# Patient Record
Sex: Female | Born: 1965 | Hispanic: No | Marital: Married | Smoking: Former smoker
Health system: Southern US, Community
[De-identification: ages and names within clinical notes are randomized; demographics above are authoritative.]

## PROBLEM LIST (undated history)

## (undated) DIAGNOSIS — T7840XA Allergy, unspecified, initial encounter: Secondary | ICD-10-CM

## (undated) DIAGNOSIS — Z860101 Personal history of adenomatous and serrated colon polyps: Secondary | ICD-10-CM

## (undated) DIAGNOSIS — R87619 Unspecified abnormal cytological findings in specimens from cervix uteri: Secondary | ICD-10-CM

## (undated) DIAGNOSIS — IMO0002 Reserved for concepts with insufficient information to code with codable children: Secondary | ICD-10-CM

## (undated) DIAGNOSIS — N879 Dysplasia of cervix uteri, unspecified: Secondary | ICD-10-CM

## (undated) DIAGNOSIS — F419 Anxiety disorder, unspecified: Secondary | ICD-10-CM

## (undated) DIAGNOSIS — R51 Headache: Secondary | ICD-10-CM

## (undated) DIAGNOSIS — Z298 Encounter for other specified prophylactic measures: Secondary | ICD-10-CM

## (undated) DIAGNOSIS — Z2989 Encounter for other specified prophylactic measures: Secondary | ICD-10-CM

## (undated) DIAGNOSIS — G43909 Migraine, unspecified, not intractable, without status migrainosus: Secondary | ICD-10-CM

## (undated) DIAGNOSIS — M810 Age-related osteoporosis without current pathological fracture: Secondary | ICD-10-CM

## (undated) HISTORY — DX: Migraine, unspecified, not intractable, without status migrainosus: G43.909

## (undated) HISTORY — DX: Encounter for other specified prophylactic measures: Z29.89

## (undated) HISTORY — DX: Reserved for concepts with insufficient information to code with codable children: IMO0002

## (undated) HISTORY — DX: Age-related osteoporosis without current pathological fracture: M81.0

## (undated) HISTORY — DX: Dysplasia of cervix uteri, unspecified: N87.9

## (undated) HISTORY — DX: Unspecified abnormal cytological findings in specimens from cervix uteri: R87.619

## (undated) HISTORY — DX: Allergy, unspecified, initial encounter: T78.40XA

## (undated) HISTORY — DX: Headache: R51

## (undated) HISTORY — DX: Personal history of adenomatous and serrated colon polyps: Z86.0101

## (undated) HISTORY — PX: OTHER SURGICAL HISTORY: SHX169

## (undated) HISTORY — DX: Anxiety disorder, unspecified: F41.9

## (undated) HISTORY — DX: Encounter for other specified prophylactic measures: Z29.8

---

## 1998-02-03 ENCOUNTER — Other Ambulatory Visit: Admission: RE | Admit: 1998-02-03 | Discharge: 1998-02-03 | Payer: Self-pay | Admitting: Obstetrics and Gynecology

## 1999-05-29 ENCOUNTER — Other Ambulatory Visit: Admission: RE | Admit: 1999-05-29 | Discharge: 1999-05-29 | Payer: Self-pay | Admitting: Obstetrics and Gynecology

## 2000-01-23 HISTORY — PX: OTHER SURGICAL HISTORY: SHX169

## 2000-07-02 ENCOUNTER — Other Ambulatory Visit: Admission: RE | Admit: 2000-07-02 | Discharge: 2000-07-02 | Payer: Self-pay | Admitting: Obstetrics and Gynecology

## 2000-11-13 ENCOUNTER — Ambulatory Visit (HOSPITAL_COMMUNITY): Admission: RE | Admit: 2000-11-13 | Discharge: 2000-11-13 | Payer: Self-pay | Admitting: Obstetrics and Gynecology

## 2002-09-16 ENCOUNTER — Encounter: Payer: Self-pay | Admitting: Otolaryngology

## 2002-09-16 ENCOUNTER — Encounter: Admission: RE | Admit: 2002-09-16 | Discharge: 2002-09-16 | Payer: Self-pay | Admitting: Otolaryngology

## 2005-01-29 ENCOUNTER — Ambulatory Visit: Payer: Self-pay | Admitting: Family Medicine

## 2008-03-17 ENCOUNTER — Ambulatory Visit: Payer: Self-pay | Admitting: Occupational Medicine

## 2008-06-25 ENCOUNTER — Other Ambulatory Visit: Admission: RE | Admit: 2008-06-25 | Discharge: 2008-06-25 | Payer: Self-pay | Admitting: Family Medicine

## 2008-06-25 ENCOUNTER — Encounter: Payer: Self-pay | Admitting: Family Medicine

## 2008-06-25 ENCOUNTER — Ambulatory Visit: Payer: Self-pay | Admitting: Family Medicine

## 2008-06-25 DIAGNOSIS — F909 Attention-deficit hyperactivity disorder, unspecified type: Secondary | ICD-10-CM | POA: Insufficient documentation

## 2008-06-29 LAB — CONVERTED CEMR LAB: Pap Smear: NORMAL

## 2008-07-06 ENCOUNTER — Encounter: Admission: RE | Admit: 2008-07-06 | Discharge: 2008-07-06 | Payer: Self-pay | Admitting: Family Medicine

## 2008-07-06 ENCOUNTER — Encounter: Payer: Self-pay | Admitting: Family Medicine

## 2008-07-07 LAB — CONVERTED CEMR LAB
ALT: 27 units/L (ref 0–35)
AST: 28 units/L (ref 0–37)
Alkaline Phosphatase: 63 units/L (ref 39–117)
CO2: 27 meq/L (ref 19–32)
TSH: 2.368 microintl units/mL (ref 0.350–4.500)
Total Bilirubin: 0.3 mg/dL (ref 0.3–1.2)
Total Protein: 7.1 g/dL (ref 6.0–8.3)

## 2008-10-18 ENCOUNTER — Telehealth: Payer: Self-pay | Admitting: Family Medicine

## 2008-11-19 ENCOUNTER — Ambulatory Visit: Payer: Self-pay | Admitting: Family Medicine

## 2008-11-20 ENCOUNTER — Encounter: Payer: Self-pay | Admitting: Family Medicine

## 2009-03-31 ENCOUNTER — Telehealth: Payer: Self-pay | Admitting: Family Medicine

## 2009-04-04 ENCOUNTER — Ambulatory Visit: Payer: Self-pay | Admitting: Occupational Medicine

## 2009-04-08 ENCOUNTER — Ambulatory Visit: Payer: Self-pay | Admitting: Family Medicine

## 2009-04-25 ENCOUNTER — Ambulatory Visit: Payer: Self-pay | Admitting: Family Medicine

## 2009-04-25 DIAGNOSIS — J309 Allergic rhinitis, unspecified: Secondary | ICD-10-CM | POA: Insufficient documentation

## 2009-05-17 ENCOUNTER — Telehealth: Payer: Self-pay | Admitting: Family Medicine

## 2009-05-26 ENCOUNTER — Telehealth: Payer: Self-pay | Admitting: Family Medicine

## 2009-07-29 ENCOUNTER — Telehealth: Payer: Self-pay | Admitting: Family Medicine

## 2009-09-08 ENCOUNTER — Ambulatory Visit: Payer: Self-pay | Admitting: Family Medicine

## 2009-09-08 DIAGNOSIS — G43009 Migraine without aura, not intractable, without status migrainosus: Secondary | ICD-10-CM | POA: Insufficient documentation

## 2009-09-30 ENCOUNTER — Telehealth: Payer: Self-pay | Admitting: Family Medicine

## 2009-11-08 ENCOUNTER — Telehealth: Payer: Self-pay | Admitting: Family Medicine

## 2009-12-09 ENCOUNTER — Ambulatory Visit: Payer: Self-pay | Admitting: Family Medicine

## 2009-12-22 ENCOUNTER — Telehealth: Payer: Self-pay | Admitting: Family Medicine

## 2010-01-25 ENCOUNTER — Telehealth: Payer: Self-pay | Admitting: Family Medicine

## 2010-02-21 NOTE — Progress Notes (Signed)
Summary: meds  Phone Note Call from Patient Call back at [phone]   Caller: Patient Call For: Nani Gasser MD Summary of Call: Pt called and states the sumatriptan made her feel really "weird" and she didnt like the way she felt while taking this med. If you have another recomendation for a diff med please send to [phone] Initial call taken by: Avon Gully CMA, Duncan Dull),  September 30, 2009 9:32 AM  Follow-up for Phone Call        We can try zomig. Follow-up by: Nani Gasser MD,  September 30, 2009 9:53 AM  Additional Follow-up for Phone Call Additional follow up Details #1::        Pt notified of MD instructions and that med sent Additional Follow-up by: Kathlene November,  September 30, 2009 10:02 AM    New/Updated Medications: ZOMIG 5 MG TABS (ZOLMITRIPTAN) Take 1 tablet by mouth once a day as needed for migraine. can repeat dose in 2 hours as needed. No more than 2 tabs in one day Prescriptions: ZOMIG 5 MG TABS (ZOLMITRIPTAN) Take 1 tablet by mouth once a day as needed for migraine. can repeat dose in 2 hours as needed. No more than 2 tabs in one day  #6 x 0   Entered and Authorized by:   Nani Gasser MD   Signed by:   Nani Gasser MD on 09/30/2009   Method used:   Electronically to        [facility]* (retail)       [address] [place], [place]  [address]       Ph: [phone]       Fax: [phone]   RxID:   [id number]

## 2010-02-21 NOTE — Progress Notes (Signed)
  Phone Note Refill Request Message from:  Patient on May 26, 2009 2:58 PM  Refills Requested: Medication #1:  VYVANSE 30 MG CAPS Take 1 tablet by mouth once a day   Supply Requested: 1 month Call when ready at [phone]   Method Requested: Pick up at Office Initial call taken by: Kathlene November,  May 26, 2009 2:59 PM    Prescriptions: VYVANSE 30 MG CAPS (LISDEXAMFETAMINE DIMESYLATE) Take 1 tablet by mouth once a day  #30 x 0   Entered and Authorized by:   Nani Gasser MD   Signed by:   Nani Gasser MD on 05/26/2009   Method used:   Print then Give to Patient   RxID:   [id number]

## 2010-02-21 NOTE — Progress Notes (Signed)
Summary: Still has H/A  Phone Note Call from Patient Call back at [phone]   Caller: Patient Call For: Nani Gasser MD Summary of Call: Has finished the antibiotic and still taking the omnaris. Still has a terrible H/A and you told her not to take anymore ibuprofen. Please advise as to what to do. Uses [facility] in [place] Initial call taken by: Kathlene November LPN,  December 22, 2009 1:54 PM  Follow-up for Phone Call        Can change ABX if still having nasal sxs. Is the pressure behind her eyes better and the post nasal drip or not? Also can try stopping the omnaris as can cause HA as well.  Follow-up by: Nani Gasser MD,  December 22, 2009 2:09 PM  Additional Follow-up for Phone Call Additional follow up Details #1::        Yes pressure behind eyes and temples. Post nasal drip has cleared Additional Follow-up by: Kathlene November LPN,  December 22, 2009 2:10 PM    Additional Follow-up for Phone Call Additional follow up Details #2::    OK rx sent. Stop omnaris to make sure not contributing to HA.  If stil having HA next week then make OV.  Follow-up by: Nani Gasser MD,  December 22, 2009 2:13 PM  Additional Follow-up for Phone Call Additional follow up Details #3:: Details for Additional Follow-up Action Taken: Pt notified of above instructions and med sent ot pharmacy Additional Follow-up by: Kathlene November LPN,  December 22, 2009 2:15 PM  New/Updated Medications: CEFDINIR 300 MG CAPS (CEFDINIR) Take 1 tablet by mouth two times a day for 10 days Prescriptions: CEFDINIR 300 MG CAPS (CEFDINIR) Take 1 tablet by mouth two times a day for 10 days  #20 x 0   Entered and Authorized by:   Nani Gasser MD   Signed by:   Nani Gasser MD on 12/22/2009   Method used:   Electronically to        [facility]* (retail)       [address] [place], [place]  [address]       Ph: [phone]       Fax: [phone]   RxID:   [id number]

## 2010-02-21 NOTE — Assessment & Plan Note (Signed)
Summary: Sinusitis, allergies   Vital Signs:  Patient profile:   45 year old female Height:      69 inches Weight:      140 pounds BMI:     20.75 O2 Sat:      99 % Temp:     98.4 degrees F oral Pulse rate:   76 / minute BP sitting:   114 / 70  (left arm) Cuff size:   regular  Vitals Entered By: Kathlene November (April 25, 2009 4:05 PM) CC: sinus and facial pressure, H/A for 3 weeks now. Used Mucinex and Advil Cold and Sinus   Primary Care Provider:  Nani Gasser MD  CC:  sinus and facial pressure and H/A for 3 weeks now. Used Mucinex and Advil Cold and Sinus.  History of Present Illness: sinus and facial pressure, H/A for 3 weeks now. Used Mucinex and Advil Cold and Sinus. Does have some year round allergies and uses nasal saline solution.  Throbbing, better when bends forward.  Mild congestion.  Has the ithcy watery eyes as well. Tried zyrtec and allegra.  Works for the [employer] but nto the HA.  Has taken alot of IBU this week and  now getting nauseated. Has also been using Affrin.   Had allergy shots for almost 4 years about 17 years ago and felt they really didn't help. knows mold is one of her triggers.   Current Medications (verified): 1)  Vyvanse 30 Mg Caps (Lisdexamfetamine Dimesylate) .... Take 1 Tablet By Mouth Once A Day  Allergies (verified): No Known Drug Allergies  Comments:  Nurse/Medical Assistant: The patient's medications and allergies were reviewed with the patient and were updated in the Medication and Allergy Lists. Kathlene November (April 25, 2009 4:07 PM)  Physical Exam  General:  Well-developed,well-nourished,in no acute distress; alert,appropriate and cooperative throughout examination Head:  Normocephalic and atraumatic without obvious abnormalities. No apparent alopecia or balding. Eyes:  Lids with midl edema.  Cleary watery eyes.   Ears:  External ear exam shows no significant lesions or deformities.  Otoscopic examination reveals clear canals, tympanic  membranes are intact bilaterally without bulging, retraction, inflammation or discharge. Hearing is grossly normal bilaterally. Nose:  External nasal examination shows no deformity or inflammation. Nasal mucosa are pink and moist without lesions or exudates. Mouth:  Oral mucosa and oropharynx without lesions or exudates.  Teeth in good repair. Neck:  No deformities, masses, or tenderness noted. Lungs:  Normal respiratory effort, chest expands symmetrically. Lungs are clear to auscultation, no crackles or wheezes. Heart:  Normal rate and regular rhythm. S1 and S2 normal without gallop, murmur, click, rub or other extra sounds. Skin:  no rashes.   Cervical Nodes:  No lymphadenopathy noted Psych:  Cognition and judgment appear intact. Alert and cooperative with normal attention span and concentration. No apparent delusions, illusions, hallucinations   Impression & Recommendations:  Problem # 1:  ALLERGIC RHINITIS (ICD-477.9)  Discussed use of allergy medications and environmental measures. REstart her oral antihistamine and can add the decongestant if would like.  Need s to stop the affrin and the IBU today. Will start omnaris and astepro for sxs relieve.  Call if not better in one week.   Her updated medication list for this problem includes:    Astepro 0.15 % Soln (Azelastine hcl) .Marland Kitchen... 1 spray in each nostril two times a day    Omnaris 50 Mcg/act Susp (Ciclesonide) .Marland Kitchen... 2 sprays in each nostril once a day.  Problem #  2:  SINUSITIS, ACUTE (ICD-461.9) Will treat for bacterial sinusitis with amoxicillin. Call if not better in one week.  The following medications were removed from the medication list:    Cephalexin 500 Mg Caps (Cephalexin) ..... One by mouth two times a day Her updated medication list for this problem includes:    Amoxicillin 500 Mg Cap (Amoxicillin) .Marland Kitchen... Take 1 capsule by mouth three times a day x 10 days    Astepro 0.15 % Soln (Azelastine hcl) .Marland Kitchen... 1 spray in each nostril  two times a day    Omnaris 50 Mcg/act Susp (Ciclesonide) .Marland Kitchen... 2 sprays in each nostril once a day.  Complete Medication List: 1)  Vyvanse 30 Mg Caps (Lisdexamfetamine dimesylate) .... Take 1 tablet by mouth once a day 2)  Amoxicillin 500 Mg Cap (Amoxicillin) .... Take 1 capsule by mouth three times a day x 10 days 3)  Astepro 0.15 % Soln (Azelastine hcl) .Marland Kitchen.. 1 spray in each nostril two times a day 4)  Omnaris 50 Mcg/act Susp (Ciclesonide) .... 2 sprays in each nostril once a day.  Patient Instructions: 1)  Omnaris: 2 squirts in each nostril once a day. Tilt chin foreward.  2)  Astepro : one squirt in each nostril twice a day.  3)  Start the zyrtec or allegra.  4)  Call me in one week if not better.  Prescriptions: AMOXICILLIN 500 MG CAP (AMOXICILLIN) Take 1 capsule by mouth three times a day X 10 days  #30 x 0   Entered and Authorized by:   Lacey Gasser MD   Signed by:   Lacey Gasser MD on 04/25/2009   Method used:   Electronically to        [facility]* (retail)       [address] [place], [place]  [address]       Ph: [phone]       Fax: [phone]   RxID:   [id number]

## 2010-02-21 NOTE — Assessment & Plan Note (Signed)
Summary: Sinusitis   Vital Signs:  Patient profile:   45 year old female Height:      69 inches Weight:      144 pounds Temp:     98.5 degrees F Pulse rate:   89 / minute BP sitting:   118 / 71  (right arm) Cuff size:   regular  Vitals Entered By: Avon Gully CMA, Duncan Dull) (December 09, 2009 3:14 PM) CC: sore throat on the left side, drainage, sinus pressurea and HA   Primary Care Provider:  Nani Gasser MD  CC:  sore throat on the left side, drainage, and sinus pressurea and HA.  History of Present Illness: Has been using her omnaris.  Started about 2 weeks ago with sevre nasal congestion. Now her throat is sore, + post nasal drip.   NO fever. No GI sxs.  Bilat frontal and pain behind her eyes. Using mucnex. No cough.   Current Medications (verified): 1)  Vyvanse 30 Mg Caps (Lisdexamfetamine Dimesylate) .... Take 1 Tablet By Mouth Once A Day 2)  Omnaris 50 Mcg/act Susp (Ciclesonide) .... 2 Sprays in Each Nostril Once A Day.  Allergies (verified): No Known Drug Allergies  Comments:  Nurse/Medical Assistant: The patient's medications and allergies were reviewed with the patient and were updated in the Medication and Allergy Lists. Avon Gully CMA, Duncan Dull) (December 09, 2009 3:15 PM)  Physical Exam  General:  Well-developed,well-nourished,in no acute distress; alert,appropriate and cooperative throughout examination Head:  Normocephalic and atraumatic without obvious abnormalities. No apparent alopecia or balding. Eyes:  No corneal or conjunctival inflammation noted. EOMI. Perrla. Ears:  External ear exam shows no significant lesions or deformities.  Otoscopic examination reveals clear canals, tympanic membranes are intact bilaterally without bulging, retraction, inflammation or discharge. Hearing is grossly normal bilaterally. Nose:  External nasal examination shows no deformity or inflammation.  Mouth:  Oral mucosa and oropharynx without lesions or exudates.   Teeth in good repair. Neck:  No deformities, masses, or tenderness noted. Lungs:  Normal respiratory effort, chest expands symmetrically. Lungs are clear to auscultation, no crackles or wheezes. Heart:  Normal rate and regular rhythm. S1 and S2 normal without gallop, murmur, click, rub or other extra sounds. Skin:  no rashes.   Cervical Nodes:  No lymphadenopathy noted Psych:  Cognition and judgment appear intact. Alert and cooperative with normal attention span and concentration. No apparent delusions, illusions, hallucinations   Impression & Recommendations:  Problem # 1:  SINUSITIS - ACUTE-NOS (ICD-461.9)  Her updated medication list for this problem includes:    Omnaris 50 Mcg/act Susp (Ciclesonide) .Marland Kitchen... 2 sprays in each nostril once a day.    Amoxicillin 875 Mg Tabs (Amoxicillin) .Marland Kitchen... Take 1 tablet by mouth two times a day for 10 day  Instructed on treatment. Call if symptoms persist or worsen.   Complete Medication List: 1)  Vyvanse 30 Mg Caps (Lisdexamfetamine dimesylate) .... Take 1 tablet by mouth once a day 2)  Omnaris 50 Mcg/act Susp (Ciclesonide) .... 2 sprays in each nostril once a day. 3)  Amoxicillin 875 Mg Tabs (Amoxicillin) .... Take 1 tablet by mouth two times a day for 10 day  Patient Instructions: 1)  Call if not better in one week 2)  Can add nasal saline rinse once daily if would like.  Prescriptions: AMOXICILLIN 875 MG TABS (AMOXICILLIN) Take 1 tablet by mouth two times a day for 10 day  #20 x 0   Entered and Authorized by:   Nani Gasser MD  Signed by:   Nani Gasser MD on 12/09/2009   Method used:   Electronically to        [facility]* (retail)       [address] [place], [place]  [address]       Ph: [phone]       Fax: [phone]   RxID:   [id number]    Orders Added: 1)  Est. Patient Level III [25366]

## 2010-02-21 NOTE — Progress Notes (Signed)
Summary: med. request   Phone Note Refill Request Message from:  Patient on July 29, 2009 3:06 PM  Refills Requested: Medication #1:  VYVANSE 30 MG CAPS Take 1 tablet by mouth once a day Call patient at [phone] and let her know when this med is ready for pick up... She had requested it at the beginning of the week on kim's voice mail and didn't realize Selena Batten is not here this week. Thanks.Michaelle Copas  July 29, 2009 3:08 PM   Initial call taken by: Michaelle Copas,  July 29, 2009 3:08 PM    Prescriptions: VYVANSE 30 MG CAPS (LISDEXAMFETAMINE DIMESYLATE) Take 1 tablet by mouth once a day  #30 x 0   Entered and Authorized by:   Seymour Bars DO   Signed by:   Seymour Bars DO on 07/29/2009   Method used:   Print then Give to Patient   RxID:   [id number]

## 2010-02-21 NOTE — Progress Notes (Signed)
Summary: refill  Phone Note Refill Request Message from:  Patient on March 31, 2009 3:22 PM  Refills Requested: Medication #1:  VYVANSE 30 MG CAPS Take 1 tablet by mouth once a day   Supply Requested: 1 month  Method Requested: Pick up at Office Initial call taken by: Kathlene November,  March 31, 2009 3:23 PM    Prescriptions: VYVANSE 30 MG CAPS (LISDEXAMFETAMINE DIMESYLATE) Take 1 tablet by mouth once a day  #30 x 0   Entered and Authorized by:   Nani Gasser MD   Signed by:   Nani Gasser MD on 03/31/2009   Method used:   Printed then faxed to ...       [address] [phone]* (retail)       [address] [place], [place]  [address]       Ph: [phone]       Fax: [phone]   RxID:   [id number]

## 2010-02-21 NOTE — Assessment & Plan Note (Signed)
Summary: migraines   Vital Signs:  Patient profile:   45 year old female Height:      69 inches Weight:      143 pounds Pulse rate:   78 / minute BP sitting:   116 / 69  (left arm) Cuff size:   regular  Vitals Entered By: Kathlene November (September 08, 2009 1:30 PM) CC: followup H/A. Having 3 a week. Brought H/A diary, Headache   Primary Care Provider:  Nani Gasser MD  CC:  followup H/A. Having 3 a week. Brought H/A diary and Headache.  History of Present Illness: Felt like having chronic Sinus HA. Sine the 21st of July has had 11 HA's. Brought in a HA diary with her.  2 of the HA made her nauseated adn sick.  Usually  from nose to teh occiput.  Feels better to put pressure on her head. They do throb. Has been trying the allergy meds (omnaris, prednisone and ABX) and the HA are not better. No more nasal congestion. Most  HA are around an 8/10.  Yesterday had HA and took and OTC medicine and then again later in the day.  Denies any aura sxss. Light and sound senitivity. Only uses OTC meds about twice a week.  Sleeps well.  Wine maybe once  a month. No chocolate. dosn't feel stressed right now. Drinkon caffeinated drink a day. Regular exercise.   Current Medications (verified): 1)  Vyvanse 30 Mg Caps (Lisdexamfetamine Dimesylate) .... Take 1 Tablet By Mouth Once A Day 2)  Omnaris 50 Mcg/act Susp (Ciclesonide) .... 2 Sprays in Each Nostril Once A Day.  Allergies (verified): No Known Drug Allergies  Comments:  Nurse/Medical Assistant: The patient's medications and allergies were reviewed with the patient and were updated in the Medication and Allergy Lists. Kathlene November (September 08, 2009 1:32 PM)  Family History: _Adopted Son with HA  Social History: Reviewed history from 06/25/2008 and no changes required. 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  Married to [employer] with 2 children.  Quit smoking 5 yrs ago, occ EtOH, 2 caffeinated drinks per day, Yoga for  exercise.  Physical Exam  General:  Well-developed,well-nourished,in no acute distress; alert,appropriate and cooperative throughout examination Neurologic:  alert & oriented X3, cranial nerves II-XII intact, and gait normal.   Psych:  Oriented X3.     Impression & Recommendations:  Problem # 1:  COMMON MIGRAINE (ICD-346.10) Assessment New Discussed that I do think she has migraines. Talked about prophylaxis since she is having so much frequency and resuce meds. OTC do work well for her but I am fearful or getting rebounds it using too frequently.  Dsicused risks and benefits and how to properly use th emedications. Will start with nortryptiline nightly and use imitrex for rescure for her more severe HA F/U in 6 weeks. she really already has low stress, great diet, works out regularly. Can work on dec caffeine overall. She already avoid alcohol and chocolate. Can try avoiding nuts and look for triggers.  Her updated medication list for this problem includes:    Sumatriptan Succinate 100 Mg Tabs (Sumatriptan succinate) ..... One by mouth as needed migraine. can repat the dose in 2 hours if needed.  Complete Medication List: 1)  Vyvanse 30 Mg Caps (Lisdexamfetamine dimesylate) .... Take 1 tablet by mouth once a day 2)  Omnaris 50 Mcg/act Susp (Ciclesonide) .... 2 sprays in each nostril once a day. 3)  Nortriptyline Hcl 25 Mg Caps (Nortriptyline hcl) .Marland KitchenMarland KitchenMarland Kitchen  Take 1 tablet by mouth once a day at bedtime 4)  Sumatriptan Succinate 100 Mg Tabs (Sumatriptan succinate) .... One by mouth as needed migraine. can repat the dose in 2 hours if needed.  Patient Instructions: 1)  Start the nortryptiline at bedtime. Can use the sumatriptan for acute headaches.  2)  Please schedule a follow-up appointment in 4-6 weeks.  Prescriptions: SUMATRIPTAN SUCCINATE 100 MG TABS (SUMATRIPTAN SUCCINATE) one by mouth as needed migraine. Can repat the dose in 2 hours if needed.  #6 x 0   Entered and Authorized by:    Nani Gasser MD   Signed by:   Nani Gasser MD on 09/08/2009   Method used:   Electronically to        [facility]* (retail)       [address] [place], [place]  [address]       Ph: [phone]       Fax: [phone]   RxID:   [id number] NORTRIPTYLINE HCL 25 MG CAPS (NORTRIPTYLINE HCL) Take 1 tablet by mouth once a day at bedtime  #30 x 1   Entered and Authorized by:   Nani Gasser MD   Signed by:   Nani Gasser MD on 09/08/2009   Method used:   Electronically to        [facility]* (retail)       [address] [place], [place]  [address]       Ph: [phone]       Fax: [phone]   RxID:   [id number]

## 2010-02-21 NOTE — Progress Notes (Signed)
Summary: refill vyvanse  Phone Note Refill Request Call back at [phone] Message from:  Patient on November 08, 2009 9:13 AM  Refills Requested: Medication #1:  VYVANSE 30 MG CAPS Take 1 tablet by mouth once a day   Supply Requested: 1 month Please call pt at [phone] when ready to pick up   Method Requested: Pick up at Office Initial call taken by: Kathlene November LPN,  November 08, 2009 9:13 AM  Follow-up for Phone Call        Pt notified ready to pick up. KJ LPN Follow-up by: Kathlene November LPN,  November 08, 2009 10:29 AM    Prescriptions: VYVANSE 30 MG CAPS (LISDEXAMFETAMINE DIMESYLATE) Take 1 tablet by mouth once a day  #30 x 0   Entered and Authorized by:   Nani Gasser MD   Signed by:   Nani Gasser MD on 11/08/2009   Method used:   Print then Give to Patient   RxID:   [id number]

## 2010-02-21 NOTE — Assessment & Plan Note (Signed)
Summary: DOG BITE ON LIP   Vital Signs:  Patient Profile:   45 Years Old Female CC:      Dog bite upper lip Height:     69 inches Weight:      140 pounds O2 Sat:      100 % O2 treatment:    Room Air Temp:     97.9 degrees F oral Pulse rate:   71 / minute Pulse rhythm:   regular Resp:     16 per minute BP sitting:   111 / 75  (right arm) Cuff size:   regular  Vitals Entered By: Emilio Math (April 04, 2009 12:58 PM)                  Current Allergies: No known allergies History of Present Illness Chief Complaint: Dog bite upper lip History of Present Illness: Very pleastnt lady was playing with her 85 lb lab and his tooth accidentally hit her left upper lip.  Presents with a 1 cm lacertion to her upper lip left side.   Tdap is up to date.    REVIEW OF SYSTEMS Constitutional Symptoms      Denies fever, chills, night sweats, weight loss, weight gain, and fatigue.  Eyes       Denies change in vision, eye pain, eye discharge, glasses, contact lenses, and eye surgery. Ear/Nose/Throat/Mouth       Denies hearing loss/aids, change in hearing, ear pain, ear discharge, dizziness, frequent runny nose, frequent nose bleeds, sinus problems, sore throat, hoarseness, and tooth pain or bleeding.  Respiratory       Denies dry cough, productive cough, wheezing, shortness of breath, asthma, bronchitis, and emphysema/COPD.  Cardiovascular       Denies murmurs, chest pain, and tires easily with exhertion.    Gastrointestinal       Denies stomach pain, nausea/vomiting, diarrhea, constipation, blood in bowel movements, and indigestion. Genitourniary       Denies painful urination, kidney stones, and loss of urinary control. Neurological       Denies paralysis, seizures, and fainting/blackouts. Musculoskeletal       Denies muscle pain, joint pain, joint stiffness, decreased range of motion, redness, swelling, muscle weakness, and gout.      Comments: LipLac Skin       Denies bruising,  unusual mles/lumps or sores, and hair/skin or nail changes.  Psych       Denies mood changes, temper/anger issues, anxiety/stress, speech problems, depression, and sleep problems.  Past History:  Past Medical History: Reviewed history from 10/30/2005 and no changes required. Hx allergy shots  Past Surgical History: Reviewed history from 06/25/2008 and no changes required. Gyn surgery 2002  Family History: Reviewed history from 03/17/2008 and no changes required. 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  Quit smoking 5 yrs ago, occ EtOH, 2 caffeinated drinks per day, Yoga for exercise. Physical Exam General appearance: well developed, well nourished, no acute distress Oral/Pharynx: 1 cm lacertion to her upper lip left side.   Fairly superficial.  Gaping noted.   Nothing through and through.  Chest/Lungs: no rales, wheezes, or rhonchi bilateral, breath sounds equal without effort Heart: regular rate and  rhythm, no murmur Assessment New Problems: WOUND, OPEN, HEAD/NECK/TRUNK, UNSPEC. (ICD-879.8)   Plan New Orders: Repair Superfical Wound(s) 2.5cm or< (face,ears,eyelids,nose,lips,mm) [12011] Planning Comments:   I anestizied the wound  with 2% lidocaine Closed the wound with 3 interupted sutures 6.0 nylon Follow up Friday for suture removal.   The patient and/or caregiver has been counseled thoroughly with regard to medications prescribed including dosage, schedule, interactions, rationale for use, and possible side effects and they verbalize understanding.  Diagnoses and expected course of recovery discussed and will return if not improved as expected or if the condition worsens. Patient and/or caregiver verbalized understanding.

## 2010-02-21 NOTE — Assessment & Plan Note (Signed)
Summary: Suture Removal  followup  Suture removal: left upper lip BP 107/66 HR 78 Temp 97.0 RR: 16  Subjective:  Patient has no complaints  Objective: Laceration left upper lip:  healing well.  Sutures removed.  No evidence infection.  Wound edges appear somewhat friable.  Assessment:  ENCOUNTER FOR REMOVAL OF SUTURES (ICD-V58.32)   Plan:   Reinforced wound with Dermabond Dermabond info sheet given

## 2010-02-21 NOTE — Progress Notes (Signed)
Summary: headaches  Phone Note Call from Patient Call back at [phone]   Caller: Patient Call For: Lacey Gasser MD Summary of Call: Pt calls and states given antibiotic and 2 nasal sprays for sinus infection at her appointment- still having a terrible Headache and wanted to know if you could call in the Omnaris nasal spray and something for her headaches since you told her not to take the ibuprofen and doesn't want to keep taking so much Tylenol either Initial call taken by: Kathlene November,  May 17, 2009 11:33 AM  Follow-up for Phone Call        Are her sinus sxs better?  Will call in prednisone for 5 days.  DUring those 5 days don't take any IBU, Aleve, or Tylenol.  Hopefully this will break the HA cycyle. If not then come back in for OV>  Follow-up by: Lacey Gasser MD,  May 17, 2009 11:45 AM  Additional Follow-up for Phone Call Additional follow up Details #1::        Pt notified of MD instructions. Additional Follow-up by: Kathlene November,  May 17, 2009 11:54 AM    New/Updated Medications: PREDNISONE 10 MG TABS (PREDNISONE) 8 tabs today, 6 tabs by mouth Day 2, 4 tabs Day 3, 2 tabs Day 4, 1 tab Day 5, then stop. Prescriptions: PREDNISONE 10 MG TABS (PREDNISONE) 8 tabs today, 6 tabs by mouth Day 2, 4 tabs Day 3, 2 tabs Day 4, 1 tab Day 5, then stop.  #21 x 0   Entered and Authorized by:   Lacey Gasser MD   Signed by:   Lacey Gasser MD on 05/17/2009   Method used:   Electronically to        [facility]* (retail)       [address] [place], [place]  [address]       Ph: [phone]       Fax: [phone]   RxID:   [id number] OMNARIS 50 MCG/ACT SUSP (CICLESONIDE) 2 sprays in each nostril once a day.  #1 x 3   Entered and Authorized by:   Lacey Gasser MD   Signed by:   Lacey Gasser MD on 05/17/2009   Method used:   Electronically to        [facility]* (retail)       [address] [place], [place]  [address]       Ph:  [phone]       Fax: [phone]   RxID:   [id number]

## 2010-02-23 NOTE — Progress Notes (Signed)
Summary: Vyvanse refill  Phone Note Refill Request Message from:  Patient on January 25, 2010 10:49 AM  Refills Requested: Medication #1:  VYVANSE 30 MG CAPS Take 1 tablet by mouth once a day   Supply Requested: 1 month  Method Requested: Pick up at Office Initial call taken by: Kathlene November LPN,  January 25, 2010 10:49 AM    Prescriptions: VYVANSE 30 MG CAPS (LISDEXAMFETAMINE DIMESYLATE) Take 1 tablet by mouth once a day  #30 x 0   Entered and Authorized by:   Nani Gasser MD   Signed by:   Nani Gasser MD on 01/25/2010   Method used:   Print then Give to Patient   RxID:   [id number]

## 2010-04-03 ENCOUNTER — Ambulatory Visit: Payer: Self-pay | Admitting: Family Medicine

## 2010-04-04 ENCOUNTER — Ambulatory Visit (INDEPENDENT_AMBULATORY_CARE_PROVIDER_SITE_OTHER): Payer: PRIVATE HEALTH INSURANCE | Admitting: Family Medicine

## 2010-04-04 ENCOUNTER — Encounter: Payer: Self-pay | Admitting: Family Medicine

## 2010-04-04 DIAGNOSIS — N943 Premenstrual tension syndrome: Secondary | ICD-10-CM

## 2010-04-04 DIAGNOSIS — N920 Excessive and frequent menstruation with regular cycle: Secondary | ICD-10-CM | POA: Insufficient documentation

## 2010-04-04 DIAGNOSIS — F3281 Premenstrual dysphoric disorder: Secondary | ICD-10-CM | POA: Insufficient documentation

## 2010-04-11 NOTE — Assessment & Plan Note (Signed)
Summary: trouble with menses,  mood   Vital Signs:  Patient profile:   45 year old female Height:      69 inches Weight:      142 pounds Pulse rate:   90 / minute BP sitting:   132 / 81  (right arm) Cuff size:   regular  Vitals Entered By: Avon Gully CMA, Duncan Dull) (April 04, 2010 2:56 PM) CC: heavy periods, and very irritable   Primary Care Provider:  Nani Gasser MD  CC:  heavy periods and and very irritable.  History of Present Illness: heavy periods, and very irritable.  In December started to get more irritable.  using pad and tampon becuase passing blood clots. Bleeding for about 4- 7 days. Lot sof cramping.  Very ittitable the week before and after her periods. Sister takes Buspar for PMS.  Did stop her ADD med just to see if felt better and ther was no change.  Feelsll ike might have a nervous breakdown.  she says her husband is also getting irritated with her and recommended she come speak to the physician.  Current Medications (verified): 1)  Vyvanse 30 Mg Caps (Lisdexamfetamine Dimesylate) .... Take 1 Tablet By Mouth Once A Day  Allergies (verified): 1)  Sumatriptan Succinate (Sumatriptan Succinate) 2)  Zomig (Zolmitriptan)  Comments:  Nurse/Medical Assistant: The patient's medications and allergies were reviewed with the patient and were updated in the Medication and Allergy Lists. 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  Married to [employer] with 2 children.  Quit smoking 5 yrs ago, occ EtOH, 2 caffeinated drinks per day, Yoga for exercise.  Physical Exam  General:  Well-developed,well-nourished,in no acute distress; alert,appropriate and cooperative throughout examination   Impression &  Recommendations:  Problem # 1:  EXCESSIVE MENSTRUAL BLEEDING (ICD-626.2) Will refer to GYN.   her last Pap smear was normal in June of 2010. She was not due to repeat  her Pap until June of this year. Because she is over 35 with a recent increase in heaviness of her periods she might be a candidate for an endometrial biopsy to rule out hyperplasia. I will leave that to GYN. Certainly they can go ahead and do a Pap smear while she is there as well as STD testing. She is low risk for STI's. 25 minutes spent face-to-face counseling about these 2 conditions.  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 I discussed that the typical treatment as SSRIs. I typically does favor BuSpar because of its potential side effects. We also discussed that she could consider hormone therapy as well such as oral contraceptives or an IUD. since she is having symptoms 3 weeks out of the month I recommend we will start with a trial of an SSRI. We discussed the potential side effects and risks of these medications including suicidality. we will start low dose and follow her up in  4-6 weeks to see how she's doing.  Complete Medication List: 1)  Vyvanse 30 Mg Caps (Lisdexamfetamine dimesylate) .... Take 1 tablet by mouth once a day 2)  Fluoxetine Hcl 20 Mg Tabs (Fluoxetine hcl) .... 1/2 tab daily by mouth for one week, then increase to whole tab.  Patient Instructions:  1)  Please schedule a follow-up appointment in 4-6 weeks.  Prescriptions: VYVANSE 30 MG CAPS (LISDEXAMFETAMINE DIMESYLATE) Take 1 tablet by mouth once a day  #30 x 0   Entered and Authorized by:   Nani Gasser MD   Signed by:   Nani Gasser MD on 04/04/2010   Method used:   Print then Give to Patient   RxID:   [id number] FLUOXETINE HCL 20 MG TABS (FLUOXETINE HCL) 1/2 tab daily by mouth for one week, then increase to whole tab.  #30 x 1   Entered and Authorized by:   Nani Gasser  MD   Signed by:   Nani Gasser MD on 04/04/2010   Method used:   Electronically to        [facility]* (retail)       [address] [place], [place]  [address]       Ph: [phone]       Fax: [phone]   RxID:   [id number]    Orders Added: 1)  Est. Patient Level IV [94854]

## 2010-04-17 ENCOUNTER — Telehealth: Payer: Self-pay | Admitting: Family Medicine

## 2010-04-17 DIAGNOSIS — N920 Excessive and frequent menstruation with regular cycle: Secondary | ICD-10-CM

## 2010-04-17 NOTE — Telephone Encounter (Signed)
It was printed adn given to hre at the OV. It wasn't called in. It was on the same rx as the fluoxetine. We can call the pharmacy to verify.

## 2010-04-17 NOTE — Telephone Encounter (Signed)
Pt called and states we were going to set her up with a GYN for an issue and also need rx for vyvanse because it was called in with other rx and pt needs to come pick it up

## 2010-04-19 MED ORDER — LISDEXAMFETAMINE DIMESYLATE 30 MG PO CAPS: 30.0000 mg | ORAL_CAPSULE | ORAL | Status: DC

## 2010-04-19 NOTE — Telephone Encounter (Signed)
Called pharm and they did not have rx for vyvanse and they state they filled the fluoxetine because it was sent thru escripts

## 2010-04-19 NOTE — Telephone Encounter (Signed)
LMOM

## 2010-04-19 NOTE — Telephone Encounter (Signed)
Ok to pick up vyvanse rx.

## 2010-05-09 ENCOUNTER — Encounter: Payer: PRIVATE HEALTH INSURANCE | Admitting: Obstetrics & Gynecology

## 2010-06-01 ENCOUNTER — Ambulatory Visit (INDEPENDENT_AMBULATORY_CARE_PROVIDER_SITE_OTHER): Payer: PRIVATE HEALTH INSURANCE | Admitting: Family Medicine

## 2010-06-01 VITALS — BP 110/75 | HR 49 | Ht 69.5 in | Wt 140.0 lb

## 2010-06-01 DIAGNOSIS — N63 Unspecified lump in unspecified breast: Secondary | ICD-10-CM

## 2010-06-01 NOTE — Progress Notes (Signed)
  Subjective:    Patient ID: Lacey Jensen, female    DOB: 12/26/1965, 45 y.o.   MRN: [id number]  HPI Here because she noticed a lump in her right breast about 2 days ago. Not tender or painful.  Says usually checks her breast after her periods and didn't remember feeling anything last time.  She is worried about it.  No family or personal hx of brCa. Had a screening mammo at age 80 at [place].  No rash or fever.    Review of Systems     Objective:   Physical Exam  Pulmonary/Chest:         approx 1.5 x 2.0 cm mobile firm lesion in thr right breast in the 7 o'clock position. Mild tender with palpation. No overlying rash.           Assessment & Plan:  Breast lump-most likely a breast cyst. Will refer to the breast Center in [place] for further evaluation with ultrasound diagnostic mammogram since she is 45 years old. She is not having pain and did not recommend any pain regimen. We will call her as soon as we have the results.

## 2010-06-01 NOTE — Patient Instructions (Signed)
[facility] should call you with the appointment.

## 2010-06-02 ENCOUNTER — Encounter: Payer: Self-pay | Admitting: Family Medicine

## 2010-06-07 ENCOUNTER — Other Ambulatory Visit: Payer: Self-pay | Admitting: Family Medicine

## 2010-06-07 ENCOUNTER — Ambulatory Visit
Admission: RE | Admit: 2010-06-07 | Discharge: 2010-06-07 | Disposition: A | Discharge status: Home / Self Care | Payer: PRIVATE HEALTH INSURANCE | Source: Ambulatory Visit | Attending: Family Medicine | Admitting: Family Medicine

## 2010-06-07 DIAGNOSIS — N63 Unspecified lump in unspecified breast: Secondary | ICD-10-CM

## 2010-06-08 ENCOUNTER — Telehealth: Payer: Self-pay | Admitting: Family Medicine

## 2010-06-08 NOTE — Telephone Encounter (Signed)
Left message on cell with results.

## 2010-06-08 NOTE — Telephone Encounter (Signed)
Call pt: mamm shows looks like a cyst. They recommend repeat the study in 6 months to make sure no changes.

## 2010-06-09 NOTE — Op Note (Signed)
[facility]  Patient:    Lacey Jensen, Lacey Jensen Visit Number: [id number] MRN: [id number]          Service Type: DSU Location: [facility] Attending Physician:  Wandalee Ferdinand Dictated by:   Rudy Jew Ashley Royalty, M.D. Proc. Date: 11/13/00 Admit Date:  11/13/2000                             Operative Report  PREOPERATIVE DIAGNOSES:       Desire for attempt at permanent sterilization.  POSTOPERATIVE DIAGNOSES:      1. Desire for attempt at permanent                                  sterilization.                               2. 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  PROCEDURE:                    1. Diagnostic/operative laparoscopy.                               2. Bilateral tubal sterilization procedure                                  (Falope rings).                               3. Fulguration of pigmented lesion of posterior                                  cul-de-sac.  SURGEON:                      Rudy Jew. Ashley Royalty, M.D.  ANESTHESIA:                   General endotracheal.  ESTIMATED BLOOD LOSS:         Less than 50 cc.  COMPLICATIONS:                None.  PACKS AND DRAINS:             None.  PROCEDURE:                    Patient was taken to the operating room and placed in the dorsal supine position.  After adequate general anesthesia was administered she was placed in the lithotomy position and prepped and draped in the usual manner for abdominal and vaginal surgery.  Posterior weighted retractor was placed per vagina.  The anterior lip of the cervix was grasped with a single tooth tenaculum.  Jarco uterine manipulator was placed per cervix and held in place with tenaculum.  Bladder was drained with a Red rubber catheter.  Next, approximately 3 cc of 0.25% Marcaine was instilled into the inferior aspect  of the umbilicus and suprapubic area for later incisions.  Incision was made in the umbilicus of  approximately 1.2 cm in the longitudinal plane.  Subcutaneous tissues were sharply dissected.  The disposable size 10-11 trocar was placed into the abdominal cavity.  Location was verified by placement of the laparoscope.  There was no evidence of any trauma.  Next, a pneumoperitoneum was created with CO2 and maintained throughout the procedure.  A size 7 mm Falope ring trocar was then placed in the midline suprapubically.  Direct visualization and transillumination techniques were used in order to minimize the chance of any trauma.  The pelvis was thoroughly surveyed.  The uterus was normal size, shape, and contour without evidence of any endometriosis or fibroids.  The fallopian tubes were normal size, shape, length bilaterally with luxurian fimbria.  The left ovary was normal size with a stigma present on it.  The right ovary was normal size without evidence of any cysts or endometriosis.  Anterior cul-de-sac was clear.  In the posterior cul-de-sac there was a solitary approximately 3-4 mm pigmented area consistent with, but not diagnostic of, endometriosis.  The remainder of the peritoneal surfaces were smooth and glistening.  Next, the right fallopian tube was grasped with the Falope ring applicator in the distal isthmic to proximal ampullary portion.  A Falope ring was applied without difficulty.  Excellent knuckle of tube was noted to be contained within the ring.  Excellent blanching of tissue was noted.  The identical procedure was done on the left side at the identical location on the fallopian tube.  An excellent knuckle of tube was noted to be contained within the ring.  Excellent blanching of tissue was noted.  Appropriate photos were obtained before and after.  At this point the abdominal instruments were removed and pneumoperitoneum evacuated.  Fascial defect closed with 0 Vicryl in an interrupted fashion. The skin was closed with 3-0 Chromic in a subcuticular fashion.   Approximately 5 additional cc of 0.25% Marcaine was instilled.  The vaginal instruments were removed, hemostasis noted, and the procedure terminated.  Patient was taken to the recovery room in excellent condition. Dictated by:   Rudy Jew Ashley Royalty, M.D. Attending Physician:  Wandalee Ferdinand DD:  11/13/00 TD:  11/14/00 Job: [id number]

## 2010-06-09 NOTE — H&P (Signed)
[facility]  Patient:    KETURA, SIREK Visit Number: [id number] MRN: [id number]          Service Type: DSU Location: [facility] Attending Physician:  Wandalee Ferdinand Dictated by:   Rudy Jew Ashley Royalty, M.D.                           History and Physical  HISTORY OF PRESENT ILLNESS:   This is a 45 year old gravida 3, para 2, abortus 1, who states a desire for attempt at permanent sterilization.  She is also status post cold-knife conization.  Recent Pap smears have been negative.  MEDICATIONS:                  None.  PAST MEDICAL HISTORY:         Negative.  PAST SURGICAL HISTORY:        1. Cold-knife conization.                               2. Therapeutic AB.  ALLERGIES:                    None.  FAMILY HISTORY:               Positive for uterine cancer (vague history).  SOCIAL HISTORY:               The patient denies use of tobacco or alcohol.  REVIEW OF SYSTEMS:            Noncontributory.  PHYSICAL EXAMINATION:  GENERAL:                      Well-developed, well-nourished, pleasant white female in no acute distress.  VITAL SIGNS:                  Afebrile, vital signs stable.  SKIN:                         Warm and dry, without lesions.  LYMPH:                        No supraclavicular, cervical, or inguinal adenopathy.  HEENT:                        Normocephalic.  NECK:                         Supple.  Without thyromegaly.  CHEST:                        Clear.  LUNGS:                        Clear.  CARDIAC:                      Regular rate and rhythm without murmurs, gallops, or rubs.  BREASTS:                      Deferred.  ABDOMEN:                      Soft and nontender.  Without masses or organomegaly.  Bowel sounds  are active.  MUSCULOSKELETAL:              Full range of motion.  Without edema, cyanosis, or CVA tenderness.  PELVIC:                       Deferred until examination under  anesthesia.  IMPRESSION:                   Desire for attempt at permanent surgical sterilization.  PLAN:                         Laparoscopic bilateral tubal sterilization procedure.  Risks, benefits, complications, and alternatives were fully discussed with the patient.  Permanency and failure rate of various techniques including, but not limited to ______, minilaparotomy, and partial salpingectomy and Falope rings, were discussed and accepted.  Questions invited and answered. Dictated by:   Rudy Jew Ashley Royalty, M.D. Attending Physician:  Wandalee Ferdinand DD:  11/13/00 TD:  11/13/00 Job: [id number]

## 2010-07-17 ENCOUNTER — Telehealth: Payer: Self-pay | Admitting: Family Medicine

## 2010-07-17 NOTE — Telephone Encounter (Signed)
Pt called and left her name and number for triage nurse to call her.  Plan:  Pt call returned.  LMOM to call back at her convenience. Jarvis Newcomer, LPN [facility]

## 2010-07-18 ENCOUNTER — Telehealth: Payer: Self-pay | Admitting: Family Medicine

## 2010-07-18 MED ORDER — FLUOXETINE HCL 20 MG PO TABS: 20.0000 mg | ORAL_TABLET | Freq: Every day | ORAL | Status: DC

## 2010-07-18 MED ORDER — LISDEXAMFETAMINE DIMESYLATE 30 MG PO CAPS: 30.0000 mg | ORAL_CAPSULE | ORAL | Status: DC

## 2010-07-18 NOTE — Telephone Encounter (Signed)
Pt needs refill for vyvanse and fluoxetine 20 mg.  Fluoxetine sent to the pharmacy for # 30/2 refills, and vyvanse was printed out for sig from Dr. Cathey Endow.  Dr. Linford Arnold out of office.  Will get sig on Vyvanse and place up front for pt to pup.  Will check on the status of pt. Referral, and pt will ask about the referral today around 4 pm when she picks up her vyvanse prescription. Jarvis Newcomer, LPN [facility]  Victorino Dike please check on the status of pt referral.  She will ask you about when she comes to pup her script at 4 pm today. Jarvis Newcomer, LPN [facility]

## 2010-08-22 ENCOUNTER — Encounter: Payer: PRIVATE HEALTH INSURANCE | Admitting: Obstetrics & Gynecology

## 2010-09-12 ENCOUNTER — Encounter: Payer: PRIVATE HEALTH INSURANCE | Admitting: Obstetrics & Gynecology

## 2010-09-14 ENCOUNTER — Telehealth: Payer: Self-pay | Admitting: Family Medicine

## 2010-09-14 MED ORDER — LISDEXAMFETAMINE DIMESYLATE 30 MG PO CAPS: 30.0000 mg | ORAL_CAPSULE | ORAL | Status: DC

## 2010-09-14 NOTE — Telephone Encounter (Signed)
Pt called and needs refill of her vyvanse 30 mg PO QD.  Recent office visit on file. Plan:  Vyvanse 30 mg PO QD #30/0 refills printed out and provider to sign.  Pt will be notified to pup the script. Jarvis Newcomer, LPN [facility]'

## 2010-10-04 ENCOUNTER — Other Ambulatory Visit: Payer: Self-pay | Admitting: Obstetrics & Gynecology

## 2010-10-04 ENCOUNTER — Ambulatory Visit (INDEPENDENT_AMBULATORY_CARE_PROVIDER_SITE_OTHER): Payer: PRIVATE HEALTH INSURANCE | Admitting: Obstetrics & Gynecology

## 2010-10-04 ENCOUNTER — Encounter: Payer: Self-pay | Admitting: Obstetrics & Gynecology

## 2010-10-04 VITALS — BP 121/75 | HR 80 | Temp 97.5°F | Resp 12 | Ht 69.0 in | Wt 144.0 lb

## 2010-10-04 DIAGNOSIS — N920 Excessive and frequent menstruation with regular cycle: Secondary | ICD-10-CM

## 2010-10-04 DIAGNOSIS — R51 Headache: Secondary | ICD-10-CM

## 2010-10-04 MED ORDER — CYCLOBENZAPRINE HCL 10 MG PO TABS: 10.0000 mg | ORAL_TABLET | Freq: Three times a day (TID) | ORAL | Status: DC | PRN

## 2010-10-04 MED ORDER — BACLOFEN 10 MG PO TABS: 10.0000 mg | ORAL_TABLET | Freq: Three times a day (TID) | ORAL | Status: DC

## 2010-10-04 MED ORDER — HYDROCODONE-ACETAMINOPHEN 7.5-750 MG PO TABS: 1.0000 | ORAL_TABLET | Freq: Four times a day (QID) | ORAL | Status: DC | PRN

## 2010-10-04 MED ORDER — NORGESTIMATE-ETH ESTRADIOL 0.25-35 MG-MCG PO TABS: 1.0000 | ORAL_TABLET | Freq: Every day | ORAL | Status: DC

## 2010-10-04 NOTE — Progress Notes (Signed)
  Subjective:    Patient ID: Lacey Jensen, female    DOB: May 10, 1965, 45 y.o.   MRN: [id number]  HPI Pt complaining of headaches--15/mopnth, worse 2 weeks before period and at start of menses.  Triptans are not tolerated by the pt.  Pt has complaints of muscle tension, headaches are associated with nausea at times, pt has problems sleeping.  PT has heavy periods for past several yrs.  No bleeding b/t menses.  +dysmenorrhea.  Wears pad and tampon and sleeps on towel first 3 nights.     Review of Systems  Constitutional: Negative.   Respiratory: Positive for wheezing.   Gastrointestinal: Negative.   Genitourinary: Positive for menstrual problem.  Musculoskeletal: Negative.   Neurological: Positive for headaches.  Psychiatric/Behavioral: Negative.        Objective:   Physical Exam  Constitutional: She is oriented to person, place, and time. She appears well-developed and well-nourished. No distress.  HENT:  Head: Normocephalic and atraumatic.  Neck: No thyromegaly present.       Tense muscles in neck and trapezius  Cardiovascular: Normal rate and regular rhythm.   Abdominal: Soft. She exhibits no mass. There is no tenderness. There is no rebound and no guarding.  Genitourinary: Rectum normal and vagina normal. Pelvic exam was performed with patient prone. There is no rash, tenderness or lesion on the right labia. There is no rash, tenderness or lesion on the left labia. Uterus is not enlarged and not tender. Cervix exhibits no motion tenderness and no discharge. Right adnexum displays no mass, no tenderness and no fullness. Left adnexum displays no mass, no tenderness and no fullness.       Vaginal side walls, bladder, and rectum non tender  Musculoskeletal: Normal range of motion.  Neurological: She is alert and oriented to person, place, and time.  Skin: Skin is warm and dry.  Psychiatric: She has a normal mood and affect.          Assessment & Plan:  45 yo female with heavy  menses, dysmonrrhea and migraines  1.  TV US 2.  Start continuous OCPs to help with bleeding and headaches 3.  TSH, CBC 4.  Vicodin for dysmenorrhea and headaches until can control with preventative.  Ibuprofen has been causing GI probs. 5.  Baclofen (day), flexeril (night), for headache and muscle tension. 6.  D/c nutrasweet, decrease caffeine. 7.  RTC to see Bonita Quin in Oct and me in 2 months.

## 2010-10-05 ENCOUNTER — Telehealth: Payer: Self-pay | Admitting: *Deleted

## 2010-10-05 LAB — TSH: TSH: 1.627 u[IU]/mL (ref 0.350–4.500)

## 2010-10-05 LAB — CBC
Hemoglobin: 11.7 g/dL — ABNORMAL LOW (ref 12.0–15.0)
MCH: 27 pg (ref 26.0–34.0)
MCHC: 30.8 g/dL (ref 30.0–36.0)
Platelets: 211 10*3/uL (ref 150–400)

## 2010-10-05 NOTE — Telephone Encounter (Signed)
Test results of Hgb and instructions per Dr Penne Lash

## 2010-10-06 ENCOUNTER — Ambulatory Visit
Admission: RE | Admit: 2010-10-06 | Discharge: 2010-10-06 | Disposition: A | Discharge status: Home / Self Care | Payer: PRIVATE HEALTH INSURANCE | Source: Ambulatory Visit | Attending: Obstetrics & Gynecology | Admitting: Obstetrics & Gynecology

## 2010-10-06 DIAGNOSIS — N920 Excessive and frequent menstruation with regular cycle: Secondary | ICD-10-CM

## 2010-10-18 ENCOUNTER — Encounter: Payer: Self-pay | Admitting: Emergency Medicine

## 2010-10-18 ENCOUNTER — Other Ambulatory Visit: Payer: Self-pay | Admitting: Emergency Medicine

## 2010-10-18 ENCOUNTER — Inpatient Hospital Stay (INDEPENDENT_AMBULATORY_CARE_PROVIDER_SITE_OTHER)
Admission: RE | Admit: 2010-10-18 | Discharge: 2010-10-18 | Disposition: A | Discharge status: Home / Self Care | Payer: PRIVATE HEALTH INSURANCE | Source: Ambulatory Visit | Attending: Emergency Medicine | Admitting: Emergency Medicine

## 2010-10-18 ENCOUNTER — Ambulatory Visit
Admission: RE | Admit: 2010-10-18 | Discharge: 2010-10-18 | Disposition: A | Discharge status: Home / Self Care | Payer: PRIVATE HEALTH INSURANCE | Source: Ambulatory Visit | Attending: Emergency Medicine | Admitting: Emergency Medicine

## 2010-10-18 DIAGNOSIS — M25579 Pain in unspecified ankle and joints of unspecified foot: Secondary | ICD-10-CM

## 2010-11-14 ENCOUNTER — Ambulatory Visit: Payer: PRIVATE HEALTH INSURANCE | Admitting: Obstetrics & Gynecology

## 2010-11-16 ENCOUNTER — Other Ambulatory Visit: Payer: Self-pay | Admitting: Obstetrics & Gynecology

## 2010-11-16 MED ORDER — CYCLOBENZAPRINE HCL 10 MG PO TABS: 10.0000 mg | ORAL_TABLET | Freq: Three times a day (TID) | ORAL | Status: DC | PRN

## 2010-12-25 NOTE — Progress Notes (Signed)
Summary: LT ANKLE INJ...WSE rm 5   Vital Signs:  Patient Profile:   45 Years Old Female CC:      LT ankle injury x today Height:     69 inches O2 Sat:      99 % O2 treatment:    Room Air Temp:     98.4 degrees F oral Pulse rate:   85 / minute Resp:     18 per minute BP sitting:   123 / 80  (left arm) Cuff size:   regular  Vitals Entered By: Clemens Catholic LPN (October 18, 2010 6:22 PM)                  Updated Prior Medication List: VYVANSE 30 MG CAPS (LISDEXAMFETAMINE DIMESYLATE) Take 1 tablet by mouth once a day BACLOFEN 10 MG TABS (BACLOFEN)  BCP Current Allergies (reviewed today): SUMATRIPTAN SUCCINATE (SUMATRIPTAN SUCCINATE) ZOMIG (ZOLMITRIPTAN)History of Present Illness Chief Complaint: LT ankle injury x today History of Present Illness: L ankle injury & pain.  Was walking down stairs today, twisted ankle and fell.  Now with pain and swelling on the outside of her L foot.  Hasn't used any meds or modalities yet.    REVIEW OF SYSTEMS Constitutional Symptoms      Denies fever, chills, night sweats, weight loss, weight gain, and fatigue.  Eyes       Denies change in vision, eye pain, eye discharge, glasses, contact lenses, and eye surgery. Ear/Nose/Throat/Mouth       Denies hearing loss/aids, change in hearing, ear pain, ear discharge, dizziness, frequent runny nose, frequent nose bleeds, sinus problems, sore throat, hoarseness, and tooth pain or bleeding.  Respiratory       Denies dry cough, productive cough, wheezing, shortness of breath, asthma, bronchitis, and emphysema/COPD.  Cardiovascular       Denies murmurs, chest pain, and tires easily with exhertion.    Gastrointestinal       Denies stomach pain, nausea/vomiting, diarrhea, constipation, blood in bowel movements, and indigestion. Genitourniary       Denies painful urination, kidney stones, and loss of urinary control. Neurological       Denies paralysis, seizures, and  fainting/blackouts. Musculoskeletal       Complains of muscle pain and joint pain.      Denies joint stiffness, decreased range of motion, redness, swelling, muscle weakness, and gout.  Skin       Denies bruising, unusual mles/lumps or sores, and hair/skin or nail changes.  Psych       Denies mood changes, temper/anger issues, anxiety/stress, speech problems, depression, and sleep problems. Other Comments: pt c/o LT ankle injury x today.    Past History:  Past Medical History: Hx allergy shots ADD  Past Surgical History: Gyn surgery 2002 Tubal ligation  Family History: Reviewed history from 09/08/2009 and no changes required. _Adopted Son with HA  Social History: Reviewed history from 06/25/2008 and no changes required.   Full time student.   Married to [employer] with 2 children.  Quit smoking 5 yrs ago, occ EtOH, 2 caffeinated drinks per day, Yoga for exercise. Physical Exam General appearance: well developed, well nourished, no acute distress but sitting in wheelchair MSE: oriented to time, place, and person L foot/ankle: FROM, full strength.  +TTP lateral malleolus, ATFL, CFL.  No TTPmedial malleolus, navicular, base of 5th, calcaneus, Achilles, or proximal fibula.  +swelling lateral ankle.  No ecchymoses. Distal NV status intact. Assessment New Problems: ANKLE PAIN (ICD-719.47)   Plan  New Medications/Changes: VICODIN 5-500 MG TABS (HYDROCODONE-ACETAMINOPHEN) 1 by mouth q8 hrs as needed for pain  #18 x 0, 10/18/2010, Hoyt Koch MD  New Orders: Est. Patient Level IV [16109] T-DG Ankle Complete*L* [73610] Ambulatory Surgical Boot ea [L3260] Planning Comments:   Xray obtained and read by radiology as "Probable acute avulsion fracture fragment from the lateral malleolus, with adjacent soft tissue swelling.  Curvilinear metallic foreign body projects between the distal tibia and distal fibula diaphyses.  This foreign body is of uncertain chronicity."  Radiology  dept believes this is likely artifact as it looks exactly the same on all projections.  Encourage rest, ice, elevation, immobilization.  Follow-up with your primary care physician or sports med or ortho if not improving or if getting worse.  Placed her in a boot and wrote Rx for Vicodin/   The patient and/or caregiver has been counseled thoroughly with regard to medications prescribed including dosage, schedule, interactions, rationale for use, and possible side effects and they verbalize understanding.  Diagnoses and expected course of recovery discussed and will return if not improved as expected or if the condition worsens. Patient and/or caregiver verbalized understanding.  Prescriptions: VICODIN 5-500 MG TABS (HYDROCODONE-ACETAMINOPHEN) 1 by mouth q8 hrs as needed for pain  #18 x 0   Entered and Authorized by:   Hoyt Koch MD   Signed by:   Hoyt Koch MD on 10/18/2010   Method used:   Print then Give to Patient   RxID:   [id number]   Orders Added: 1)  Est. Patient Level IV [95621] 2)  T-DG Ankle Complete*L* [73610] 3)  Ambulatory Surgical Boot ea [L3260]

## 2010-12-26 ENCOUNTER — Ambulatory Visit (INDEPENDENT_AMBULATORY_CARE_PROVIDER_SITE_OTHER): Payer: PRIVATE HEALTH INSURANCE | Admitting: Family Medicine

## 2010-12-26 ENCOUNTER — Institutional Professional Consult (permissible substitution): Payer: PRIVATE HEALTH INSURANCE | Admitting: Nurse Practitioner

## 2010-12-26 ENCOUNTER — Encounter: Payer: Self-pay | Admitting: Family Medicine

## 2010-12-26 VITALS — BP 131/85 | HR 76 | Temp 98.5°F | Wt 150.0 lb

## 2010-12-26 DIAGNOSIS — G43909 Migraine, unspecified, not intractable, without status migrainosus: Secondary | ICD-10-CM

## 2010-12-26 DIAGNOSIS — J3489 Other specified disorders of nose and nasal sinuses: Secondary | ICD-10-CM

## 2010-12-26 MED ORDER — KETOROLAC TROMETHAMINE 60 MG/2ML IM SOLN
60.0000 mg | Freq: Once | INTRAMUSCULAR | Status: AC
  Administered 2010-12-26: 60 mg via INTRAMUSCULAR

## 2010-12-26 MED ORDER — AZITHROMYCIN 250 MG PO TABS
ORAL_TABLET | ORAL | Status: AC
Start: 2010-12-26 — End: 2010-12-31

## 2010-12-26 MED ORDER — PREDNISONE 10 MG PO TABS: ORAL_TABLET | ORAL | Status: DC

## 2010-12-26 NOTE — Progress Notes (Signed)
  Subjective:    Patient ID: Lacey Jensen, female    DOB: 1965/12/31, 45 y.o.   MRN: [id number]  HPI HA for 5 days. She thinks she has a sinus infection.  No nasal congestion.  Has a lot of pressure in her forehead or teeth.  Nose feels really dry. Cough and productive cough. No fever.  No GI sxs.  Was taking mucinex D but was more drying to stopped it.  Even took an old hydrocodone and tht didn't even help her HA. Says she feels this doesn't feel like a migraine. No nausea or vomiting. Pessure is behind both eyes and throbs at times.    Review of Systems     Objective:   Physical Exam  Constitutional: She is oriented to person, place, and time. She appears well-developed and well-nourished.  HENT:  Head: Normocephalic and atraumatic.  Right Ear: External ear normal.  Left Ear: External ear normal.  Nose: Nose normal.  Mouth/Throat: Oropharynx is clear and moist.       TMs and canals are clear.   Eyes: Conjunctivae and EOM are normal. Pupils are equal, round, and reactive to light.  Neck: Neck supple. No thyromegaly present.  Cardiovascular: Normal rate, regular rhythm and normal heart sounds.   Pulmonary/Chest: Effort normal and breath sounds normal. She has no wheezes.  Lymphadenopathy:    She has no cervical adenopathy.  Neurological: She is alert and oriented to person, place, and time. No cranial nerve deficit.  Skin: Skin is warm and dry.  Psychiatric: She has a normal mood and affect.          Assessment & Plan:  Migraine - I really think her HA are migraines. I really don't think this is sinusitis. Will give toradol 60 mg injection for now and will give steroid burst to try to break the HA. If not better of suddenly gets worse can fill the rx fo zpack. She really may need ot be on something prophylactically. She has appt with HA specialist in a couple of weeks.  Dsicussed using a humidifier as well.

## 2010-12-26 NOTE — Patient Instructions (Signed)
Call if headache is not better by the end of the week Wait and hold the antibiotic unless not better in a couple of days or suddenly worse.

## 2010-12-27 ENCOUNTER — Ambulatory Visit: Payer: PRIVATE HEALTH INSURANCE | Admitting: Obstetrics & Gynecology

## 2011-01-05 ENCOUNTER — Institutional Professional Consult (permissible substitution): Payer: PRIVATE HEALTH INSURANCE | Admitting: Nurse Practitioner

## 2011-01-05 DIAGNOSIS — R51 Headache: Secondary | ICD-10-CM

## 2011-01-10 ENCOUNTER — Ambulatory Visit: Payer: PRIVATE HEALTH INSURANCE | Admitting: Obstetrics & Gynecology

## 2011-01-29 ENCOUNTER — Other Ambulatory Visit: Payer: Self-pay | Admitting: Family Medicine

## 2011-05-01 ENCOUNTER — Ambulatory Visit: Payer: PRIVATE HEALTH INSURANCE | Admitting: Obstetrics & Gynecology

## 2011-05-07 ENCOUNTER — Other Ambulatory Visit: Payer: Self-pay | Admitting: Family Medicine

## 2011-05-08 ENCOUNTER — Encounter: Payer: Self-pay | Admitting: Obstetrics & Gynecology

## 2011-05-08 ENCOUNTER — Ambulatory Visit (INDEPENDENT_AMBULATORY_CARE_PROVIDER_SITE_OTHER): Payer: PRIVATE HEALTH INSURANCE | Admitting: Obstetrics & Gynecology

## 2011-05-08 ENCOUNTER — Ambulatory Visit: Payer: PRIVATE HEALTH INSURANCE | Admitting: Obstetrics & Gynecology

## 2011-05-08 VITALS — BP 133/81 | HR 86 | Temp 98.5°F | Resp 16 | Ht 69.0 in | Wt 152.0 lb

## 2011-05-08 DIAGNOSIS — R51 Headache: Secondary | ICD-10-CM

## 2011-05-08 DIAGNOSIS — N926 Irregular menstruation, unspecified: Secondary | ICD-10-CM

## 2011-05-08 DIAGNOSIS — Z01812 Encounter for preprocedural laboratory examination: Secondary | ICD-10-CM

## 2011-05-08 LAB — POCT URINE PREGNANCY: Preg Test, Ur: NEGATIVE

## 2011-05-08 MED ORDER — VALACYCLOVIR HCL 500 MG PO TABS: 500.0000 mg | ORAL_TABLET | Freq: Two times a day (BID) | ORAL | Status: DC

## 2011-05-08 NOTE — Progress Notes (Signed)
  Subjective:     Lacey Jensen is an 46 y.o. woman who presents for irregular menses. She had been bleeding regularly. She is now bleeding every continuously since early February. At times there is just spotting and other times there is heavy bleeding with clots. Dysmenorrhea:moderate, occurring throughout menses. Cyclic symptoms include: headache. Current contraception: tubal ligation. History of infertility: no. History of abnormal Pap smear: yes - age 6 and age 7.  Has had a Leep.  Menstrual History: OB History    Grav Para Term Preterm Abortions TAB SAB Ect Mult Living   2 2        2       Patient's last menstrual period was 03/29/2011.    The following portions of the patient's history were reviewed and updated as appropriate: allergies, current medications, past family history, past medical history, past social history, past surgical history and problem list.  Review of Systems Cardiovascular: negative Gastrointestinal: negative Genitourinary:positive for bleeding Neurological: positive for headaches    Objective:    BP 133/81  Pulse 86  Temp(Src) 98.5 F (36.9 C) (Oral)  Resp 16  Ht 5\' 9"  (1.753 m)  Wt 152 lb (68.947 kg)  BMI 22.45 kg/m2  LMP 03/29/2011  General:   alert, cooperative and no distress  Skin:    normal and ? herpetic lesion on upper lip  Neck:   Abdomen:  soft, non-tender; bowel sounds normal; no masses,  no organomegaly  Pelvic:   external genitalia normal, no adnexal masses or tenderness, no cervical motion tenderness, vagina normal without discharge and uterus retroverted with some tenderness, scant brownish blood in vault, cervix with appearance of prior Leep     Assessment:    menometrorrhagia    Plan:    Biopsy done today, see separate note..  Pt has been on continuous OCPs.  Will now try cyclical OCPs so hopefully she can have a planned menses. [place] not repeated as was nml except for 2 small fibroids without submucosal  component.   ENDOMETRIAL BIOPSY     The indications for endometrial biopsy were reviewed.   Risks of the biopsy including cramping, bleeding, infection, uterine perforation, inadequate specimen and need for additional procedures  were discussed. The patient states she understands and agrees to undergo procedure today. Consent was signed. Time out was performed. Urine HCG was negative. A sterile speculum was placed in the patient's vagina and the cervix was prepped with Betadine. A single-toothed tenaculum was placed on the anterior lip of the cervix to stabilize it. The 3 mm pipelle was introduced into the endometrial cavity without difficulty to a depth of 7.5 cm, and a moderate amount of tissue was obtained and sent to pathology.  Two passes with the pipelle were made.  The instruments were removed from the patient's vagina. Minimal bleeding from the cervix was noted. The patient tolerated the procedure well. Routine post-procedure instructions were given to the patient. The patient will follow up to review the results and for further management.

## 2011-05-09 LAB — CBC
HCT: 38.6 % (ref 36.0–46.0)
MCHC: 32.1 g/dL (ref 30.0–36.0)
Platelets: 241 10*3/uL (ref 150–400)
RDW: 13 % (ref 11.5–15.5)
WBC: 5.8 10*3/uL (ref 4.0–10.5)

## 2011-05-30 ENCOUNTER — Ambulatory Visit: Payer: PRIVATE HEALTH INSURANCE | Admitting: Obstetrics & Gynecology

## 2011-06-06 ENCOUNTER — Encounter: Payer: Self-pay | Admitting: Obstetrics & Gynecology

## 2011-06-06 ENCOUNTER — Ambulatory Visit (INDEPENDENT_AMBULATORY_CARE_PROVIDER_SITE_OTHER): Payer: PRIVATE HEALTH INSURANCE | Admitting: Obstetrics & Gynecology

## 2011-06-06 VITALS — BP 116/74 | HR 72 | Temp 98.3°F | Resp 16 | Ht 69.0 in | Wt 151.0 lb

## 2011-06-06 DIAGNOSIS — N6009 Solitary cyst of unspecified breast: Secondary | ICD-10-CM

## 2011-06-06 NOTE — Progress Notes (Signed)
  Subjective:    Patient ID: Lacey Jensen, female    DOB: 07-18-1965, 46 y.o.   MRN: [id number]  HPI  Pt's bleeding much better NOT skipping placebo pills.  Pt is satisfied with menses.  Pt is due for pap smear June 2013 (no history of abnml paps).  Pt overdue for mammogram and [place] from prior birads 3 report.  Pt agrees to get this done ASAP  Review of Systems No complaints    Objective:   Physical Exam BP nml No exam indicated     Assessment & Plan:  46 year old female with menorrhagia resolved with stopping continuous OCPs and now taking placebo pills  1-Needs mammogram and [place] ASAP 2-Needs pap smear this summer.

## 2011-06-12 ENCOUNTER — Other Ambulatory Visit: Payer: PRIVATE HEALTH INSURANCE

## 2011-06-22 ENCOUNTER — Other Ambulatory Visit: Payer: PRIVATE HEALTH INSURANCE

## 2011-07-03 ENCOUNTER — Other Ambulatory Visit: Payer: Self-pay | Admitting: *Deleted

## 2011-07-03 NOTE — Telephone Encounter (Signed)
LMOM with detailed message.

## 2011-07-03 NOTE — Telephone Encounter (Signed)
Make appt since has been almost one year, so we cna recheck BP, etc.

## 2011-07-03 NOTE — Telephone Encounter (Signed)
Pt states she stopped her Vyvanse while she got her HA's under control with another doctor. States she has some left and started taking them again and would like to know if you will refill the Vyvanse or does she need to come see you first. Last Vyvanse refill was 08/2010.

## 2011-07-11 ENCOUNTER — Other Ambulatory Visit: Payer: Self-pay | Admitting: *Deleted

## 2011-07-11 DIAGNOSIS — N926 Irregular menstruation, unspecified: Secondary | ICD-10-CM

## 2011-07-11 MED ORDER — NORGESTIMATE-ETH ESTRADIOL 0.25-35 MG-MCG PO TABS: 1.0000 | ORAL_TABLET | Freq: Every day | ORAL | Status: DC

## 2011-07-13 ENCOUNTER — Ambulatory Visit: Payer: PRIVATE HEALTH INSURANCE | Admitting: Family Medicine

## 2011-07-23 ENCOUNTER — Other Ambulatory Visit: Payer: PRIVATE HEALTH INSURANCE

## 2011-07-24 ENCOUNTER — Encounter: Payer: Self-pay | Admitting: *Deleted

## 2011-07-24 ENCOUNTER — Emergency Department (INDEPENDENT_AMBULATORY_CARE_PROVIDER_SITE_OTHER)
Admission: EM | Admit: 2011-07-24 | Discharge: 2011-07-24 | Disposition: A | Discharge status: Home / Self Care | Payer: PRIVATE HEALTH INSURANCE | Source: Home / Self Care | Attending: Emergency Medicine | Admitting: Emergency Medicine

## 2011-07-24 DIAGNOSIS — J019 Acute sinusitis, unspecified: Secondary | ICD-10-CM

## 2011-07-24 MED ORDER — FLUTICASONE PROPIONATE 50 MCG/ACT NA SUSP: 1.0000 | Freq: Two times a day (BID) | NASAL | Status: DC

## 2011-07-24 MED ORDER — HYDROCODONE-ACETAMINOPHEN 5-500 MG PO TABS: ORAL_TABLET | ORAL | Status: AC

## 2011-07-24 MED ORDER — AZITHROMYCIN 250 MG PO TABS: ORAL_TABLET | ORAL | Status: AC

## 2011-07-24 NOTE — ED Notes (Signed)
Pt c/o nasal congestion, sinus pain, teeth hurt, and HA x 5 days. Denies fever. She has taken Mucinex and sudafed.

## 2011-07-24 NOTE — ED Provider Notes (Signed)
History    URI HISTORY  Lacey Jensen is a 46 y.o. female who complains of onset of cold symptoms for 5 days. Symptoms are worsening.  Have been using over-the-counter treatment which helps a little bit. She states that Z-Pak helped similar symptoms 3 or 4 months ago. She also requests a small amount of pain medication for her headache, as she states Vicodin was helpful in the past, and she understands that this is a controlled drug and she states she does not take this chronically and last time she took Vicodin was at least 3 or 4 months ago for acute pain.  No chills/sweats +  Fever, minimal  +  Nasal congestion +  Discolored Post-nasal drainage Positive sinus pain/pressure, bilateral maxillary and frontal area. No sore throat  +  Cough, occasionally productive of green sputum. No wheezing No chest congestion No hemoptysis No shortness of breath No pleuritic pain  No itchy/red eyes No earache  No nausea No vomiting No abdominal pain No diarrhea  No skin rashes +  Fatigue No myalgias No headache  CSN: [id number]  Arrival date & time 07/24/11  1051   First MD Initiated Contact with Patient 07/24/11 1107      Chief Complaint  Patient presents with  . Nasal Congestion     HPI  Past Medical History  Diagnosis Date  . Immunotherapy   . Headache   . Dysplasia of cervix     leep  . Abnormal Pap smear     Age 24 and age 61    Past Surgical History  Procedure Date  . A gyn surgery 2002  . Btl     Family History  Problem Relation Age of Onset  . Migraines Son     History  Substance Use Topics  . Smoking status: Former Smoker    Quit date: 01/23/2004  . Smokeless tobacco: Never Used  . Alcohol Use: 0.5 oz/week    1 drink(s) per week    OB History    Grav Para Term Preterm Abortions TAB SAB Ect Mult Living   2 2        2       Review of Systems  Allergies  Sumatriptan succinate and Zolmitriptan  Home Medications   Current Outpatient Rx  Name  Route Sig Dispense Refill  . AZITHROMYCIN 250 MG PO TABS  Use as directed 1 each 0  . CYCLOBENZAPRINE HCL 10 MG PO TABS Oral Take 1 tablet (10 mg total) by mouth every 8 (eight) hours as needed for muscle spasms. 30 tablet 1  . FLUOXETINE HCL 20 MG PO TABS  TAKE 1 TABLET BY MOUTH EVERY DAY 30 tablet 1  . FLUTICASONE PROPIONATE 50 MCG/ACT NA SUSP Nasal Place 1 spray into the nose 2 (two) times daily. 16 g 0  . HYDROCODONE-ACETAMINOPHEN 5-500 MG PO TABS  Take 1 or 2 every 4-6 hours as needed for severe pain 10 tablet 0  . LISDEXAMFETAMINE DIMESYLATE 30 MG PO CAPS Oral Take 1 capsule (30 mg total) by mouth every morning. 30 capsule 0  . LISDEXAMFETAMINE DIMESYLATE 30 MG PO CAPS Oral Take 1 capsule (30 mg total) by mouth every morning. 30 capsule 0  . NORGESTIMATE-ETH ESTRADIOL 0.25-35 MG-MCG PO TABS Oral Take 1 tablet by mouth daily. 1 Package 6    BP 111/75  Pulse 88  Temp 98.4 F (36.9 C) (Oral)  Resp 18  Ht 5\' 9"  (1.753 m)  Wt 150 lb (68.04 kg)  BMI 22.15 kg/m2  SpO2 98%  LMP 07/10/2011  Physical Exam  Nursing note and vitals reviewed. Constitutional: She is oriented to person, place, and time. She appears well-developed and well-nourished. No distress.  HENT:  Head: Normocephalic and atraumatic.  Right Ear: Tympanic membrane, external ear and ear canal normal.  Left Ear: Tympanic membrane, external ear and ear canal normal.  Nose: Mucosal edema and rhinorrhea present. Right sinus exhibits maxillary sinus tenderness. Left sinus exhibits maxillary sinus tenderness.  Mouth/Throat: Oropharynx is clear and moist. No oral lesions. No oropharyngeal exudate.  Eyes: Right eye exhibits no discharge. Left eye exhibits no discharge. No scleral icterus.  Neck: Neck supple.  Cardiovascular: Normal rate, regular rhythm and normal heart sounds.   Pulmonary/Chest: Effort normal and breath sounds normal. She has no wheezes. She has no rales.  Lymphadenopathy:    She has no cervical adenopathy.    Neurological: She is alert and oriented to person, place, and time.  Skin: Skin is warm and dry.    ED Course  Procedures (including critical care time)  Labs Reviewed - No data to display No results found.   1. Sinusitis, acute       MDM  Prescribed Z-Pak for the sinusitis as it worked well in the past and she might have early bronchitis Z-Pak will help with that also. Fluticasone prescribed, as that has helped in the past. I prescribed #10 Vicodin, with precautions discussed. She understands this is not for chronic use.--See discussion in history of present illness. See detailed Instructions in AVS, which were given to patient. Verbal instructions also given. Risks, benefits, and alternatives of treatment options discussed. Questions invited and answered. Patient voiced understanding and agreement with plans.        Lajean Manes, MD 07/24/11 1136

## 2011-08-24 ENCOUNTER — Encounter: Payer: Self-pay | Admitting: Family Medicine

## 2011-08-24 ENCOUNTER — Ambulatory Visit (INDEPENDENT_AMBULATORY_CARE_PROVIDER_SITE_OTHER): Payer: PRIVATE HEALTH INSURANCE | Admitting: Family Medicine

## 2011-08-24 VITALS — BP 125/75 | HR 80 | Wt 153.0 lb

## 2011-08-24 DIAGNOSIS — F909 Attention-deficit hyperactivity disorder, unspecified type: Secondary | ICD-10-CM

## 2011-08-24 DIAGNOSIS — G43909 Migraine, unspecified, not intractable, without status migrainosus: Secondary | ICD-10-CM

## 2011-08-24 MED ORDER — METHYLPHENIDATE HCL ER (OSM) 27 MG PO TBCR: 27.0000 mg | EXTENDED_RELEASE_TABLET | ORAL | Status: DC

## 2011-08-24 NOTE — Progress Notes (Signed)
  Subjective:    Patient ID: Lacey Jensen, female    DOB: 06-20-1965, 46 y.o.   MRN: [id number]  HPI Stopped the Vyvanse bc of HA.  Tried it again in Jan adn HA came back.  Usually gets them twice a month but on the vyvanse was having 3 a months.  No BP problems on the medicine. 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    Review of Systems     Objective:   Physical Exam  Constitutional: She is oriented to person, place, and time. She appears well-developed and well-nourished.  HENT:  Head: Normocephalic and atraumatic.  Cardiovascular: Normal rate, regular rhythm and normal heart sounds.   Pulmonary/Chest: Effort normal and breath sounds normal.  Neurological: She is alert and oriented to person, place, and time.  Skin: Skin is warm and dry.  Psychiatric: She has a normal mood and affect. Her behavior is normal.          Assessment & Plan:  ADHD -will try concerta. Stop immediately if any chest pain or shortness of breath. We will keep an eye on her blood pressure. Followup in one month to check weight make sure it's not interfering with sleep. I did encourage her to keep a headache diary on the medication.  Migraines - Better controlled of the vyvanse.  Will change to Concerta.

## 2011-08-28 ENCOUNTER — Telehealth: Payer: Self-pay | Admitting: *Deleted

## 2011-08-28 MED ORDER — AMPHETAMINE-DEXTROAMPHET ER 15 MG PO CP24: 15.0000 mg | ORAL_CAPSULE | ORAL | Status: DC

## 2011-08-28 NOTE — Telephone Encounter (Signed)
Pt called stating that her insurance will not cover the Concerta. She states it was $180. She states you mentioned trying Adderall. She would like to have that sent to the pharmacy to see if insurance will cover it.

## 2011-08-28 NOTE — Telephone Encounter (Signed)
LMOM

## 2011-08-28 NOTE — Telephone Encounter (Signed)
Ok to come and pick up new rx.

## 2011-09-04 ENCOUNTER — Other Ambulatory Visit: Payer: Self-pay | Admitting: Family Medicine

## 2011-10-18 ENCOUNTER — Other Ambulatory Visit: Payer: Self-pay | Admitting: *Deleted

## 2011-10-18 MED ORDER — AMPHETAMINE-DEXTROAMPHET ER 15 MG PO CP24: 15.0000 mg | ORAL_CAPSULE | ORAL | Status: DC

## 2011-11-24 ENCOUNTER — Other Ambulatory Visit: Payer: Self-pay | Admitting: Family Medicine

## 2011-11-26 MED ORDER — FLUOXETINE HCL 20 MG PO TABS: 20.0000 mg | ORAL_TABLET | Freq: Every day | ORAL | Status: DC

## 2011-11-26 NOTE — Addendum Note (Signed)
Addended by: Barry Dienes A on: 11/26/2011 08:13 AM   Modules accepted: Orders

## 2011-12-27 ENCOUNTER — Other Ambulatory Visit: Payer: Self-pay

## 2011-12-27 DIAGNOSIS — F909 Attention-deficit hyperactivity disorder, unspecified type: Secondary | ICD-10-CM

## 2011-12-27 MED ORDER — AMPHETAMINE-DEXTROAMPHET ER 15 MG PO CP24: 15.0000 mg | ORAL_CAPSULE | ORAL | Status: DC

## 2011-12-27 NOTE — Telephone Encounter (Signed)
Patient called and left a message on nurse line asking for a return call.   Adderall prescription sent to printer.

## 2012-01-15 ENCOUNTER — Other Ambulatory Visit: Payer: PRIVATE HEALTH INSURANCE

## 2012-03-13 ENCOUNTER — Other Ambulatory Visit: Payer: Self-pay | Admitting: *Deleted

## 2012-03-13 DIAGNOSIS — F909 Attention-deficit hyperactivity disorder, unspecified type: Secondary | ICD-10-CM

## 2012-03-13 MED ORDER — AMPHETAMINE-DEXTROAMPHET ER 15 MG PO CP24: 15.0000 mg | ORAL_CAPSULE | ORAL | Status: DC

## 2012-03-22 ENCOUNTER — Encounter: Payer: Self-pay | Admitting: Emergency Medicine

## 2012-03-22 ENCOUNTER — Emergency Department (INDEPENDENT_AMBULATORY_CARE_PROVIDER_SITE_OTHER)
Admission: EM | Admit: 2012-03-22 | Discharge: 2012-03-22 | Disposition: A | Discharge status: Home / Self Care | Payer: BC Managed Care – PPO | Source: Home / Self Care | Attending: Family Medicine | Admitting: Family Medicine

## 2012-03-22 DIAGNOSIS — J0120 Acute ethmoidal sinusitis, unspecified: Secondary | ICD-10-CM

## 2012-03-22 DIAGNOSIS — J0110 Acute frontal sinusitis, unspecified: Secondary | ICD-10-CM

## 2012-03-22 MED ORDER — PREDNISONE 20 MG PO TABS: 20.0000 mg | ORAL_TABLET | Freq: Two times a day (BID) | ORAL | Status: DC

## 2012-03-22 MED ORDER — AMOXICILLIN 875 MG PO TABS: 875.0000 mg | ORAL_TABLET | Freq: Two times a day (BID) | ORAL | Status: DC

## 2012-03-22 NOTE — ED Notes (Signed)
Reports almost 2 weeks of congestion with associated sinus/facial pain; no recent OTCs. Has history of sinus infections and believes that is what she now has.

## 2012-03-22 NOTE — ED Provider Notes (Signed)
History     CSN: [id number]  Arrival date & time 03/22/12  1210   First MD Initiated Contact with Patient 03/22/12 1252      Chief Complaint  Patient presents with  . Facial Pain       HPI Comments: Patient has a history of perennial rhinitis, and over the past two weeks has had more sinus congestion than usual with development of facial pressure and pain.  No URI symptoms.  No fevers, chills, and sweats   The history is provided by the patient.    Past Medical History  Diagnosis Date  . Immunotherapy   . Headache   . Dysplasia of cervix     leep  . Abnormal Pap smear     Age 56 and age 64    Past Surgical History  Procedure Laterality Date  . A gyn surgery  2002  . Btl      Family History  Problem Relation Age of Onset  . Migraines Son     History  Substance Use Topics  . Smoking status: Former Smoker    Quit date: 01/23/2004  . Smokeless tobacco: Never Used  . Alcohol Use: 0.5 oz/week    1 drink(s) per week    OB History   Grav Para Term Preterm Abortions TAB SAB Ect Mult Living   2 2        2       Review of Systems No sore throat No cough No pleuritic pain No wheezing + nasal congestion + post-nasal drainage + sinus pain/pressure No itchy/red eyes No earache No hemoptysis No SOB No fever/chills No nausea No vomiting No abdominal pain No diarrhea No urinary symptoms No skin rashes + fatigue No myalgias + headache    Allergies  Sumatriptan succinate and Zolmitriptan  Home Medications   Current Outpatient Rx  Name  Route  Sig  Dispense  Refill  . amoxicillin (AMOXIL) 875 MG tablet   Oral   Take 1 tablet (875 mg total) by mouth 2 (two) times daily.   28 tablet   0   . amphetamine-dextroamphetamine (ADDERALL XR) 15 MG 24 hr capsule   Oral   Take 1 capsule (15 mg total) by mouth every morning.   30 capsule   0   . FLUoxetine (PROZAC) 20 MG tablet   Oral   Take 1 tablet (20 mg total) by mouth daily.   30 tablet   3   .  EXPIRED: lisdexamfetamine (VYVANSE) 30 MG capsule   Oral   Take 1 capsule (30 mg total) by mouth every morning.   30 capsule   0   . norgestimate-ethinyl estradiol (ORTHO-CYCLEN, 28,) 0.25-35 MG-MCG tablet   Oral   Take 1 tablet by mouth daily.   1 Package   6   . predniSONE (DELTASONE) 20 MG tablet   Oral   Take 1 tablet (20 mg total) by mouth 2 (two) times daily. Take with food.   10 tablet   0     BP 126/78  Pulse 84  Temp(Src) 98.4 F (36.9 C) (Oral)  Ht 5\' 9"  (1.753 m)  Wt 140 lb (63.504 kg)  BMI 20.67 kg/m2  SpO2 99%  LMP 03/22/2012  Physical Exam Nursing notes and Vital Signs reviewed. Appearance:  Patient appears healthy, stated age, and in no acute distress Eyes:  Pupils are equal, round, and reactive to light and accomodation.  Extraocular movement is intact.  Conjunctivae are not inflamed  Ears:  Canals  normal.  Tympanic membranes normal.  Nose:  Moderately congested turbinates.  Frontal and ethmoidal sinus tenderness is present.  Pharynx:  Normal Neck:  Supple.  No adenopathy Lungs:  Clear to auscultation.  Breath sounds are equal.  Heart:  Regular rate and rhythm without murmurs, rubs, or gallops.  Abdomen:  Nontender without masses or hepatosplenomegaly.  Bowel sounds are present.  No CVA or flank tenderness.  Extremities:  No edema.  No calf tenderness Skin:  No rash present.   ED Course  Procedures  none      1. Acute frontal sinusitis   2. Acute ethmoidal sinusitis       MDM  Begin amoxicillin for two weeks, and prednisone burst. Take Mucinex D (guaifenesin with decongestant) twice daily for congestion.  Increase fluid intake, rest. May use Afrin nasal spray (or generic oxymetazoline) twice daily for about 5 days.  Also recommend using saline nasal spray several times daily and saline nasal irrigation (AYR is a common brand) Followup with ENT if not improved in two weeks.        Lattie Haw, MD 03/24/12 [phone]

## 2012-05-06 ENCOUNTER — Other Ambulatory Visit: Payer: Self-pay | Admitting: Family Medicine

## 2012-05-28 ENCOUNTER — Other Ambulatory Visit: Payer: Self-pay | Admitting: *Deleted

## 2012-05-28 DIAGNOSIS — F909 Attention-deficit hyperactivity disorder, unspecified type: Secondary | ICD-10-CM

## 2012-05-28 MED ORDER — AMPHETAMINE-DEXTROAMPHET ER 15 MG PO CP24: 15.0000 mg | ORAL_CAPSULE | ORAL | Status: DC

## 2012-06-18 ENCOUNTER — Ambulatory Visit (INDEPENDENT_AMBULATORY_CARE_PROVIDER_SITE_OTHER): Payer: BC Managed Care – PPO | Admitting: Advanced Practice Midwife

## 2012-06-18 ENCOUNTER — Encounter: Payer: Self-pay | Admitting: Advanced Practice Midwife

## 2012-06-18 VITALS — BP 132/79 | HR 86 | Resp 16 | Ht 69.0 in | Wt 158.0 lb

## 2012-06-18 DIAGNOSIS — Z87898 Personal history of other specified conditions: Secondary | ICD-10-CM | POA: Insufficient documentation

## 2012-06-18 DIAGNOSIS — Z1151 Encounter for screening for human papillomavirus (HPV): Secondary | ICD-10-CM

## 2012-06-18 DIAGNOSIS — Z01419 Encounter for gynecological examination (general) (routine) without abnormal findings: Secondary | ICD-10-CM

## 2012-06-18 DIAGNOSIS — Z124 Encounter for screening for malignant neoplasm of cervix: Secondary | ICD-10-CM

## 2012-06-18 DIAGNOSIS — N926 Irregular menstruation, unspecified: Secondary | ICD-10-CM

## 2012-06-18 DIAGNOSIS — Z8742 Personal history of other diseases of the female genital tract: Secondary | ICD-10-CM

## 2012-06-18 MED ORDER — NORGESTIMATE-ETH ESTRADIOL 0.25-35 MG-MCG PO TABS: 1.0000 | ORAL_TABLET | Freq: Every day | ORAL | Status: DC

## 2012-06-18 NOTE — Progress Notes (Signed)
  Subjective:     Lacey Jensen is a 47 y.o. female here for a routine exam.  Current complaints: Some periods are still heavy, still has headaches, followed by Dr Joneen Caraway..  Personal health questionnaire reviewed: not asked.   Gynecologic History Patient's last menstrual period was 06/12/2012. Contraception: OCP (estrogen/progesterone) and tubal ligation Last Pap: 2010. Results were: normal Last mammogram: 2012. Results were: normal  Obstetric History OB History   Grav Para Term Preterm Abortions TAB SAB Ect Mult Living   2 2        2      # Outc Date GA Lbr Len/2nd Wgt Sex Del Anes PTL Lv   1 PAR      SVD      2 PAR      SVD          The following portions of the patient's history were reviewed and updated as appropriate: allergies, current medications, past family history, past medical history, past social history, past surgical history and problem list.  Review of Systems Pertinent items are noted in HPI.    Objective:    BP 132/79  Pulse 86  Resp 16  Ht 5\' 9"  (1.753 m)  Wt 71.668 kg (158 lb)  BMI 23.32 kg/m2  LMP 06/12/2012 General appearance: alert, cooperative and no distress Neck: no adenopathy, no carotid bruit, no JVD, supple, symmetrical, trachea midline and thyroid not enlarged, symmetric, no tenderness/mass/nodules Lungs: clear to auscultation bilaterally Breasts: normal appearance, no masses or tenderness Heart: regular rate and rhythm, S1, S2 normal, no murmur, click, rub or gallop Abdomen: soft, non-tender; bowel sounds normal; no masses,  no organomegaly Pelvic: cervix normal in appearance, external genitalia normal, no adnexal masses or tenderness, no cervical motion tenderness, rectovaginal septum normal, uterus normal size, shape, and consistency and vagina normal without discharge Extremities: extremities normal, atraumatic, no cyanosis or edema    Assessment:    Healthy female exam.    Plan:    Education reviewed: self breast exams and OCP  management and risk of VTE. Contraception: OCP (estrogen/progesterone) and tubal ligation. Mammogram ordered. Follow up in: 1 year.

## 2012-06-18 NOTE — Patient Instructions (Signed)

## 2012-06-20 ENCOUNTER — Encounter: Payer: Self-pay | Admitting: Family Medicine

## 2012-06-20 ENCOUNTER — Ambulatory Visit (INDEPENDENT_AMBULATORY_CARE_PROVIDER_SITE_OTHER): Payer: BC Managed Care – PPO | Admitting: Family Medicine

## 2012-06-20 VITALS — BP 124/75 | HR 85 | Wt 161.0 lb

## 2012-06-20 DIAGNOSIS — Z13220 Encounter for screening for lipoid disorders: Secondary | ICD-10-CM

## 2012-06-20 DIAGNOSIS — R635 Abnormal weight gain: Secondary | ICD-10-CM

## 2012-06-20 DIAGNOSIS — G4700 Insomnia, unspecified: Secondary | ICD-10-CM

## 2012-06-20 DIAGNOSIS — F909 Attention-deficit hyperactivity disorder, unspecified type: Secondary | ICD-10-CM

## 2012-06-20 DIAGNOSIS — J019 Acute sinusitis, unspecified: Secondary | ICD-10-CM

## 2012-06-20 MED ORDER — AMPHETAMINE-DEXTROAMPHETAMINE 15 MG PO TABS: 15.0000 mg | ORAL_TABLET | Freq: Every day | ORAL | Status: DC

## 2012-06-20 MED ORDER — AMOXICILLIN-POT CLAVULANATE 875-125 MG PO TABS: 1.0000 | ORAL_TABLET | Freq: Two times a day (BID) | ORAL | Status: DC

## 2012-06-20 MED ORDER — FLUTICASONE PROPIONATE 50 MCG/ACT NA SUSP: 2.0000 | Freq: Every day | NASAL | Status: DC

## 2012-06-20 NOTE — Progress Notes (Signed)
  Subjective:    Patient ID: Lacey Jensen, female    DOB: 1965/12/03, 47 y.o.   MRN: [id number]  HPI ADHD-she would like to switch him to release Adderall to regular Adderall. She thinks it's been keeping her awake at night.  Has tried melatonin but that gives bad dreams. No skipping meals.  She has gained some weight.  Has gained 12 lbs in teh last year with the birth control. No chest pain, short of breath, or palpitations.  Sinus symptoms x2 weeks at this point. Green nasal discharge.No fever. Today extra tired.  + HA.   Frontal pressure and HA and across nasal bridge. Just taking zyrtec.  Was tx for sinusitis. Mild ST. No ear pain.  + postnasal drip.    Review of Systems     Objective:   Physical Exam  Constitutional: She is oriented to person, place, and time. She appears well-developed and well-nourished.  HENT:  Head: Normocephalic and atraumatic.  Right Ear: External ear normal.  Left Ear: External ear normal.  Nose: Nose normal.  Mouth/Throat: Oropharynx is clear and moist.  TMs and canals are clear.   Eyes: Conjunctivae and EOM are normal. Pupils are equal, round, and reactive to light.  Neck: Neck supple. No thyromegaly present.  Cardiovascular: Normal rate, regular rhythm and normal heart sounds.   Pulmonary/Chest: Effort normal and breath sounds normal. She has no wheezes.  Lymphadenopathy:    She has no cervical adenopathy.  Neurological: She is alert and oriented to person, place, and time.  Skin: Skin is warm and dry.  Psychiatric: She has a normal mood and affect.          Assessment & Plan:  ADHD - we will change the short acting Adderall. If she's not noticing any improvement in her sleep at about 1-2 weeks that may not be the Adderall that was causing her sleep disorder. If she's not improving then please followup Jensen we can discuss insomnia.  Acute sinusitis - will treat with Augmentin. If not significantly better in one week then please give the office a  call. Because she has had recurrent sinus infections in the last year at like to start her on nasal steroid spray, fluticasone, in addition to her daily Zyrtec.  Insomnia - will try changing his Adderall XR to regular Adderall to see if her sleep improves. If it does not then followup to discuss insomnia further.

## 2012-06-20 NOTE — Patient Instructions (Signed)

## 2012-06-25 ENCOUNTER — Telehealth: Payer: Self-pay | Admitting: *Deleted

## 2012-06-25 ENCOUNTER — Encounter: Payer: Self-pay | Admitting: *Deleted

## 2012-06-25 NOTE — Telephone Encounter (Signed)
Message copied by Granville Lewis on Wed Jun 25, 2012  8:12 AM ------      Message from: Aviva Signs      Created: Tue Jun 24, 2012  7:57 PM      Regarding: pap letter       Didn't know tanya's email            Normal pap letter      Negative HPV            Thanks!              See you Friday ------

## 2012-06-25 NOTE — Telephone Encounter (Signed)
Pt notified by mail that her pap and HPV were negative

## 2012-06-27 ENCOUNTER — Telehealth: Payer: Self-pay | Admitting: *Deleted

## 2012-06-27 MED ORDER — AZITHROMYCIN 250 MG PO TABS: ORAL_TABLET | ORAL | Status: DC

## 2012-06-27 NOTE — Telephone Encounter (Signed)
I will send over a prescription for azithromycin. She can take this with the Augmentin. Make sure going to finish the Augmentin then she should only have about 3 or 4 more days left she can start the azithromycin today as well. If she's not significantly better by Tuesday or Wednesday of next week then she will need to come back in for reexamination. I agree, she should be responding to the antibiotic by now so we will adjust the regimen.

## 2012-06-27 NOTE — Telephone Encounter (Signed)
LMOM for pt to return call. Kimberly Gordon, LPN  

## 2012-06-27 NOTE — Telephone Encounter (Signed)
Pt given Amoxicillan and NS for sinus infection.  Been using a lot of Ibuprofen for the headache and still has it, not feeling much better adn ibuprofen is messing her stomach up. Wants to know what else she can take for the headache ( she did not work today) and wonders if needs different antibiotic. Please advise

## 2012-06-27 NOTE — Telephone Encounter (Signed)
Pt notified of MD instructions. Kimberly Gordon, LPN  

## 2012-08-07 ENCOUNTER — Other Ambulatory Visit: Payer: Self-pay | Admitting: *Deleted

## 2012-08-07 DIAGNOSIS — N946 Dysmenorrhea, unspecified: Secondary | ICD-10-CM

## 2012-08-07 MED ORDER — IBUPROFEN 600 MG PO TABS: 600.0000 mg | ORAL_TABLET | Freq: Four times a day (QID) | ORAL | Status: DC | PRN

## 2012-08-07 NOTE — Telephone Encounter (Signed)
Pt called requesting a RF on Ibuprofen that she had been given in the past for menstrual cramps.  RX sent to [facility] in [place].

## 2012-09-02 ENCOUNTER — Telehealth: Payer: Self-pay | Admitting: *Deleted

## 2012-09-02 MED ORDER — AMPHETAMINE-DEXTROAMPHETAMINE 15 MG PO TABS: 15.0000 mg | ORAL_TABLET | Freq: Every day | ORAL | Status: DC

## 2012-09-02 NOTE — Telephone Encounter (Signed)
adderall refilled

## 2012-09-02 NOTE — Telephone Encounter (Signed)
Ok to refil 

## 2012-09-02 NOTE — Telephone Encounter (Signed)
Pt calls today asking for a refill on her adderall.  You ok with refilling? Please advise

## 2012-09-15 ENCOUNTER — Telehealth: Payer: Self-pay | Admitting: Family Medicine

## 2012-09-15 NOTE — Telephone Encounter (Signed)
Called pt lvm informing her that she will need to make an appointment.Loralee Pacas [place]

## 2012-09-15 NOTE — Telephone Encounter (Signed)
Patient states sinus infection has returned and can we call in something for her.

## 2012-09-17 LAB — LIPID PANEL
Cholesterol: 183 mg/dL (ref 0–200)
LDL Cholesterol: 101 mg/dL — ABNORMAL HIGH (ref 0–99)
Total CHOL/HDL Ratio: 2.9 Ratio
VLDL: 18 mg/dL (ref 0–40)

## 2012-09-17 LAB — COMPLETE METABOLIC PANEL WITH GFR
ALT: 11 U/L (ref 0–35)
AST: 16 U/L (ref 0–37)
Albumin: 4.1 g/dL (ref 3.5–5.2)
Alkaline Phosphatase: 59 U/L (ref 39–117)
BUN: 12 mg/dL (ref 6–23)
Potassium: 4.1 mEq/L (ref 3.5–5.3)
Sodium: 138 mEq/L (ref 135–145)

## 2012-09-17 NOTE — Progress Notes (Signed)
Quick Note:  All labs are normal. ______ 

## 2012-09-19 ENCOUNTER — Other Ambulatory Visit: Payer: Self-pay | Admitting: *Deleted

## 2012-09-19 DIAGNOSIS — N946 Dysmenorrhea, unspecified: Secondary | ICD-10-CM

## 2012-09-19 MED ORDER — IBUPROFEN 600 MG PO TABS: 600.0000 mg | ORAL_TABLET | Freq: Four times a day (QID) | ORAL | Status: DC | PRN

## 2012-10-03 ENCOUNTER — Encounter: Payer: Self-pay | Admitting: Family Medicine

## 2012-10-03 ENCOUNTER — Ambulatory Visit (INDEPENDENT_AMBULATORY_CARE_PROVIDER_SITE_OTHER): Payer: BC Managed Care – PPO | Admitting: Family Medicine

## 2012-10-03 VITALS — BP 115/66 | HR 75 | Wt 159.0 lb

## 2012-10-03 DIAGNOSIS — J019 Acute sinusitis, unspecified: Secondary | ICD-10-CM

## 2012-10-03 MED ORDER — AMOXICILLIN-POT CLAVULANATE 875-125 MG PO TABS: 1.0000 | ORAL_TABLET | Freq: Two times a day (BID) | ORAL | Status: DC

## 2012-10-03 NOTE — Progress Notes (Signed)
  Subjective:    Patient ID: Lacey Jensen, female    DOB: 1965/09/19, 47 y.o.   MRN: [id number]  HPI Sinus congestion x 1 week wiih pressure behind the eyes.  No runny nose. Says passages feel dry. No ST or fever.  Taking IBU for HA.  Had some sotmach pain while taking the IBU. No cough.     Review of Systems     Objective:   Physical Exam  Constitutional: She is oriented to person, place, and time. She appears well-developed and well-nourished.  HENT:  Head: Normocephalic and atraumatic.  Right Ear: External ear normal.  Left Ear: External ear normal.  Nose: Nose normal.  Mouth/Throat: Oropharynx is clear and moist.  TMs and canals are clear.   Eyes: Conjunctivae and EOM are normal. Pupils are equal, round, and reactive to light.  Neck: Neck supple. No thyromegaly present.  Cardiovascular: Normal rate, regular rhythm and normal heart sounds.   Pulmonary/Chest: Effort normal and breath sounds normal. She has no wheezes.  Lymphadenopathy:    She has no cervical adenopathy.  Neurological: She is alert and oriented to person, place, and time.  Skin: Skin is warm and dry.  Psychiatric: She has a normal mood and affect.          Assessment & Plan:  Acute sinusitis - call if not significantly better in one week. We'll treat with Augmentin. Continue symptomatic care. Avoid things that are drying such as nasal sprays and decongestants if she or he feels like she has very dry membranes. She might even want to run a humidifier at night.

## 2012-10-03 NOTE — Patient Instructions (Signed)

## 2012-10-15 ENCOUNTER — Other Ambulatory Visit: Payer: Self-pay | Admitting: Family Medicine

## 2012-12-05 ENCOUNTER — Other Ambulatory Visit: Payer: Self-pay | Admitting: Family Medicine

## 2012-12-05 NOTE — Telephone Encounter (Signed)
I wanted to ask if this was ok to send. I dont see why she needs rx Ibuprofen other than she was using it for sinus HA and the last notes I see she states it upset her stomach and had been using it quite often. Please advise

## 2013-01-23 ENCOUNTER — Ambulatory Visit: Payer: BC Managed Care – PPO | Admitting: Physician Assistant

## 2013-01-30 ENCOUNTER — Other Ambulatory Visit: Payer: Self-pay | Admitting: Family Medicine

## 2013-02-02 ENCOUNTER — Telehealth: Payer: Self-pay | Admitting: *Deleted

## 2013-02-02 MED ORDER — AMPHETAMINE-DEXTROAMPHETAMINE 15 MG PO TABS: 15.0000 mg | ORAL_TABLET | Freq: Every day | ORAL | Status: DC

## 2013-02-02 NOTE — Telephone Encounter (Signed)
LMOM that rx will be ready for pickup in the morning and needs to schedule an appointment. Clemetine Marker, LPN

## 2013-02-02 NOTE — Telephone Encounter (Signed)
Pt calls and request a refill on the Aderrall and states she only takes 1/2 tab now.  She needs appointment how many do you want to give her

## 2013-02-02 NOTE — Telephone Encounter (Signed)
Ok for 30 tabs.

## 2013-03-05 ENCOUNTER — Encounter: Payer: BC Managed Care – PPO | Admitting: Family Medicine

## 2013-03-16 ENCOUNTER — Encounter: Payer: Self-pay | Admitting: Family Medicine

## 2013-03-16 ENCOUNTER — Other Ambulatory Visit: Payer: Self-pay | Admitting: Family Medicine

## 2013-03-16 ENCOUNTER — Ambulatory Visit (INDEPENDENT_AMBULATORY_CARE_PROVIDER_SITE_OTHER): Payer: BC Managed Care – PPO | Admitting: Family Medicine

## 2013-03-16 VITALS — BP 122/74 | HR 96 | Temp 98.2°F | Ht 69.0 in | Wt 165.0 lb

## 2013-03-16 DIAGNOSIS — Z1231 Encounter for screening mammogram for malignant neoplasm of breast: Secondary | ICD-10-CM

## 2013-03-16 DIAGNOSIS — J329 Chronic sinusitis, unspecified: Secondary | ICD-10-CM

## 2013-03-16 DIAGNOSIS — Z0000 Encounter for general adult medical examination without abnormal findings: Secondary | ICD-10-CM

## 2013-03-16 MED ORDER — MOXIFLOXACIN HCL 400 MG PO TABS: 400.0000 mg | ORAL_TABLET | Freq: Every day | ORAL | Status: DC

## 2013-03-16 MED ORDER — TRAZODONE HCL 50 MG PO TABS
25.0000 mg | ORAL_TABLET | Freq: Every evening | ORAL | Status: DC | PRN
Start: 2013-03-16 — End: 2013-07-13

## 2013-03-16 NOTE — Progress Notes (Signed)
  Subjective:     Lacey Jensen is a 48 y.o. female and is here for a comprehensive physical exam. The patient reports no problems.  History   Social History  . Marital Status: Married    Spouse Name: Gwyndolyn Saxon    Number of Children: 2  . Years of Education: N/A   Occupational History  . Mortgage broker    Social History Main Topics  . Smoking status: Former Smoker    Quit date: 01/23/2004  . Smokeless tobacco: Never Used  . Alcohol Use: 0.5 oz/week    1 drink(s) per week  . Drug Use: No  . Sexual Activity: Yes   Other Topics Concern  . Not on file   Social History Narrative   She was adopted so we do not have an extensive family history. She does yoga for exercise and drinks 2 caffeinated drinks per day.   Health Maintenance  Topic Date Due  . Influenza Vaccine  07/03/2013 (Originally 08/22/2012)  . Pap Smear  06/19/2014  . Tetanus/tdap  01/23/2019    The following portions of the patient's history were reviewed and updated as appropriate: allergies, current medications, past family history, past medical history, past social history, past surgical history and problem list.  Review of Systems A comprehensive review of systems was negative.   Objective:    BP 122/74  Pulse 96  Temp(Src) 98.2 F (36.8 C)  Ht 5\' 9"  (1.753 m)  Wt 165 lb (74.844 kg)  BMI 24.36 kg/m2  SpO2 97%  LMP 02/16/2013 General appearance: alert, cooperative and appears stated age Head: Normocephalic, without obvious abnormality, atraumatic Eyes: conj clear, EOMi, PEERLA Ears: normal TM's and external ear canals both ears Nose: Nares normal. Septum midline. Mucosa normal. No drainage or sinus tenderness. Throat: lips, mucosa, and tongue normal; teeth and gums normal Neck: no adenopathy, no carotid bruit, no JVD, supple, symmetrical, trachea midline and thyroid not enlarged, symmetric, no tenderness/mass/nodules Back: symmetric, no curvature. ROM normal. No CVA tenderness. Lungs: clear to  auscultation bilaterally Heart: regular rate and rhythm, S1, S2 normal, no murmur, click, rub or gallop Abdomen: soft, non-tender; bowel sounds normal; no masses,  no organomegaly Extremities: extremities normal, atraumatic, no cyanosis or edema Pulses: 2+ and symmetric Skin: Skin color, texture, turgor normal. No rashes or lesions Lymph nodes: Cervical, supraclavicular, and axillary nodes normal. Neurologic: Alert and oriented X 3, normal strength and tone. Normal symmetric reflexes. Normal coordination and gait    Assessment:    Healthy female exam.      Plan:     See After Visit Summary for Counseling Recommendations  Keep up a regular exercise program and make sure you are eating a healthy diet Try to eat 4 servings of dairy a day, or if you are lactose intolerant take a calcium with vitamin D daily.  Your vaccines are up to date.   Insomnia - Say melatonin is giving her bad dreams and itching. occc uses benadryl.  Will try trazodone instead. Warned about sedation and operating a motor vehicle. Make sure take early enough that the sedative effects have worn off in the morning. Will treat with Avelox and refer to ENT for recurrent sinus infections.  Acute sinusitis - 2 weeks of significant congestion.  Using saline and mucinex D.  No fever. Last tx in September.  No fever.  Left sinus pain and HA.  Will tx with Avelox and refer to ENT for futher evaluation.

## 2013-03-16 NOTE — Patient Instructions (Signed)
Keep up a regular exercise program and make sure you are eating a healthy diet Try to eat 4 servings of dairy a day, or if you are lactose intolerant take a calcium with vitamin D daily.  Your vaccines are up to date.   

## 2013-03-18 ENCOUNTER — Other Ambulatory Visit: Payer: Self-pay | Admitting: Family Medicine

## 2013-03-18 DIAGNOSIS — N6001 Solitary cyst of right breast: Secondary | ICD-10-CM

## 2013-03-23 ENCOUNTER — Telehealth: Payer: Self-pay | Admitting: *Deleted

## 2013-03-23 NOTE — Telephone Encounter (Signed)
Pt called and stated that she is having continued sinus pressure and Headache. She has finished the ABX today and has been using the nasal spray. She has been using IBU and cool and warm compresses, saline rinse and standing in warm shower which helps however the pressure comes back. She have an appt scheduled w/ENT on 04/06/13. Please advise.Lacey Jensen

## 2013-03-24 MED ORDER — AZITHROMYCIN 250 MG PO TABS: ORAL_TABLET | ORAL | Status: DC

## 2013-03-24 NOTE — Telephone Encounter (Signed)
I will send over zpack which will cover for atypicals since not better on the avelox.

## 2013-03-24 NOTE — Telephone Encounter (Signed)
Called and informed pt via VM that z-pack has been sent.Lacey Jensen

## 2013-03-29 ENCOUNTER — Other Ambulatory Visit: Payer: Self-pay | Admitting: Family Medicine

## 2013-03-31 ENCOUNTER — Encounter: Payer: Self-pay | Admitting: Family Medicine

## 2013-03-31 ENCOUNTER — Telehealth: Payer: Self-pay | Admitting: *Deleted

## 2013-03-31 ENCOUNTER — Ambulatory Visit (INDEPENDENT_AMBULATORY_CARE_PROVIDER_SITE_OTHER): Payer: BC Managed Care – PPO | Admitting: Family Medicine

## 2013-03-31 ENCOUNTER — Ambulatory Visit (INDEPENDENT_AMBULATORY_CARE_PROVIDER_SITE_OTHER): Payer: BC Managed Care – PPO

## 2013-03-31 VITALS — BP 135/81 | HR 100 | Temp 100.3°F | Wt 164.0 lb

## 2013-03-31 DIAGNOSIS — R0989 Other specified symptoms and signs involving the circulatory and respiratory systems: Secondary | ICD-10-CM

## 2013-03-31 DIAGNOSIS — J209 Acute bronchitis, unspecified: Secondary | ICD-10-CM

## 2013-03-31 DIAGNOSIS — R509 Fever, unspecified: Secondary | ICD-10-CM

## 2013-03-31 DIAGNOSIS — R05 Cough: Secondary | ICD-10-CM

## 2013-03-31 DIAGNOSIS — J019 Acute sinusitis, unspecified: Secondary | ICD-10-CM

## 2013-03-31 DIAGNOSIS — R059 Cough, unspecified: Secondary | ICD-10-CM

## 2013-03-31 MED ORDER — HYDROCODONE-HOMATROPINE 5-1.5 MG/5ML PO SYRP: 5.0000 mL | ORAL_SOLUTION | Freq: Four times a day (QID) | ORAL | Status: DC | PRN

## 2013-03-31 MED ORDER — DOXYCYCLINE HYCLATE 100 MG PO TABS: 100.0000 mg | ORAL_TABLET | Freq: Two times a day (BID) | ORAL | Status: DC

## 2013-03-31 NOTE — Telephone Encounter (Signed)
Pt called back and stated that her husband is now sick and was given a Z-pack and she is running a fever 101.0. She is at work now and they are sending her home.  appt made for today @ 1pm and pt told to get her early to be seen and to go to have xray of chest done prior to visit .Lacey Jensen

## 2013-03-31 NOTE — Telephone Encounter (Signed)
Pt calls and LM on triage line that she is not much better after finishing the Zpak you gave her.  Has a really hard cough and chest congestion.  Has been on 2 rounds of antibiotics already.  I was looking in the notes and she has an ENT appointment on 3/16.  Please advise

## 2013-03-31 NOTE — Progress Notes (Signed)
   Subjective:    Patient ID: Lacey Jensen, female    DOB: 1965/02/06, 48 y.o.   MRN: [id number]  HPI 3 weeks of sinus and chest congestion. She started running a fever again today 101 at work. She completed a course of Levaquin and had called in and said she really wasn't much better so we decided: Azithromycin to cover for any atypicals. Related that has not helped as well. Temperatures 100.3 here today.+ HA.  Not a lot of nasal drainage. Feels achey all over. Chest feels tight. No history of asthma. Cough is dry. Fever started last night.  Taking iibuprofen. Husband with similar sxs. She is supposed to fly to [place] in 2 days.   Review of Systems     Objective:   Physical Exam  Constitutional: She is oriented to person, place, and time. She appears well-developed and well-nourished.  HENT:  Head: Normocephalic and atraumatic.  Right Ear: External ear normal.  Left Ear: External ear normal.  Nose: Nose normal.  Mouth/Throat: Oropharynx is clear and moist.  TMs and canals are clear.   Eyes: Conjunctivae and EOM are normal. Pupils are equal, round, and reactive to light.  Neck: Neck supple. No thyromegaly present.  Cardiovascular: Normal rate, regular rhythm and normal heart sounds.   Pulmonary/Chest: Effort normal and breath sounds normal. She has no wheezes.  Lymphadenopathy:    She has no cervical adenopathy.  Neurological: She is alert and oriented to person, place, and time.  Skin: Skin is warm and dry.  Psychiatric: She has a normal mood and affect.          Assessment & Plan:  Acute sinusitis/bronchitis- will get chest x-rays and pulse ox is down just slightly and she's having tightness in her chest. Highest fever was 101.5 today. We'll also saw her for influenza. Test for flu was negative. Given prescription for doxycycline since she has failed a fluoroquinolone and a macrolide. Also given prescription cough medication. Chest x-ray was normal. Also consider that this  could be a new onset viral illness. It is certainly a consideration. Recommend rest, hydration. Wrote her out of work for the next 2 days.

## 2013-04-06 ENCOUNTER — Inpatient Hospital Stay: Admission: RE | Admit: 2013-04-06 | Payer: BC Managed Care – PPO | Source: Ambulatory Visit

## 2013-04-06 ENCOUNTER — Other Ambulatory Visit: Payer: BC Managed Care – PPO

## 2013-04-16 ENCOUNTER — Other Ambulatory Visit: Payer: Self-pay | Admitting: Family Medicine

## 2013-04-28 ENCOUNTER — Telehealth: Payer: Self-pay | Admitting: *Deleted

## 2013-04-28 DIAGNOSIS — B001 Herpesviral vesicular dermatitis: Secondary | ICD-10-CM

## 2013-04-28 MED ORDER — VALACYCLOVIR HCL 500 MG PO TABS: 500.0000 mg | ORAL_TABLET | Freq: Two times a day (BID) | ORAL | Status: DC

## 2013-04-28 NOTE — Telephone Encounter (Signed)
Pt called adv "fever blister came on lip while at beach" and wanted Valtrex sent to pharm - will send e-script.

## 2013-05-07 ENCOUNTER — Other Ambulatory Visit: Payer: Self-pay | Admitting: Family Medicine

## 2013-06-09 ENCOUNTER — Other Ambulatory Visit: Payer: Self-pay | Admitting: Advanced Practice Midwife

## 2013-07-13 ENCOUNTER — Ambulatory Visit (INDEPENDENT_AMBULATORY_CARE_PROVIDER_SITE_OTHER): Payer: BC Managed Care – PPO | Admitting: Family Medicine

## 2013-07-13 ENCOUNTER — Encounter: Payer: Self-pay | Admitting: Family Medicine

## 2013-07-13 VITALS — BP 125/82 | HR 98 | Wt 162.0 lb

## 2013-07-13 DIAGNOSIS — K649 Unspecified hemorrhoids: Secondary | ICD-10-CM

## 2013-07-13 DIAGNOSIS — K5900 Constipation, unspecified: Secondary | ICD-10-CM

## 2013-07-13 DIAGNOSIS — F909 Attention-deficit hyperactivity disorder, unspecified type: Secondary | ICD-10-CM

## 2013-07-13 DIAGNOSIS — G43009 Migraine without aura, not intractable, without status migrainosus: Secondary | ICD-10-CM

## 2013-07-13 DIAGNOSIS — K648 Other hemorrhoids: Secondary | ICD-10-CM

## 2013-07-13 MED ORDER — BUTALBITAL-APAP-CAFFEINE 50-325-40 MG PO TABS: 1.0000 | ORAL_TABLET | Freq: Four times a day (QID) | ORAL | Status: DC | PRN

## 2013-07-13 MED ORDER — AMPHETAMINE-DEXTROAMPHETAMINE 15 MG PO TABS: 15.0000 mg | ORAL_TABLET | Freq: Every day | ORAL | Status: DC

## 2013-07-13 NOTE — Progress Notes (Signed)
   Subjective:    Patient ID: Lacey Jensen, female    DOB: [birth year], 48 y.o.   MRN: [id number]  HPI F/u common Migraine - Using ibuprofen so much that it is causing constipation. Has been off the IBU now for about 5 days. Did get a massage over the weekend and tht didn help. She says now her hemorrhoids are flaed and she is using Prepreration-H. Work has been really stressed.    ADHD-she does need a refill on her medication today. No chest pain shortness of breath. Review of Systems     Objective:   Physical Exam  Constitutional: She is oriented to person, place, and time. She appears well-developed and well-nourished.  HENT:  Head: Normocephalic and atraumatic.  Right Ear: External ear normal.  Left Ear: External ear normal.  Nose: Nose normal.  Mouth/Throat: Oropharynx is clear and moist.  TMs and canals are clear.   Eyes: Conjunctivae and EOM are normal. Pupils are equal, round, and reactive to light.  Neck: Neck supple. No thyromegaly present.  Cardiovascular: Normal rate, regular rhythm and normal heart sounds.   Pulmonary/Chest: Effort normal and breath sounds normal. She has no wheezes.  Lymphadenopathy:    She has no cervical adenopathy.  Neurological: She is alert and oriented to person, place, and time. No cranial nerve deficit.  Skin: Skin is warm and dry.  Psychiatric: She has a normal mood and affect.          Assessment & Plan:  Migraine HA - she's tried to tryptans in the past and did not do well. She really wants to stay away from the tryptans if at all possible. Thus we will try Fioricet which has a Tylenol products so that hopefully will not upset her stomach like the intensive been doing. We also discussed that sometimes she can get a rebound phenomenon by taking a lot of over-the-counter pain relievers which is sounds like she might have been experiencing. Now that she's been off the ibuprofen for about 5 days she actually is feeling little bit better for  her headache is not completely resolved. Followup in one month if not getting relief with the Fioricet.  Constipation - has already started a stool softerner.  It seems to be helping. Also recommend increase fiber in diet and water in diet as well. Call if any problems or not resolving.  Hemorrhoids-she is treating with over-the-counter preparation H. If not resolving on its own then please let me know. Stay away from anti-inflammatories for little while. Avoid aspirin products as well.  ADHD-well controlled on current regimen. Refill provided today. Blood pressure well controlled. Followup in 3 months.

## 2013-09-14 ENCOUNTER — Ambulatory Visit: Payer: BC Managed Care – PPO | Admitting: Family Medicine

## 2013-10-01 ENCOUNTER — Telehealth: Payer: Self-pay | Admitting: *Deleted

## 2013-10-01 NOTE — Telephone Encounter (Signed)
Pt lvm asking about the forms that were be filled out for her. She stated that she had received a vm that said that the forms were incomplete. Referred this to [place] for f/u since she has been working on these.Lacey Jensen [place]

## 2013-10-01 NOTE — Telephone Encounter (Signed)
Spoke with pt this afternoon.  Advised her that she needs tdap, hep b, & second mmr.  Pt was unsure if she has ever had the chicken pox.  She worked as a Psychologist, occupational at [employer] for 3 years and they gave her some shots but she didn't know what they were so she is going to try to get copies of what they did & have them faxed to [place].

## 2013-10-16 ENCOUNTER — Other Ambulatory Visit: Payer: Self-pay | Admitting: Family Medicine

## 2013-10-17 ENCOUNTER — Other Ambulatory Visit: Payer: Self-pay | Admitting: Family Medicine

## 2013-10-28 ENCOUNTER — Telehealth: Payer: Self-pay

## 2013-10-28 DIAGNOSIS — Z0184 Encounter for antibody response examination: Secondary | ICD-10-CM

## 2013-10-28 NOTE — Telephone Encounter (Signed)
Lacey Jensen has a nurse visit next week for immunizations. Please advise which immunizations she needs and if she will need a titer for chicken pox.

## 2013-10-28 NOTE — Telephone Encounter (Signed)
Patient called and left a message on nurse line asking for a return call.   Returned Call: Left message asking patient to call back.  

## 2013-10-29 NOTE — Telephone Encounter (Signed)
Did she get her vaccines faxed from [place] not sure.  Prior phone notes mentions Hep B etc.

## 2013-11-03 NOTE — Telephone Encounter (Signed)
She was unable to receive her records from [facility].

## 2013-11-03 NOTE — Telephone Encounter (Signed)
Can given Tdap . We can do immunity testing for Measles, mump, rubella and possibly Hep B.  labslip printed.

## 2013-11-05 ENCOUNTER — Ambulatory Visit: Payer: BC Managed Care – PPO

## 2013-11-23 ENCOUNTER — Encounter: Payer: Self-pay | Admitting: Family Medicine

## 2013-11-28 ENCOUNTER — Other Ambulatory Visit: Payer: Self-pay | Admitting: Family Medicine

## 2013-12-02 ENCOUNTER — Ambulatory Visit (INDEPENDENT_AMBULATORY_CARE_PROVIDER_SITE_OTHER): Payer: BC Managed Care – PPO | Admitting: Family Medicine

## 2013-12-02 ENCOUNTER — Encounter: Payer: Self-pay | Admitting: Family Medicine

## 2013-12-02 ENCOUNTER — Other Ambulatory Visit: Payer: Self-pay | Admitting: Family Medicine

## 2013-12-02 VITALS — BP 119/72 | HR 87 | Wt 165.0 lb

## 2013-12-02 DIAGNOSIS — G4700 Insomnia, unspecified: Secondary | ICD-10-CM

## 2013-12-02 DIAGNOSIS — F902 Attention-deficit hyperactivity disorder, combined type: Secondary | ICD-10-CM

## 2013-12-02 DIAGNOSIS — Z111 Encounter for screening for respiratory tuberculosis: Secondary | ICD-10-CM | POA: Diagnosis not present

## 2013-12-02 DIAGNOSIS — Z23 Encounter for immunization: Secondary | ICD-10-CM | POA: Diagnosis not present

## 2013-12-02 DIAGNOSIS — F3281 Premenstrual dysphoric disorder: Secondary | ICD-10-CM

## 2013-12-02 DIAGNOSIS — N943 Premenstrual tension syndrome: Secondary | ICD-10-CM

## 2013-12-02 DIAGNOSIS — M25511 Pain in right shoulder: Secondary | ICD-10-CM

## 2013-12-02 MED ORDER — AMPHETAMINE-DEXTROAMPHETAMINE 15 MG PO TABS: 15.0000 mg | ORAL_TABLET | Freq: Every day | ORAL | Status: DC

## 2013-12-02 MED ORDER — MELOXICAM 15 MG PO TABS: 15.0000 mg | ORAL_TABLET | Freq: Every day | ORAL | Status: DC

## 2013-12-02 MED ORDER — TRAZODONE HCL 50 MG PO TABS: 25.0000 mg | ORAL_TABLET | Freq: Every evening | ORAL | Status: DC | PRN

## 2013-12-02 MED ORDER — NARATRIPTAN HCL 1 MG PO TABS: 1.0000 mg | ORAL_TABLET | Freq: Once | ORAL | Status: DC | PRN

## 2013-12-02 NOTE — Patient Instructions (Signed)
STOP your ibuprofen and try the meloxicam.

## 2013-12-02 NOTE — Progress Notes (Signed)
   Subjective:    Patient ID: Lacey Jensen, female    DOB: Sep 22, 1965, 48 y.o.   MRN: [id number]  HPI F/U migraines - she feels like the Fioricet is not working well to abort her migraines. Makes her   ADHD-she feels that her medication is working well but she still having some problems with sleep.  Insomnia- not sleeping well. Says would like to try the trazodone again as well.  She has been using melatonin that it tends to give her nightmares.  Right shoulder pain for little over one month. She denies any injury or trauma. It's just been very sore especially with range of motion. 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 It's been painful to sleep on it and some nights action keeps her up. She's been taking 800 mg about the present 3 times a day and it does give her some mild relief.  Follow-up premenstrual dysphoric disorder-she feels that the fluoxetine is working well to control her symptoms.  Review of Systems     Objective:   Physical Exam  Constitutional: She is oriented to person, place, and time. She appears well-developed and well-nourished.  HENT:  Head: Normocephalic and atraumatic.  Cardiovascular: Normal rate, regular rhythm and normal heart sounds.   Pulmonary/Chest: Effort normal and breath sounds normal.  Musculoskeletal:  Neck was normal flexion Konstantin, rotation right left, side bending. She's nontender over the shoulder joint itself. Normal range of motion.she does have some discomfort with full extension. Negative entity can directly cause discomfort and pain.  Neurological: She is alert and oriented to person, place, and time.  Skin: Skin is warm and dry.  Psychiatric: She has a normal mood and affect. Her behavior is normal.          Assessment & Plan:  Migraine headaches-not currently well-controlled. She gets some relief with Fioricet but it's very sedating so trust only take it when  she's at home. She did not do well with Imitrex or so Jinny Blossom the past. It made her feel high. Will try a low dose of a merge and see if she tolerates as well as that she would have something she can take during the daytime. Make sure to take at the very beginning of the headache as it will likely be more effective.  ADHD-tolerant medication well without any problems. Refills given for the next 90 days.  Insomnia-will discontinue melatonin and try the trazodone again.  Right shoulder pain-most likely bursitis. Will discontinue hybrid precedents which are to meloxicam. I think this will lower her risk for G.I. Irritation's and she's getting a little bit stomach upset with the ibuprofen. Make sure to take with food and water. Given a handout on exercises to do on her own home. If she's not improving over the next 2 to 3 weeks and I recommended she see her sports medicine doctor for further evaluation.  Premenstrual dysphoric disorder-continue fluoxetine since working well.  Immunizations-she needs a form completed for work. 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 She is fairly certain she's had the hepatitis B series. Will do tires for this today. She also needs a 2nd MMR V, but we will check for immunity for this as well. Her teeth out and flu vaccines were updated and given today. TB skin test placed. Will complete form once titers are resulted.

## 2013-12-03 LAB — MEASLES/MUMPS/RUBELLA IMMUNITY
Rubella: 16.6 Index — ABNORMAL HIGH (ref <0.90)
Rubeola IgG: 214 AU/mL — ABNORMAL HIGH (ref <25.00)

## 2013-12-03 LAB — HEPATITIS B SURFACE ANTIBODY,QUALITATIVE: Hep B S Ab: NEGATIVE

## 2013-12-04 ENCOUNTER — Telehealth: Payer: Self-pay | Admitting: Family Medicine

## 2013-12-04 ENCOUNTER — Ambulatory Visit (INDEPENDENT_AMBULATORY_CARE_PROVIDER_SITE_OTHER): Payer: BC Managed Care – PPO | Admitting: Family Medicine

## 2013-12-04 VITALS — Temp 98.0°F

## 2013-12-04 DIAGNOSIS — Z23 Encounter for immunization: Secondary | ICD-10-CM | POA: Diagnosis not present

## 2013-12-04 DIAGNOSIS — Z789 Other specified health status: Secondary | ICD-10-CM

## 2013-12-04 LAB — TB SKIN TEST
INDURATION: 0 mm
TB Skin Test: NEGATIVE

## 2013-12-04 NOTE — Telephone Encounter (Signed)
Sent Pre Authorization to [facility] for Adderall 15mg  tablets.  Received approval and faxed to [facility] @ [phone].  WB

## 2013-12-04 NOTE — Progress Notes (Signed)
   Subjective:    Patient ID: Lacey Jensen, female    DOB: Apr 21, 1965, 48 y.o.   MRN: [id number]  HPI  Lacey Jensen is here to start Hep B vaccine.   Review of Systems     Objective:   Physical Exam        Assessment & Plan:  Patient tolerated injection well without complications. Patient advised to schedule next injection 30 days from today.

## 2013-12-08 LAB — VARICELLA ZOSTER IGG AND IGM
Varicella IgG: 1958 Index — ABNORMAL HIGH (ref <135.00)
Varicella Zoster Ab IgM: 0.47 {ISR} (ref <0.91)

## 2013-12-16 ENCOUNTER — Other Ambulatory Visit: Payer: Self-pay | Admitting: *Deleted

## 2013-12-16 ENCOUNTER — Other Ambulatory Visit: Payer: Self-pay | Admitting: Family Medicine

## 2013-12-16 MED ORDER — FLUOXETINE HCL 20 MG PO TABS: 20.0000 mg | ORAL_TABLET | Freq: Every day | ORAL | Status: DC

## 2014-02-01 ENCOUNTER — Encounter: Payer: Self-pay | Admitting: Family Medicine

## 2014-02-01 ENCOUNTER — Ambulatory Visit (INDEPENDENT_AMBULATORY_CARE_PROVIDER_SITE_OTHER): Payer: BC Managed Care – PPO | Admitting: Family Medicine

## 2014-02-01 VITALS — BP 126/74 | HR 90 | Wt 166.0 lb

## 2014-02-01 DIAGNOSIS — G43009 Migraine without aura, not intractable, without status migrainosus: Secondary | ICD-10-CM

## 2014-02-01 DIAGNOSIS — Z13220 Encounter for screening for lipoid disorders: Secondary | ICD-10-CM | POA: Diagnosis not present

## 2014-02-01 DIAGNOSIS — M7551 Bursitis of right shoulder: Secondary | ICD-10-CM | POA: Diagnosis not present

## 2014-02-01 MED ORDER — NARATRIPTAN HCL 1 MG PO TABS: 1.0000 mg | ORAL_TABLET | Freq: Once | ORAL | Status: DC | PRN

## 2014-02-01 MED ORDER — PROPRANOLOL HCL 40 MG PO TABS: 40.0000 mg | ORAL_TABLET | Freq: Two times a day (BID) | ORAL | Status: DC

## 2014-02-01 NOTE — Progress Notes (Signed)
   Subjective:    Patient ID: Lacey Jensen, female    DOB: [birth year], 49 y.o.   MRN: [id number]  HPI Migraine - has tried the Amerge. Says has already used all 9. Says seems to be working well.  Has tried topamax in the past it did not do well on it. She is Re: Used 9 tabs of her Amerge in the last 2 and half weeks as well as about 2 tabs of her butalbital.  Right shoulder bursitis-she has been doing exercises and has noticed some increase in range of motion but still having persistent pain. Notices it most if she tries to sleep on that shoulder or she's changing gear shifts in her car. He will ask to cause a tingle down her arm at times.  Review of Systems     Objective:   Physical Exam  Constitutional: She is oriented to person, place, and time. She appears well-developed and well-nourished.  HENT:  Head: Normocephalic and atraumatic.  Cardiovascular: Normal rate, regular rhythm and normal heart sounds.   Pulmonary/Chest: Effort normal and breath sounds normal.  Musculoskeletal:  Right shoulder with normal range of motion. Nontender. She's able to reach and crossover and reached on her back. It is uncomfortable.  Neurological: She is alert and oriented to person, place, and time.  Skin: Skin is warm and dry.  Psychiatric: She has a normal mood and affect. Her behavior is normal.          Assessment & Plan:  Migraine - not well controlled. She's arteries 9 tabs of her Amerge in 2 and half weeks. Discussed need for controller. She thinks she tried Topamax in the past and had side effects with it. We will try propranolol instead. Follow-up in 2 months. Monitor for any lightheadedness dizziness or weakness on the medication. E it fill her Amerge today as well.  Right shoulder burisitis -discussed options. She would prefer to have an injection. Continue with home exercises since she has gone some improvement with range of motion with this. Asked her to call in about 2 weeks if she  doesn't get any relief and we will schedule her with our sports medicine physician.  Shoulder Injection Procedure Note  Pre-operative Diagnosis: right shoulder bursitis   Post-operative Diagnosis: same  Indications: Diagnostic and pain relief  Anesthesia: Lidocaine 1% without epinephrine without added sodium bicarbonate  Procedure Details   Verbal consent was obtained for the procedure. The shoulder was prepped with iodine and the skin was anesthetized. Using a 22 gauge needle the glenohumeral joint is injected with 9 mL 1% lidocaine and 1 mL of triamcinolone (KENALOG) 40mg /ml under the posterior aspect of the acromion. The injection site was cleansed with topical isopropyl alcohol and a dressing was applied.  Complications:  None; patient tolerated the procedure well.

## 2014-02-17 ENCOUNTER — Ambulatory Visit (INDEPENDENT_AMBULATORY_CARE_PROVIDER_SITE_OTHER): Payer: BC Managed Care – PPO | Admitting: Physician Assistant

## 2014-02-17 ENCOUNTER — Encounter: Payer: Self-pay | Admitting: Physician Assistant

## 2014-02-17 VITALS — BP 109/69 | HR 72 | Temp 97.5°F | Wt 165.0 lb

## 2014-02-17 DIAGNOSIS — J0110 Acute frontal sinusitis, unspecified: Secondary | ICD-10-CM | POA: Diagnosis not present

## 2014-02-17 MED ORDER — AMOXICILLIN-POT CLAVULANATE 875-125 MG PO TABS: 1.0000 | ORAL_TABLET | Freq: Two times a day (BID) | ORAL | Status: DC

## 2014-02-17 NOTE — Progress Notes (Signed)
   Subjective:    Patient ID: Lacey Jensen, female    DOB: 13-Jul-1965, 49 y.o.   MRN: [id number]  HPI Patient is a 49 year old female who presents to the clinic with 2 weeks of sinus pressure, nasal congestion, headache, ear pressure, sore throat and cough. She's been using Mucinex D for the last 7 days. It has provided little relief. She's also been using NyQuil at night for cough which has helped. She continues to have a lot of frontal pressure. She reports her teeth ache. She denies any fever, chills, wheezing or shortness of breath.   Review of Systems  All other systems reviewed and are negative.      Objective:   Physical Exam  Constitutional: She is oriented to person, place, and time. She appears well-developed and well-nourished.  HENT:  Head: Normocephalic and atraumatic.  TMs clear bilaterally with a slight bulging membrane likely due to some fluid buildup.  Oropharynx is erythematous with some postnasal drip present. No tonsillar swelling or exudate.  Significant tenderness over frontal sinuses to palpation bilaterally.  Bilateral nasal turbinates are red and swollen.  Eyes: Conjunctivae are normal. Right eye exhibits no discharge. Left eye exhibits no discharge.  Neck: Normal range of motion. Neck supple.  Cardiovascular: Normal rate, regular rhythm and normal heart sounds.   Pulmonary/Chest: Effort normal and breath sounds normal. She has no wheezes.  Neurological: She is alert and oriented to person, place, and time.  Skin: Skin is dry.  Psychiatric: She has a normal mood and affect. Her behavior is normal.          Assessment & Plan:  Acute frontal sinusitis-treated with Augmentin for 10 days. Encourage patient to continue using NyQuil for cough suppression. Discussed the cough is likely due to sinus drainage. Her lungs sounded great today. Handout given. Follow-up as needed if not improving.

## 2014-02-17 NOTE — Patient Instructions (Signed)

## 2014-02-19 ENCOUNTER — Other Ambulatory Visit: Payer: Self-pay | Admitting: Physician Assistant

## 2014-02-19 ENCOUNTER — Telehealth: Payer: Self-pay

## 2014-02-19 MED ORDER — PREDNISONE 50 MG PO TABS: ORAL_TABLET | ORAL | Status: DC

## 2014-02-19 NOTE — Telephone Encounter (Signed)
Left detailed message.   

## 2014-02-19 NOTE — Telephone Encounter (Signed)
Patient called and left a message stating she still is sick and has pressure in face. She also stated Jade asked her to call back if she wasn't better. Please advise.

## 2014-02-19 NOTE — Telephone Encounter (Signed)
Ok sent prednisone to pharmacy. Can use flonase 2 sprays each nostril as well.

## 2014-03-01 ENCOUNTER — Telehealth: Payer: Self-pay | Admitting: *Deleted

## 2014-03-01 DIAGNOSIS — B009 Herpesviral infection, unspecified: Secondary | ICD-10-CM

## 2014-03-01 MED ORDER — VALACYCLOVIR HCL 500 MG PO TABS: 500.0000 mg | ORAL_TABLET | Freq: Two times a day (BID) | ORAL | Status: DC

## 2014-03-01 NOTE — Telephone Encounter (Signed)
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  This is in her history and is seeing Dr Gala Romney on 03/16/14.  She does have a fever blister populating on her lip now.

## 2014-03-16 ENCOUNTER — Ambulatory Visit (INDEPENDENT_AMBULATORY_CARE_PROVIDER_SITE_OTHER): Payer: BC Managed Care – PPO | Admitting: Obstetrics & Gynecology

## 2014-03-16 ENCOUNTER — Encounter: Payer: Self-pay | Admitting: Obstetrics & Gynecology

## 2014-03-16 VITALS — BP 111/66 | HR 78 | Resp 16 | Ht 69.0 in | Wt 165.0 lb

## 2014-03-16 DIAGNOSIS — Z01419 Encounter for gynecological examination (general) (routine) without abnormal findings: Secondary | ICD-10-CM

## 2014-03-16 MED ORDER — HYDROCORTISONE ACE-PRAMOXINE 1-1 % RE FOAM: 1.0000 | Freq: Two times a day (BID) | RECTAL | Status: DC

## 2014-03-16 NOTE — Progress Notes (Signed)
  Subjective:     Lacey Jensen is a 49 y.o. female here for a routine exam.  Current complaints: mild hot flashes.  Still has monthly menses.  Does not want repeat mammogram until 50--still in accordance to USPFTF recommendations.  Negative family history for cancer.  Pt complaining of hemorrhoids.  Not hard or painful currently.  Gynecologic History Patient's last menstrual period was 02/18/2014. Contraception: tubal ligation Last Pap: 2015. Results were: normal Last mammogram: 2015. Results were: normal  Obstetric History OB History  Gravida Para Term Preterm AB SAB TAB Ectopic Multiple Living  2 2        2     # Outcome Date GA Lbr Len/2nd Weight Sex Delivery Anes PTL Lv  2 Para      Vag-Spont     1 Para      Vag-Spont          The following portions of the patient's history were reviewed and updated as appropriate: allergies, current medications, past family history, past medical history, past social history, past surgical history and problem list.  Review of Systems Pertinent items are noted in HPI.    Objective:      Filed Vitals:   03/16/14 1448  BP: 111/66  Pulse: 78  Resp: 16  Height: 5\' 9"  (1.753 m)  Weight: 165 lb (74.844 kg)   Vitals:  WNL General appearance: alert, cooperative and no distress Head: Normocephalic, without obvious abnormality, atraumatic Eyes: negative Throat: lips, mucosa, and tongue normal; teeth and gums normal Lungs: clear to auscultation bilaterally Breasts: normal appearance, no masses or tenderness, No nipple retraction or dimpling, No nipple discharge or bleeding Heart: regular rate and rhythm Abdomen: soft, non-tender; bowel sounds normal; no masses,  no organomegaly  Pelvic:  External Genitalia:  Tanner V, no lesion Urethra:  No prolpase Vagina:  Pink, normal rugae, no blood or discharge Cervix:  No CMT, no lesion Uterus:  Normal size and contour, non tender Adnexa:  Normal, no masses, non tender  Extremities: no edema,  redness or tenderness in the calves or thighs Skin: no lesions or rash Lymph nodes: Axillary adenopathy: none        Assessment:    Healthy female exam.   Hemorrhoids    Plan:   Pap next year Mammogram at 49 Proctofoam for hemorrhoids

## 2014-04-23 ENCOUNTER — Other Ambulatory Visit: Payer: Self-pay | Admitting: Family Medicine

## 2014-05-07 ENCOUNTER — Encounter: Payer: Self-pay | Admitting: Physician Assistant

## 2014-05-07 ENCOUNTER — Ambulatory Visit (INDEPENDENT_AMBULATORY_CARE_PROVIDER_SITE_OTHER): Payer: BC Managed Care – PPO | Admitting: Physician Assistant

## 2014-05-07 VITALS — BP 120/80 | HR 58 | Wt 163.0 lb

## 2014-05-07 DIAGNOSIS — J0140 Acute pansinusitis, unspecified: Secondary | ICD-10-CM | POA: Diagnosis not present

## 2014-05-07 MED ORDER — AMOXICILLIN-POT CLAVULANATE 875-125 MG PO TABS: 1.0000 | ORAL_TABLET | Freq: Two times a day (BID) | ORAL | Status: DC

## 2014-05-07 MED ORDER — METHYLPREDNISOLONE SODIUM SUCC 125 MG IJ SOLR
125.0000 mg | Freq: Once | INTRAMUSCULAR | Status: AC
  Administered 2014-05-07: 125 mg via INTRAVENOUS

## 2014-05-07 NOTE — Patient Instructions (Signed)

## 2014-05-09 ENCOUNTER — Encounter: Payer: Self-pay | Admitting: Physician Assistant

## 2014-05-09 NOTE — Progress Notes (Signed)
   Subjective:    Patient ID: Lacey Jensen, female    DOB: May 09, 1965, 49 y.o.   MRN: [id number]  HPI Pt presents to the clinic with 3 weeks of sinus pressure, ear pressure and thick green nasal discharge. Her teeth and face ache today. She has a hx of sinusitis. Last one was January of this year. She tries everything before she comes in. She knows they are allergy induced. She is on claritin D daily, flonase, nasal saline rinses. Years ago she did take shots for allergies. No fever, chills, ST, cough, wheezing or SOB.    Review of Systems  All other systems reviewed and are negative.      Objective:   Physical Exam  Constitutional: She is oriented to person, place, and time. She appears well-developed and well-nourished.  HENT:  Head: Normocephalic and atraumatic.  Right Ear: External ear normal.  Left Ear: External ear normal.  TM's clear.  Oropharynx erythematous with no tonsil enlargement or exudate.  Tenderness over frontal, temple, and maxillary sinuses.  Bilateral nasal turbinates red and swollen.   Eyes: Right eye exhibits no discharge. Left eye exhibits no discharge.  Slightly injected conjunctiva.  Neck: Normal range of motion. Neck supple.  Cardiovascular: Normal rate, regular rhythm and normal heart sounds.   Pulmonary/Chest: Effort normal and breath sounds normal. She has no wheezes.  Lymphadenopathy:    She has no cervical adenopathy.  Neurological: She is alert and oriented to person, place, and time.  Skin: Skin is dry.  Psychiatric: She has a normal mood and affect. Her behavior is normal.          Assessment & Plan:  Pansinuitis, acute- solumedrol 125mg  given in office today. augmentin for 10 days. Continue with allergy prevention. Discussed referral for allergist to get allergy testing again. Perhaps this would help with symptoms and recurrence of sinusitis. Pt will call if she wants referral.

## 2014-06-27 ENCOUNTER — Other Ambulatory Visit: Payer: Self-pay | Admitting: Family Medicine

## 2014-06-28 ENCOUNTER — Other Ambulatory Visit: Payer: Self-pay

## 2014-06-28 MED ORDER — VALACYCLOVIR HCL 500 MG PO TABS: 500.0000 mg | ORAL_TABLET | Freq: Two times a day (BID) | ORAL | Status: DC

## 2014-06-28 NOTE — Telephone Encounter (Signed)
Patient up to date on her annual exam. Patient is currently having herpes simplex outbreak. This is noted in her history. Kathrene Alu RN BSN 06-28-14

## 2014-07-29 ENCOUNTER — Other Ambulatory Visit: Payer: Self-pay | Admitting: Family Medicine

## 2014-08-02 ENCOUNTER — Other Ambulatory Visit: Payer: Self-pay

## 2014-08-02 ENCOUNTER — Telehealth: Payer: Self-pay | Admitting: Family Medicine

## 2014-08-02 MED ORDER — FLUOXETINE HCL 20 MG PO TABS: 20.0000 mg | ORAL_TABLET | Freq: Every day | ORAL | Status: DC

## 2014-08-02 MED ORDER — AMPHETAMINE-DEXTROAMPHETAMINE 15 MG PO TABS: 15.0000 mg | ORAL_TABLET | Freq: Every day | ORAL | Status: DC

## 2014-08-02 NOTE — Telephone Encounter (Signed)
Pt issue has already been addressed. Pt will be able to keep her appt on 08/17/14.

## 2014-08-02 NOTE — Telephone Encounter (Signed)
Pt called call center regarding her upcoming appt and refill request. Pt informed call center she would be going out of country and needs her refills before she leaves. Pt has an appt scheduled with PCP but will need to have one this week due to her travel plans.   Attempted to contact Pt, no answer. Left message to return call and we can add her onto Dr. Clovis Riley schedule this week. Callback information provided.

## 2014-08-17 ENCOUNTER — Ambulatory Visit: Payer: BC Managed Care – PPO | Admitting: Family Medicine

## 2014-08-25 ENCOUNTER — Encounter: Payer: Self-pay | Admitting: Family Medicine

## 2014-08-25 ENCOUNTER — Ambulatory Visit (INDEPENDENT_AMBULATORY_CARE_PROVIDER_SITE_OTHER): Payer: BC Managed Care – PPO | Admitting: Family Medicine

## 2014-08-25 VITALS — BP 102/66 | HR 64 | Ht 69.0 in | Wt 162.0 lb

## 2014-08-25 DIAGNOSIS — K130 Diseases of lips: Secondary | ICD-10-CM | POA: Diagnosis not present

## 2014-08-25 DIAGNOSIS — G43009 Migraine without aura, not intractable, without status migrainosus: Secondary | ICD-10-CM | POA: Diagnosis not present

## 2014-08-25 DIAGNOSIS — F902 Attention-deficit hyperactivity disorder, combined type: Secondary | ICD-10-CM

## 2014-08-25 DIAGNOSIS — Z114 Encounter for screening for human immunodeficiency virus [HIV]: Secondary | ICD-10-CM | POA: Diagnosis not present

## 2014-08-25 DIAGNOSIS — R220 Localized swelling, mass and lump, head: Secondary | ICD-10-CM

## 2014-08-25 MED ORDER — AMPHETAMINE-DEXTROAMPHETAMINE 15 MG PO TABS: 15.0000 mg | ORAL_TABLET | Freq: Every day | ORAL | Status: DC

## 2014-08-25 NOTE — Progress Notes (Signed)
   Subjective:    Patient ID: Lacey Jensen, female    DOB: March 16, 1965, 49 y.o.   MRN: [id number]  HPI Migraine with aura - I last saw her in January. The time her migraines are not well controlled we discussed putting her on propranolol prophylaxis. She retry Topamax in the past. She is only using the propranol at bedtime instead of BID.  Says her HA have been better.  Using Amerge for rescue.    ADHD - Currently on Adderall 15 mg daily. Tolerating it well without any side effects. No chest pain short of breath or palpitations. No insomnia.    Had some lip swelling while on vacation in  [place].  Says only seem to happen at the beach.  Mostly affected her lower lip. Did use some chapstick with spf.  She also eat a lot of seafood.  No tongue swelling.  She did show me a picture on her phone where the lower lip was significantly swollen with a little bit of cracking and scabs..   Review of Systems     Objective:   Physical Exam  Constitutional: She is oriented to person, place, and time. She appears well-developed and well-nourished.  HENT:  Head: Normocephalic and atraumatic.  Lips appear normal   Cardiovascular: Normal rate, regular rhythm and normal heart sounds.   Pulmonary/Chest: Effort normal and breath sounds normal.  Neurological: She is alert and oriented to person, place, and time.  Skin: Skin is warm and dry.  Psychiatric: She has a normal mood and affect. Her behavior is normal.          Assessment & Plan:  Migraine without aura - improved from last time. Amerge is working well.  Still getting about 2 HA per week. Did encourageher to increase the propranol BID.   ADHD- well controlled. BP at goal. F/U in 4 months.  Medication refilled for the next 90 days.  Lip swelling - resolved today . Buts suspect either contact dermatitis from chapstick versus allergy rection to seafood, etc.

## 2014-08-28 ENCOUNTER — Other Ambulatory Visit: Payer: Self-pay | Admitting: Family Medicine

## 2014-09-02 ENCOUNTER — Other Ambulatory Visit: Payer: Self-pay | Admitting: Family Medicine

## 2014-10-02 ENCOUNTER — Other Ambulatory Visit: Payer: Self-pay | Admitting: Family Medicine

## 2014-10-11 ENCOUNTER — Ambulatory Visit: Payer: BC Managed Care – PPO | Admitting: Family Medicine

## 2014-10-11 ENCOUNTER — Emergency Department (INDEPENDENT_AMBULATORY_CARE_PROVIDER_SITE_OTHER)
Admission: EM | Admit: 2014-10-11 | Discharge: 2014-10-11 | Disposition: A | Discharge status: Home / Self Care | Payer: BC Managed Care – PPO | Source: Home / Self Care | Attending: Family Medicine | Admitting: Family Medicine

## 2014-10-11 ENCOUNTER — Encounter: Payer: Self-pay | Admitting: Emergency Medicine

## 2014-10-11 ENCOUNTER — Emergency Department (INDEPENDENT_AMBULATORY_CARE_PROVIDER_SITE_OTHER): Payer: BC Managed Care – PPO

## 2014-10-11 DIAGNOSIS — M79671 Pain in right foot: Secondary | ICD-10-CM | POA: Diagnosis not present

## 2014-10-11 DIAGNOSIS — S92511A Displaced fracture of proximal phalanx of right lesser toe(s), initial encounter for closed fracture: Secondary | ICD-10-CM | POA: Diagnosis not present

## 2014-10-11 DIAGNOSIS — X58XXXA Exposure to other specified factors, initial encounter: Secondary | ICD-10-CM

## 2014-10-11 DIAGNOSIS — S92501A Displaced unspecified fracture of right lesser toe(s), initial encounter for closed fracture: Secondary | ICD-10-CM | POA: Diagnosis not present

## 2014-10-11 DIAGNOSIS — M79674 Pain in right toe(s): Secondary | ICD-10-CM | POA: Diagnosis not present

## 2014-10-11 DIAGNOSIS — J0101 Acute recurrent maxillary sinusitis: Secondary | ICD-10-CM | POA: Diagnosis not present

## 2014-10-11 MED ORDER — HYDROCODONE-ACETAMINOPHEN 5-325 MG PO TABS
1.0000 | ORAL_TABLET | Freq: Four times a day (QID) | ORAL | Status: DC | PRN
Start: 2014-10-11 — End: 2014-11-23

## 2014-10-11 MED ORDER — AMOXICILLIN-POT CLAVULANATE 875-125 MG PO TABS: 1.0000 | ORAL_TABLET | Freq: Two times a day (BID) | ORAL | Status: DC

## 2014-10-11 NOTE — ED Notes (Signed)
Sinus pain pressure x 1 week Rt foot injury x 3 days

## 2014-10-11 NOTE — ED Provider Notes (Signed)
CSN: [id number]     Arrival date & time 10/11/14  1116 History   First MD Initiated Contact with Patient 10/11/14 1132     Chief Complaint  Patient presents with  . Sinus Problem    Facial pain pressure, headache sneezing, productive cough x 1 week worse since Friday  . Foot Injury    # days ago ran into a small wooden stool Rt 3rd and 4th metatarsals and top of foot   (Consider location/radiation/quality/duration/timing/severity/associated sxs/prior Treatment) HPI  Pt is a 49yo female presenting to [facility] with 2 separate complaints.  Pt concerned for sinus infection and Right foot injury. Pt states she has recurrent sinus infections. Pt c/o gradually worsening nasal congestion, facial pain that is aching and sore, 9/10 at this time, post-nasal drip, sore throat and ear pain for 1 week. She has tried OTC medications w/o relief.  States she always gets a sinus infection around this time of year. Denies sick contacts or recent travel. Denies fever, chills, n/v/d.  Pt also c/o gradually worsening Right foot and toe pain, worse at 3rd and 4th toe.  Reports accidentally hitting it on a wood stool about 3 days ago.  Pain is aching and sore, 8/10 at worst, worse with palpation and ambulation. Mild to moderate relief with ibuprofen. She has also noticed mild bruising at area of pain. Denies numbness or tingling. Denies any other injuries.   Past Medical History  Diagnosis Date  . Immunotherapy   . Headache(784.0)   . Dysplasia of cervix     leep  . Abnormal Pap smear     Age 23 and age 76   Past Surgical History  Procedure Laterality Date  . A gyn surgery  2002  . Btl     Family History  Problem Relation Age of Onset  . Migraines Son    Social History  Substance Use Topics  . Smoking status: Former Smoker    Quit date: 01/23/2004  . Smokeless tobacco: Never Used  . Alcohol Use: 0.5 oz/week    1 drink(s) per week   OB History    Gravida Para Term Preterm AB TAB SAB Ectopic Multiple  Living   2 2        2      Review of Systems  Constitutional: Negative for fever and chills.  HENT: Positive for congestion, postnasal drip, rhinorrhea, sinus pressure, sneezing and sore throat ( "from postnasal drip"). Negative for ear pain, trouble swallowing and voice change.   Respiratory: Positive for cough. Negative for shortness of breath.   Cardiovascular: Negative for chest pain and palpitations.  Gastrointestinal: Negative for nausea, vomiting, abdominal pain and diarrhea.  Musculoskeletal: Positive for myalgias and arthralgias. Negative for back pain.       Right foot, Right 3rd toe  Skin: Positive for color change. Negative for rash and wound.       Right foot  Neurological: Positive for headaches. Negative for dizziness and light-headedness.  All other systems reviewed and are negative.   Allergies  Sumatriptan succinate and Zolmitriptan  Home Medications   Prior to Admission medications   Medication Sig Start Date End Date Taking? Authorizing Provider  amoxicillin-clavulanate (AUGMENTIN) 875-125 MG per tablet Take 1 tablet by mouth 2 (two) times daily. One po bid x 7 days 10/11/14   Noland Fordyce, PA-C  amphetamine-dextroamphetamine (ADDERALL) 15 MG tablet Take 1 tablet by mouth daily. OK to fill 11/02/14 08/25/14 08/25/15  Hali Marry, MD  FLUoxetine (PROZAC) 20 MG  tablet TAKE 1 TABLET BY MOUTH DAILY 10/04/14   Hali Marry, MD  fluticasone [facility]) 50 MCG/ACT nasal spray Place 2 sprays into the nose as needed. 06/20/12   Hali Marry, MD  HYDROcodone-acetaminophen (NORCO/VICODIN) 5-325 MG per tablet Take 1 tablet by mouth every 6 (six) hours as needed. 10/11/14   Noland Fordyce, PA-C  ibuprofen (ADVIL,MOTRIN) 600 MG tablet TAKE 1 TABLET BY MOUTH EVERY 6 HOURS AS NEEDED FOR PAIN 04/16/13   Hali Marry, MD  naratriptan (AMERGE) 1 MG TABS tablet TAKE 1 TABLET BY MOUTH AT ONSET OF HEADACHE; IF RETURNS OR DOES NOT RESOLVE, MAY REPEAT IN 4 HOURS. DONT  EXCEED 5TABS/24HOURS 08/30/14   Hali Marry, MD  propranolol (INDERAL) 40 MG tablet Take 1 tablet (40 mg total) by mouth 2 (two) times daily. 02/01/14   Hali Marry, MD  traZODone (DESYREL) 50 MG tablet Take 0.5-2 tablets (25-100 mg total) by mouth at bedtime as needed for sleep. 12/02/13   Hali Marry, MD  valACYclovir (VALTREX) 500 MG tablet Take 1 tablet (500 mg total) by mouth 2 (two) times daily. 06/28/14   Guss Bunde, MD   Meds Ordered and Administered this Visit  Medications - No data to display  BP 125/83 mmHg  Pulse 69  Temp(Src) 97.9 F (36.6 C) (Oral)  Ht 5\' 9"  (1.753 m)  Wt 150 lb (68.04 kg)  BMI 22.14 kg/m2  SpO2 99% No data found.   Physical Exam  Constitutional: She appears well-developed and well-nourished. No distress.  HENT:  Head: Normocephalic and atraumatic.  Right Ear: Hearing, tympanic membrane, external ear and ear canal normal.  Left Ear: Hearing, tympanic membrane, external ear and ear canal normal.  Nose: Mucosal edema present. Right sinus exhibits maxillary sinus tenderness and frontal sinus tenderness. Left sinus exhibits maxillary sinus tenderness. Left sinus exhibits no frontal sinus tenderness.  Mouth/Throat: Uvula is midline, oropharynx is clear and moist and mucous membranes are normal.  Eyes: Conjunctivae are normal. No scleral icterus.  Neck: Normal range of motion. Neck supple.  Cardiovascular: Normal rate, regular rhythm and normal heart sounds.   Right foot: cap refill < 3 seconds  Pulmonary/Chest: Effort normal and breath sounds normal. No respiratory distress. She has no wheezes. She has no rales. She exhibits no tenderness.  Abdominal: Soft. She exhibits no distension. There is no tenderness.  Musculoskeletal: She exhibits tenderness. She exhibits no edema.  Right foot: full ROM of Right ankle. No edema or deformity. Tenderness to base of 3rd and 4th toe. Limited flexion of 3rd toe due to pain.  Neurological: She  is alert.  Right foot: normal sensation  Skin: Skin is warm and dry. She is not diaphoretic.  Right foot: skin in tact, mild ecchymosis at base of 3rd and 4th toes  Nursing note and vitals reviewed.   ED Course  Procedures (including critical care time)   Splinting Right foot: 3rd and 4th toe strapped using buddy tapping technique. Post-op shoe applied.  Labs Review Labs Reviewed - No data to display  Imaging Review Dg Foot Complete Right  10/11/2014   CLINICAL DATA:  Pt hit right foot on wooden stool 3 days ago, c/o pain 2nd and 3rd toes  EXAM: RIGHT FOOT COMPLETE - 3+ VIEW  COMPARISON:  None.  FINDINGS: Oblique nondisplaced fracture third proximal phalanx. No other abnormalities.  IMPRESSION: Fracture third proximal phalanx   Electronically Signed   By: Skipper Cliche M.D.   On: 10/11/2014 12:09  MDM   1. Fracture of third toe, right, closed, initial encounter   2. Right foot pain   3. Toe pain, right   4. Acute recurrent maxillary sinusitis    Pt c/o sinus symptoms worsening for 1 week. Exam c/w sinusitis.  Rx: Augmentin. Encouraged to f/u with PCP in 10 days if not improving, sooner if worsening. Fluids, rest, and ibuprofen for pain and fever.  Right foot/toe pain: foot is neurovascularly in tact.  Skin in tact. No evidence of underlying infection. Plain films: fracture third proximal phalanx.   Right 3rd and 4th toes strapped using buddy taping technique, post-op shoe applied. Offered crutches, however, pt has a pair of her own in the car. Pt has to drive for work tomorrow, requested work note as fracture is on Right foot. Work note provided for 'no driving' today and tomorrow. Light duty and allow for rest/elevation for pain for 1 week. Discussed home RICE( rest, ice, compression, and elevation) Call to schedule f/u appointment with Dr. Georgina Snell, Sports Medicine, for recheck of symptoms in 1-2 weeks. Patient verbalized understanding and agreement with treatment  plan.     Noland Fordyce, PA-C 10/11/14 1232

## 2014-10-11 NOTE — Discharge Instructions (Signed)
Norco/vicodin (hydrocodone-acetaminophen) is a narcotic pain medication, do not combine these medications with others containing tylenol. While taking, do not drink alcohol, drive, or perform any other activities that requires focus while taking these medications.

## 2014-10-21 ENCOUNTER — Encounter: Payer: Self-pay | Admitting: Family Medicine

## 2014-10-21 ENCOUNTER — Ambulatory Visit (INDEPENDENT_AMBULATORY_CARE_PROVIDER_SITE_OTHER): Payer: BC Managed Care – PPO

## 2014-10-21 ENCOUNTER — Ambulatory Visit (INDEPENDENT_AMBULATORY_CARE_PROVIDER_SITE_OTHER): Payer: BC Managed Care – PPO | Admitting: Family Medicine

## 2014-10-21 VITALS — BP 123/80 | HR 100 | Wt 162.0 lb

## 2014-10-21 DIAGNOSIS — S92514D Nondisplaced fracture of proximal phalanx of right lesser toe(s), subsequent encounter for fracture with routine healing: Secondary | ICD-10-CM | POA: Diagnosis not present

## 2014-10-21 DIAGNOSIS — X58XXXA Exposure to other specified factors, initial encounter: Secondary | ICD-10-CM

## 2014-10-21 DIAGNOSIS — S92911A Unspecified fracture of right toe(s), initial encounter for closed fracture: Secondary | ICD-10-CM

## 2014-10-21 NOTE — Assessment & Plan Note (Signed)
Fractures involving toes 3 and 4 present. Fracture of the fourth toe not recognized initially. Plan to continue rigid soled shoe follow-up in 2 weeks.

## 2014-10-21 NOTE — Progress Notes (Signed)
   Subjective:    I'm seeing this patient as a consultation for:  Noland Fordyce PA-C  CC: Right toe fx  HPI: Patient was initially seen in September 19 and diagnosed with a fracture of her right third toe. She was treated with buddy tape and a rigid soled shoe. She presents to clinic today for follow-up. She notes the pain is improved but still present. She notes some swelling present intermittently. No radiating pain weakness or numbness fevers or chills. She takes over-the-counter medicines for pain which works well.  Past medical history, Surgical history, Family history not pertinant except as noted below, Social history, Allergies, and medications have been entered into the medical record, reviewed, and no changes needed.   Review of Systems: No headache, visual changes, nausea, vomiting, diarrhea, constipation, dizziness, abdominal pain, skin rash, fevers, chills, night sweats, weight loss, swollen lymph nodes, body aches, joint swelling, muscle aches, chest pain, shortness of breath, mood changes, visual or auditory hallucinations.   Objective:    Filed Vitals:   10/21/14 1421  BP: 123/80  Pulse: 100   General: Well Developed, well nourished, and in no acute distress.  Neuro/Psych: Alert and oriented x3, extra-ocular muscles intact, able to move all 4 extremities, sensation grossly intact. Skin: Warm and dry, no rashes noted.  Respiratory: Not using accessory muscles, speaking in full sentences, trachea midline.  Cardiovascular: Pulses palpable, no extremity edema. Abdomen: Does not appear distended. MSK: Right foot no swelling or ecchymosis present. Tender palpation proximal third and fourth toes. Pulses Capillary refill sensation intact distally.  Toe to 3 tape together to form buddy tape.  No results found for this or any previous visit (from the past 24 hour(s)). Dg Foot Complete Right  10/21/2014   CLINICAL DATA:  Follow-up right 3rd toe fracture  EXAM: RIGHT FOOT COMPLETE  - 3+ VIEW  COMPARISON:  10/11/2014  FINDINGS: Oblique fracture involving the mid the shaft of the 3rd proximal phalanx.  Subtle transverse fracture involving the proximal shaft of the 4th proximal phalanx.  Both are essentially unchanged from the prior study.  Associated soft tissue swelling.  The joint spaces are preserved.  IMPRESSION: Fractures involving the 3rd and 4th proximal phalanges, as above.   Electronically Signed   By: Julian Hy M.D.   On: 10/21/2014 14:48    Impression and Recommendations:   This case required medical decision making of moderate complexity.

## 2014-10-21 NOTE — Patient Instructions (Signed)
Thank you for coming in today. Continue buddy tape.  Use the rigid sole shoe as needed.  You can also purchase a feel steel insole insert to use in sneakers.  Return in 2 weeks.

## 2014-10-21 NOTE — Progress Notes (Signed)
Quick Note:  Fracture of third toe looks fine. We missed a very subtle fracture of the 4th toe as well. Continue the post op shoe or rigid insole. F/u in 2 weeks. ______

## 2014-11-01 ENCOUNTER — Other Ambulatory Visit: Payer: Self-pay | Admitting: Family Medicine

## 2014-11-23 ENCOUNTER — Encounter: Payer: Self-pay | Admitting: Physician Assistant

## 2014-11-23 ENCOUNTER — Ambulatory Visit (INDEPENDENT_AMBULATORY_CARE_PROVIDER_SITE_OTHER): Payer: BC Managed Care – PPO | Admitting: Obstetrics & Gynecology

## 2014-11-23 ENCOUNTER — Encounter: Payer: Self-pay | Admitting: Family Medicine

## 2014-11-23 ENCOUNTER — Ambulatory Visit (INDEPENDENT_AMBULATORY_CARE_PROVIDER_SITE_OTHER): Payer: BC Managed Care – PPO | Admitting: Family Medicine

## 2014-11-23 ENCOUNTER — Ambulatory Visit (INDEPENDENT_AMBULATORY_CARE_PROVIDER_SITE_OTHER): Payer: BC Managed Care – PPO | Admitting: Physician Assistant

## 2014-11-23 ENCOUNTER — Encounter: Payer: Self-pay | Admitting: Obstetrics & Gynecology

## 2014-11-23 VITALS — BP 135/81 | HR 101 | Resp 16 | Ht 69.0 in | Wt 159.0 lb

## 2014-11-23 VITALS — BP 135/81 | HR 98 | Resp 16 | Ht 69.0 in | Wt 159.0 lb

## 2014-11-23 VITALS — BP 132/63 | HR 100 | Temp 98.2°F | Resp 18 | Wt 163.4 lb

## 2014-11-23 DIAGNOSIS — M7551 Bursitis of right shoulder: Secondary | ICD-10-CM

## 2014-11-23 DIAGNOSIS — N920 Excessive and frequent menstruation with regular cycle: Secondary | ICD-10-CM | POA: Diagnosis not present

## 2014-11-23 DIAGNOSIS — G4700 Insomnia, unspecified: Secondary | ICD-10-CM | POA: Diagnosis not present

## 2014-11-23 DIAGNOSIS — R61 Generalized hyperhidrosis: Secondary | ICD-10-CM | POA: Diagnosis not present

## 2014-11-23 DIAGNOSIS — G43719 Chronic migraine without aura, intractable, without status migrainosus: Secondary | ICD-10-CM | POA: Diagnosis not present

## 2014-11-23 DIAGNOSIS — G43011 Migraine without aura, intractable, with status migrainosus: Secondary | ICD-10-CM

## 2014-11-23 MED ORDER — TOPIRAMATE 25 MG PO TABS: ORAL_TABLET | ORAL | Status: DC

## 2014-11-23 MED ORDER — CYCLOBENZAPRINE HCL 10 MG PO TABS: 10.0000 mg | ORAL_TABLET | Freq: Three times a day (TID) | ORAL | Status: DC | PRN

## 2014-11-23 MED ORDER — ZOLPIDEM TARTRATE ER 12.5 MG PO TBCR: 12.5000 mg | EXTENDED_RELEASE_TABLET | Freq: Every evening | ORAL | Status: DC | PRN

## 2014-11-23 MED ORDER — PROMETHAZINE HCL 25 MG PO TABS: 25.0000 mg | ORAL_TABLET | Freq: Four times a day (QID) | ORAL | Status: DC | PRN

## 2014-11-23 MED ORDER — FLUOXETINE HCL 20 MG PO TABS: 40.0000 mg | ORAL_TABLET | Freq: Every day | ORAL | Status: DC

## 2014-11-23 NOTE — Progress Notes (Signed)
Patient is a 49 year old female with complaints of headache, fatigue, heavy menstrual periods lasting 8 days that occur once a month, hot flashes, and problems sleeping.  Patient has been off oral contraceptives for several years. Her bleeding was better controlled and she was on them. She does not want to go back on birth control pills She feels like it makes her gain weight. She also has worse headaches associated with her menstrual cycle. Patient was wondering whether surgery was an option.  Patient is still taking Prozac for PMDD. This has helped her mood tremendously. She has not seen an improvement in hot flashes with the Prozac.  Pt has hot flashes at night and she wakes up drenched several times a night.  Patient had an endometrial biopsy and transvaginal ultrasound in 2012. The biopsy was benign. Patient had some fibroids. But no submucosal components nor polyps. See below:  Uterus: Is anteverted and retroflexed and demonstrates a sagittal length of 10.0 cm, an AP depth of 6.2 cm and a transverse width of 5.6 cm. A focal fibroid is identified in the anterior lower uterine segment with a partial subserosal component measuring 2.2 x 1.5 x 2.0 cm. A second small mural fibroid is identified in the anterior upper uterine segment measuring 7 x 6 x 5 mm. The remainder of the myometrium is homogeneous with no sonographic features suggestive of adenomyosis despite the slightly prominent uterine size.  Endometrium: Demonstrates a late tri- layered appearance with an AP width of 10.4 mm. No areas of focal thickening or heterogeneity are seen and this would correlate with a late periovulatory endometrial stripe and correspond the patient's given LMP of 09/11/2010  Right ovary: Measures 3.3 x 1.7 x 2.2 cm and contains a corpus luteum  Left ovary: Measures 3.0 by 1.4 x 0.8 cm and has a normal appearance  Other findings: No pelvic fluid or separate adnexal masses  are seen.  IMPRESSION: Focal fibroids with sizes and locations as noted above. Otherwise unremarkable late periovulatory pelvic ultrasound.   Patient has severe migraine headaches, greater than 20 a month. Patient associated nausea and vomiting. She's been on prophylactic treatment with propanolol without success. Patient also has extreme sleep disturbances that are not responding to melatonin and trazodone.  Enzo Bi is here today and is our headache specialist. She is able to see the patient today and our for her headaches. I will leave the headaches to her visit.  A/P 49 year old female with the following problems 1.  Menorrhagia 2.  Perimenopausal symptoms 3.  Migraine headaches 4.  Insomnia  Detailed workup for menorrhagia will include ultrasound and biopsy. We'll also check a CBC and TSH  For sleep patient is to stop melatonin and trazodone. She is given a prescription for Ambien CR. Sleep hygiene was reviewed in detail  We began to talk about hormonal therapy which could help with menorrhagia perimenopausal symptoms and migraine headaches. After workup is complete we will discuss final treatments.  25 minutes spent face-to-face with patient with greater than 50% counseling

## 2014-11-23 NOTE — Progress Notes (Signed)
Patient ID: Lacey Jensen, female   DOB: [birth year], 49 y.o.   MRN: [id number] History:  Lacey Jensen is a 49 y.o. G2P2 who presents to clinic today for new evaluation of headache.  It is moderate to severe and has been ongoing without any relief for weeks and maybe even months.  It is often located right frontal over her eyes but at times it goes across both sides onto her forehead and moving to the back of the head.  There is throbbing at times.  It builds and feels like a huge pressure.  Lights and noises worsen the HA along with movement.  She does feel nausea and has vomited with the HA about once a month.  No vision changes.   She started getting HA's in her 74s.  Her sister has HA's also.  She did not grow up with her bio parents.    She has tried [employer], massage of head.    She is using propanolol for prevention and uses naratriptan for treatment.  She eats lots of ibuprofen noting this helps her HA.  She has tried taking abx for sinus infection.  o  HIT6:65 Number of days in the last 4 weeks with:  Severe headache: 8 Moderate headache: 20  Mild headache: 0  No headache: 0   Past Medical History  Diagnosis Date  . Immunotherapy   . Headache(784.0)   . Dysplasia of cervix     leep  . Abnormal Pap smear     Age 66 and age 72    Social History   Social History  . Marital Status: Married    Spouse Name: Gwyndolyn Saxon  . Number of Children: 2  . Years of Education: N/A   Occupational History  . Mortgage broker    Social History Main Topics  . Smoking status: Former Smoker    Quit date: 01/23/2004  . Smokeless tobacco: Never Used  . Alcohol Use: 0.5 oz/week    1 drink(s) per week  . Drug Use: No  . Sexual Activity: Yes   Other Topics Concern  . Not on file   Social History Narrative   She was adopted so we do not have an extensive family history. She does yoga for exercise and drinks 2 caffeinated drinks per day.    Family History  Problem Relation Age of Onset  .  Migraines Son     Allergies  Allergen Reactions  . Sumatriptan Succinate     REACTION: Made her feel high  . Zolmitriptan     REACTION: Made her feel high    Current Outpatient Prescriptions on File Prior to Visit  Medication Sig Dispense Refill  . amphetamine-dextroamphetamine (ADDERALL) 15 MG tablet Take 1 tablet by mouth daily. OK to fill 11/02/14 30 tablet 0  . FLUoxetine (PROZAC) 20 MG tablet Take 2 tablets (40 mg total) by mouth daily. Will need a f/u around 11/16. Last appt for PMDD was11/15 60 tablet 0  . fluticasone (FLONASE) 50 MCG/ACT nasal spray Place 2 sprays into the nose as needed.    Marland Kitchen ibuprofen (ADVIL,MOTRIN) 600 MG tablet TAKE 1 TABLET BY MOUTH EVERY 6 HOURS AS NEEDED FOR PAIN 30 tablet 0  . naratriptan (AMERGE) 1 MG TABS tablet TAKE 1 TABLET BY MOUTH AT ONSET OF HEADACHE; IF RETURNS OR DOES NOT RESOLVE, MAY REPEAT IN 4 HOURS. DONT EXCEED 5TABS/24HOURS 9 tablet 0  . propranolol (INDERAL) 40 MG tablet Take 1 tablet (40 mg total) by mouth 2 (  two) times daily. 60 tablet 3  . valACYclovir (VALTREX) 500 MG tablet Take 1 tablet (500 mg total) by mouth 2 (two) times daily. 14 tablet 2  . zolpidem (AMBIEN CR) 12.5 MG CR tablet Take 1 tablet (12.5 mg total) by mouth at bedtime as needed for sleep. 30 tablet 0   No current facility-administered medications on file prior to visit.     Review of Systems:  All pertinent positive/negative included in HPI, all other review of systems are negative  Objective:  Physical Exam BP 135/81 mmHg  Pulse 98  Resp 16  Ht 5\' 9"  (1.753 m)  Wt 159 lb (72.122 kg)  BMI 23.47 kg/m2  LMP 11/09/2014 CONSTITUTIONAL: Well-developed, well-nourished female in no acute distress.  EYES: EOM intact ENT: Normocephalic CARDIOVASCULAR: Regular rate and rhythm with no adventitious sounds.  RESPIRATORY: Normal rate. Clear to auscultation bilaterally.  MUSCULOSKELETAL: Normal ROM, strength equal bilaterally, bilat trapezius and cervical paraspinal  muscle spasm noted SKIN: Warm, dry without erythema  NEUROLOGICAL: Alert, oriented, CN II-XII grossly intact PSYCH: Normal behavior, mood  PROCEDURE:  Nerve Blocks  Procedure: Mixture of 1%  Lidocaine, marcaine and dexamethazone in a ratio of 2:2:1  injected with 1cc each site in left greater Occipital Nerve, left lesser occipital nerves, left supraorbital nerve. Total amount injected: 3cc.  Pt tolerated procedure well and even noted improvement in pain prior to departure.  Assessment & Plan:  Assessment: 1. Intractable migraine without aura and with status migrainosus    Plan: Nerve blocks to abort present status migraine Begin Topamax for HA prevention.  Titrate to 75mg  as tolerated.   Flexeril 10mg  for muscle spasm/HA.  Sedation precautions Phenergan 25mg  for HA rescue if other meds not helpful.  Sedation precautions.   Follow-up in 1 months or sooner PRN  Paticia Stack, PA-C 11/23/2014 4:07 PM

## 2014-11-23 NOTE — Progress Notes (Signed)
   Subjective:    Patient ID: Lacey Jensen, female    DOB: [birth year], 49 y.o.   MRN: [id number]  HPI Patient comes in today complaining of right shoulder pain. It's bothering her when she lays on that side to sleep and also if she shifts cures in her car or if she's been running for long time if starts Lamisil like a tingling and numbness down the upper part of her arm. These are actually very similar to the symptoms that she had when I saw her back in January and we did a subacromial bursa injection at that time. She did really well up until about a week ago. She had been doing her stretches and exercises for bursitis. She denies any known injury or trauma but does have 3 very large dogs that she walks regularly which sometimes will pull her arm.No real alleviating factors.     Review of Systems     Objective:   Physical Exam  Constitutional: She appears well-nourished.  HENT:  Head: Normocephalic and atraumatic.  Musculoskeletal:  Shoulder joint itself is nontender. Right shoulder with normal range of motion. Slightly decreased internal rotation reaching to her low back compared to her left side. Strength is 5 out of 5 in all directions. 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  Skin: Skin is warm and dry.  Psychiatric: She has a normal mood and affect. Her behavior is normal.          Assessment & Plan:  Right shoulder pain-most consistent with subacromial bursitis-discussed options. She's very been doing home physical therapy without any relief. She would like to have a second injection since the last one lasted her almost 10 months. If symptoms recur she's not improving then recommend either further imaging with possible MRI or seeing one of our sports medicine providers. Patient tolerated the procedure well. Recommend that she ice it a couple times when she gets home this evening.  Shoulder Injection Procedure  Note  Pre-operative Diagnosis: right subacromial bursitis  Post-operative Diagnosis: Same   Indications: pain   Anesthesia: not required  Procedure Details   Verbal consent was obtained for the procedure. The shoulder was prepped with iodine and the skin was anesthetized. Using a 22 gauge needle the glenohumeral joint is injected with 9  mL 1% lidocaine and 1 mL of triamcinolone (KENALOG) 40mg /ml under the posterior aspect of the acromion. The injection site was cleansed with topical isopropyl alcohol and a dressing was applied.  Complications:  None; patient tolerated the procedure well.

## 2014-11-24 ENCOUNTER — Telehealth: Payer: Self-pay | Admitting: *Deleted

## 2014-11-24 LAB — CBC
HCT: 38.5 % (ref 36.0–46.0)
HEMOGLOBIN: 12.8 g/dL (ref 12.0–15.0)
MCH: 29.5 pg (ref 26.0–34.0)
MCHC: 33.2 g/dL (ref 30.0–36.0)
MCV: 88.7 fL (ref 78.0–100.0)
MPV: 10.6 fL (ref 8.6–12.4)
PLATELETS: 277 10*3/uL (ref 150–400)
RBC: 4.34 MIL/uL (ref 3.87–5.11)
RDW: 13.4 % (ref 11.5–15.5)
WBC: 6.3 10*3/uL (ref 4.0–10.5)

## 2014-11-24 LAB — TSH: TSH: 1.567 u[IU]/mL (ref 0.350–4.500)

## 2014-11-24 NOTE — Telephone Encounter (Signed)
LM on cell phone of normal TSH and CBC.

## 2014-12-06 ENCOUNTER — Encounter: Payer: Self-pay | Admitting: Obstetrics & Gynecology

## 2014-12-06 ENCOUNTER — Ambulatory Visit (INDEPENDENT_AMBULATORY_CARE_PROVIDER_SITE_OTHER): Payer: BC Managed Care – PPO | Admitting: Obstetrics & Gynecology

## 2014-12-06 ENCOUNTER — Other Ambulatory Visit (HOSPITAL_COMMUNITY)
Admission: RE | Admit: 2014-12-06 | Discharge: 2014-12-06 | Disposition: A | Discharge status: Home / Self Care | Payer: BC Managed Care – PPO | Source: Ambulatory Visit | Attending: Obstetrics & Gynecology | Admitting: Obstetrics & Gynecology

## 2014-12-06 VITALS — BP 135/83 | HR 101 | Resp 16 | Ht 69.0 in | Wt 159.0 lb

## 2014-12-06 DIAGNOSIS — N920 Excessive and frequent menstruation with regular cycle: Secondary | ICD-10-CM

## 2014-12-06 DIAGNOSIS — Z01812 Encounter for preprocedural laboratory examination: Secondary | ICD-10-CM | POA: Diagnosis not present

## 2014-12-06 LAB — POCT URINE PREGNANCY: Preg Test, Ur: NEGATIVE

## 2014-12-06 NOTE — Progress Notes (Signed)
ENDOMETRIAL BIOPSY     The indications for endometrial biopsy were reviewed.   Risks of the biopsy including cramping, bleeding, infection, uterine perforation, inadequate specimen and need for additional procedures  were discussed. The patient states she understands and agrees to undergo procedure today. Consent was signed. Time out was performed. Urine HCG was negative. A sterile speculum was placed in the patient's vagina and the cervix was prepped with Betadine. A single-toothed tenaculum was placed on the anterior lip of the cervix to stabilize it. The 3 mm pipelle was introduced into the endometrial cavity without difficulty to a depth of 8 cm, and a scant amount of tissue was obtained and sent to pathology. The instruments were removed from the patient's vagina. Minimal bleeding from the cervix was noted. The patient tolerated the procedure well. Routine post-procedure instructions were given to the patient. The patient will follow up to review the results and for further management.    Check FSH since such scant tissue obtained Patient still needs TVUS

## 2014-12-07 LAB — FOLLICLE STIMULATING HORMONE: FSH: 53.1 m[IU]/mL

## 2014-12-09 ENCOUNTER — Telehealth: Payer: Self-pay | Admitting: *Deleted

## 2014-12-09 NOTE — Telephone Encounter (Signed)
Called pt to adv biopsy normal - needs follow up appt

## 2014-12-09 NOTE — Telephone Encounter (Signed)
-----   Message from Guss Bunde, MD sent at 12/09/2014  5:32 AM EST ----- Biospy is benign.  RN to call pateint.

## 2014-12-13 ENCOUNTER — Ambulatory Visit (HOSPITAL_COMMUNITY): Payer: BC Managed Care – PPO

## 2015-01-02 ENCOUNTER — Other Ambulatory Visit: Payer: Self-pay | Admitting: Family Medicine

## 2015-01-05 ENCOUNTER — Other Ambulatory Visit: Payer: Self-pay | Admitting: *Deleted

## 2015-01-05 DIAGNOSIS — F3281 Premenstrual dysphoric disorder: Secondary | ICD-10-CM

## 2015-01-05 MED ORDER — FLUOXETINE HCL 20 MG PO TABS: 40.0000 mg | ORAL_TABLET | Freq: Every day | ORAL | Status: DC

## 2015-01-05 NOTE — Telephone Encounter (Signed)
Pt called concerning her Prozac.  She has run short since Dr Hulan Fray increased it to 40 mg daily.  RX sent to [facility] for 60 tabs  tp take 2 daily

## 2015-01-25 ENCOUNTER — Telehealth: Payer: Self-pay | Admitting: Physician Assistant

## 2015-01-25 MED ORDER — TOPIRAMATE 100 MG PO TABS: 100.0000 mg | ORAL_TABLET | Freq: Two times a day (BID) | ORAL | Status: DC

## 2015-01-25 MED ORDER — PREDNISONE 20 MG PO TABS: 80.0000 mg | ORAL_TABLET | Freq: Every day | ORAL | Status: DC

## 2015-01-25 NOTE — Telephone Encounter (Signed)
Pt called to say her headaches were somewhat improved until 01/17/15.  She has had a nonstop HA since that time.  It is presently 6/10 but is worse first thing in the morning.  She has used Amerge daily, Ibuprofen, Phenergan and Flexeril, all without lasting benefit.  She is able to sleep but notes she is also taking Ambien.  She is taking Topamax at 75mg  without complication.  Pt to take Prednisone taper.  I have discussed the directions thoroughly as well as what to expect.  Rx called in to pharmacy.  Additionally, she will increase Topamax to 100mg .  She is to make an appt for February to see me.

## 2015-02-02 ENCOUNTER — Ambulatory Visit (INDEPENDENT_AMBULATORY_CARE_PROVIDER_SITE_OTHER): Payer: BC Managed Care – PPO | Admitting: Physician Assistant

## 2015-02-02 ENCOUNTER — Encounter: Payer: Self-pay | Admitting: Physician Assistant

## 2015-02-02 VITALS — BP 105/68 | HR 93 | Temp 98.3°F | Ht 69.0 in | Wt 164.0 lb

## 2015-02-02 DIAGNOSIS — J0100 Acute maxillary sinusitis, unspecified: Secondary | ICD-10-CM | POA: Diagnosis not present

## 2015-02-02 MED ORDER — AMOXICILLIN-POT CLAVULANATE 875-125 MG PO TABS: 1.0000 | ORAL_TABLET | Freq: Two times a day (BID) | ORAL | Status: DC

## 2015-02-02 NOTE — Progress Notes (Signed)
   Subjective:    Patient ID: Lacey Jensen, female    DOB: [birth year], 50 y.o.   MRN: [id number]  HPI Patient is a 50 year old female who presents to the clinic with 6 days of sinus pressure, ear congestion, nonproductive cough. She denies any fever, chills, nausea or vomiting. She has had a lot of headache, sinus pressure and fatigue. She just feels worn out. Her cough is not productive. She denies any shortness of breath or wheezing. She has tried Mucinex D with little to no relief.   Review of Systems  All other systems reviewed and are negative.      Objective:   Physical Exam  Constitutional: She is oriented to person, place, and time. She appears well-developed and well-nourished.  HENT:  Head: Normocephalic and atraumatic.  Right Ear: External ear normal.  Left Ear: External ear normal.  TMs clear bilaterally.  Oropharynx erythematous with no tonsillar swelling or exudate.  Bilateral nares red and swollen.  Severe tenderness to palpation over maxillary sinuses.  Eyes: Conjunctivae are normal. Right eye exhibits no discharge. Left eye exhibits no discharge.  Neck: Normal range of motion. Neck supple.  Bilateral anterior cervical tender lymph node enlargement.  Cardiovascular: Normal rate, regular rhythm and normal heart sounds.   Pulmonary/Chest: Effort normal and breath sounds normal.  Neurological: She is alert and oriented to person, place, and time.  Psychiatric: She has a normal mood and affect. Her behavior is normal.          Assessment & Plan:  Acute maxillary sinusitis-treated with Augmentin for 10 days. Discuss other symptomatic care. Handout given. Follow-up if not improving by Friday. Consider Flonase that patient has on medication list.

## 2015-02-02 NOTE — Patient Instructions (Signed)

## 2015-02-03 ENCOUNTER — Telehealth: Payer: Self-pay | Admitting: *Deleted

## 2015-02-03 ENCOUNTER — Encounter: Payer: Self-pay | Admitting: *Deleted

## 2015-02-03 DIAGNOSIS — G4700 Insomnia, unspecified: Secondary | ICD-10-CM

## 2015-02-03 MED ORDER — ZOLPIDEM TARTRATE ER 12.5 MG PO TBCR: 12.5000 mg | EXTENDED_RELEASE_TABLET | Freq: Every evening | ORAL | Status: DC | PRN

## 2015-02-03 NOTE — Telephone Encounter (Signed)
-----   Message from Guss Bunde, MD sent at 02/03/2015  5:41 AM EST ----- Regarding: RE: Ambien RF yes ----- Message -----    From: Asencion Islam, RN    Sent: 02/02/2015   2:26 PM      To: Guss Bunde, MD Subject: Ambien RF                                      Hey, Jailee (Kristin Bruins Benton's mother) requesting a RF on Ambien you gave her in November.   Yeah or Nay?

## 2015-02-03 NOTE — Telephone Encounter (Signed)
RF for Ambien CR 12.5mg  was called into [facility] in [place] per Dr Gala Romney

## 2015-02-18 ENCOUNTER — Telehealth: Payer: Self-pay

## 2015-02-18 NOTE — Telephone Encounter (Signed)
Patient left a message stating still sick with a productive cough with green sputum. She would like another antibiotic. Please advise.

## 2015-03-01 ENCOUNTER — Ambulatory Visit (INDEPENDENT_AMBULATORY_CARE_PROVIDER_SITE_OTHER): Payer: BC Managed Care – PPO | Admitting: Physician Assistant

## 2015-03-01 ENCOUNTER — Encounter: Payer: Self-pay | Admitting: Physician Assistant

## 2015-03-01 VITALS — BP 122/76 | HR 89 | Resp 16 | Ht 69.0 in | Wt 164.0 lb

## 2015-03-01 DIAGNOSIS — M62838 Other muscle spasm: Secondary | ICD-10-CM | POA: Diagnosis not present

## 2015-03-01 DIAGNOSIS — G43011 Migraine without aura, intractable, with status migrainosus: Secondary | ICD-10-CM

## 2015-03-01 MED ORDER — CYCLOBENZAPRINE HCL 10 MG PO TABS: 10.0000 mg | ORAL_TABLET | Freq: Three times a day (TID) | ORAL | Status: DC | PRN

## 2015-03-01 MED ORDER — MELOXICAM 7.5 MG PO TABS: 7.5000 mg | ORAL_TABLET | Freq: Two times a day (BID) | ORAL | Status: DC

## 2015-03-01 MED ORDER — ELETRIPTAN HYDROBROMIDE 40 MG PO TABS: 40.0000 mg | ORAL_TABLET | ORAL | Status: DC | PRN

## 2015-03-01 MED ORDER — TOPIRAMATE 100 MG PO TABS: 100.0000 mg | ORAL_TABLET | Freq: Two times a day (BID) | ORAL | Status: DC

## 2015-03-01 NOTE — Patient Instructions (Signed)

## 2015-03-01 NOTE — Progress Notes (Signed)
Patient ID: Lacey Jensen, female   DOB: 1965/03/29, 50 y.o.   MRN: [id number] History:  HONG VOGLER is a 50 y.o. G2P2 who presents to clinic today for followup of headaches.  She increased Topamax to 100mg  last month as well as using a prednisone taper for migraine status.  The prednisone got her out of that cycle but then the headaches got worse again.  She has 2-3 bad headaches per week.  Sometimes they don't improve in the morning.  The Lorrin Mais does help with the worst headaches.     HIT6:64 Number of days in the last 4 weeks with:  Severe headache: 8 Moderate headache: 10 Mild headache: 4  No headache: 6   Past Medical History  Diagnosis Date  . Immunotherapy   . Headache(784.0)   . Dysplasia of cervix     leep  . Abnormal Pap smear     Age 82 and age 89    Social History   Social History  . Marital Status: Married    Spouse Name: Gwyndolyn Saxon  . Number of Children: 2  . Years of Education: N/A   Occupational History  . Mortgage broker    Social History Main Topics  . Smoking status: Former Smoker    Quit date: 01/23/2004  . Smokeless tobacco: Never Used  . Alcohol Use: 0.5 oz/week    1 drink(s) per week  . Drug Use: No  . Sexual Activity: Yes   Other Topics Concern  . Not on file   Social History Narrative   She was adopted so we do not have an extensive family history. She does yoga for exercise and drinks 2 caffeinated drinks per day.    Family History  Problem Relation Age of Onset  . Migraines Son     Allergies  Allergen Reactions  . Sumatriptan Succinate     REACTION: Made her feel high  . Zolmitriptan     REACTION: Made her feel high    Current Outpatient Prescriptions on File Prior to Visit  Medication Sig Dispense Refill  . amoxicillin-clavulanate (AUGMENTIN) 875-125 MG tablet Take 1 tablet by mouth 2 (two) times daily. 20 tablet 0  . amphetamine-dextroamphetamine (ADDERALL) 15 MG tablet Take 1 tablet by mouth daily. OK to fill 11/02/14 30  tablet 0  . cyclobenzaprine (FLEXERIL) 10 MG tablet Take 1 tablet (10 mg total) by mouth every 8 (eight) hours as needed for muscle spasms. 30 tablet 1  . FLUoxetine (PROZAC) 20 MG tablet Take 2 tablets (40 mg total) by mouth daily. Will need a f/u around 11/16. Last appt for PMDD was11/15 60 tablet 1  . fluticasone (FLONASE) 50 MCG/ACT nasal spray Place 2 sprays into the nose as needed.    Marland Kitchen ibuprofen (ADVIL,MOTRIN) 600 MG tablet TAKE 1 TABLET BY MOUTH EVERY 6 HOURS AS NEEDED FOR PAIN 30 tablet 0  . naratriptan (AMERGE) 1 MG TABS tablet TAKE 1 TABLET BY MOUTH AT ONSET OF HEADACHE. MAY REPEAT IN 4 HOURS IF NO RELIEF. DO NOT EXCEED 5 TABLET IN 24 HOURS 27 tablet 0  . predniSONE (DELTASONE) 20 MG tablet Take 4 tablets (80 mg total) by mouth daily with breakfast. On day 2: Take 3 tabs (60mg ).  On day 3: Take 2 tabs (40mg ).  On day 4: take 1 tab. 10 tablet 0  . promethazine (PHENERGAN) 25 MG tablet Take 1 tablet (25 mg total) by mouth every 6 (six) hours as needed for nausea or vomiting. 30 tablet 1  .  propranolol (INDERAL) 40 MG tablet Take 1 tablet (40 mg total) by mouth 2 (two) times daily. 60 tablet 3  . topiramate (TOPAMAX) 100 MG tablet Take 1 tablet (100 mg total) by mouth 2 (two) times daily. 30 tablet 1  . valACYclovir (VALTREX) 500 MG tablet Take 1 tablet (500 mg total) by mouth 2 (two) times daily. 14 tablet 2  . zolpidem (AMBIEN CR) 12.5 MG CR tablet Take 1 tablet (12.5 mg total) by mouth at bedtime as needed for sleep. 30 tablet 0   No current facility-administered medications on file prior to visit.     Review of Systems:  All pertinent positive/negative included in HPI, all other review of systems are negative  Objective:  Physical Exam There were no vitals taken for this visit. CONSTITUTIONAL: Well-developed, well-nourished female in no acute distress.  EYES: EOM intact ENT: Normocephalic CARDIOVASCULAR: Regular rate and rhythm with no adventitious sounds. RESPIRATORY: Normal  rate. Clear to auscultation bilaterally.  ENDOCRINE: Normal thyroid.  MUSCULOSKELETAL: Normal ROM, strength equal bilaterally, significant muscle spasm noted in  Neck and shoulders bilat SKIN: Warm, dry without erythema  NEUROLOGICAL: Alert, oriented, CN II-XII grossly intact, Appropriate balance PSYCH: Normal behavior, mood   Assessment & Plan:  Assessment: 1. Intractable migraine without aura and with status migrainosus   2.  Muscle Spasm    Plan: Add Flexeril for Migraine and muscle spasm.  Okay to use 1/2 tablet Add Mobic for anti inflammatory.  Okay to use with Triptan Will trial new triptan: Relpax.  Directions discussed.   Continue Topamax and Propanolol for Prevention of migraine  Follow-up in 2-3 months or sooner PRN  Paticia Stack, PA-C 03/01/2015 2:45 PM

## 2015-03-09 ENCOUNTER — Other Ambulatory Visit: Payer: Self-pay | Admitting: Obstetrics & Gynecology

## 2015-03-09 DIAGNOSIS — G43019 Migraine without aura, intractable, without status migrainosus: Secondary | ICD-10-CM

## 2015-03-10 ENCOUNTER — Other Ambulatory Visit: Payer: Self-pay | Admitting: *Deleted

## 2015-03-10 DIAGNOSIS — F3281 Premenstrual dysphoric disorder: Secondary | ICD-10-CM

## 2015-03-10 MED ORDER — FLUOXETINE HCL 20 MG PO TABS: 40.0000 mg | ORAL_TABLET | Freq: Every day | ORAL | Status: DC

## 2015-03-10 NOTE — Telephone Encounter (Signed)
RF authorization for Prozac given per Dr Gala Romney.

## 2015-03-27 ENCOUNTER — Other Ambulatory Visit: Payer: Self-pay | Admitting: Physician Assistant

## 2015-03-30 ENCOUNTER — Other Ambulatory Visit: Payer: Self-pay | Admitting: *Deleted

## 2015-03-30 NOTE — Telephone Encounter (Signed)
Received a fax from [facility] for a refill for [facility] for toprimate 100mg  tabs. Patient is a Engineer, maintenance patient - will route to Sherryle Lis, Therapist, sports at [place] and to [employer].

## 2015-03-31 ENCOUNTER — Other Ambulatory Visit: Payer: Self-pay | Admitting: *Deleted

## 2015-03-31 DIAGNOSIS — G43719 Chronic migraine without aura, intractable, without status migrainosus: Secondary | ICD-10-CM

## 2015-03-31 MED ORDER — TOPIRAMATE 100 MG PO TABS: 100.0000 mg | ORAL_TABLET | Freq: Two times a day (BID) | ORAL | Status: DC

## 2015-03-31 NOTE — Telephone Encounter (Signed)
RF authorization for Topamax sent to [facility]

## 2015-04-13 ENCOUNTER — Telehealth: Payer: Self-pay | Admitting: *Deleted

## 2015-04-13 MED ORDER — MELOXICAM 7.5 MG PO TABS: 7.5000 mg | ORAL_TABLET | Freq: Two times a day (BID) | ORAL | Status: DC

## 2015-04-13 NOTE — Telephone Encounter (Signed)
error 

## 2015-04-13 NOTE — Telephone Encounter (Signed)
Refilled Mobic °

## 2015-06-17 ENCOUNTER — Telehealth: Payer: Self-pay | Admitting: Family Medicine

## 2015-06-17 DIAGNOSIS — F3281 Premenstrual dysphoric disorder: Secondary | ICD-10-CM

## 2015-06-17 MED ORDER — FLUOXETINE HCL 20 MG PO TABS: 40.0000 mg | ORAL_TABLET | Freq: Every day | ORAL | Status: DC

## 2015-06-17 NOTE — Telephone Encounter (Signed)
error 

## 2015-06-17 NOTE — Addendum Note (Signed)
Addended by: Huel Cote on: 06/17/2015 02:45 PM   Modules accepted: Orders

## 2015-06-17 NOTE — Telephone Encounter (Signed)
Rx refilled. Pt notified.  

## 2015-06-17 NOTE — Telephone Encounter (Signed)
Pt called to ask if PCP will write new Rx for Fluoxetine. Last Rx was written by GYN, that Provider is out of office. Pt is scheduled for an appt next week with PCP and doesn't want to run out before then. Will route.

## 2015-06-17 NOTE — Telephone Encounter (Signed)
Ok to fill for one month 

## 2015-06-22 ENCOUNTER — Ambulatory Visit: Payer: BC Managed Care – PPO | Admitting: Family Medicine

## 2015-06-30 ENCOUNTER — Ambulatory Visit (INDEPENDENT_AMBULATORY_CARE_PROVIDER_SITE_OTHER): Payer: BC Managed Care – PPO | Admitting: Family Medicine

## 2015-06-30 ENCOUNTER — Encounter: Payer: Self-pay | Admitting: Family Medicine

## 2015-06-30 VITALS — BP 115/73 | HR 85 | Wt 164.0 lb

## 2015-06-30 DIAGNOSIS — F3281 Premenstrual dysphoric disorder: Secondary | ICD-10-CM | POA: Diagnosis not present

## 2015-06-30 DIAGNOSIS — G43009 Migraine without aura, not intractable, without status migrainosus: Secondary | ICD-10-CM | POA: Diagnosis not present

## 2015-06-30 DIAGNOSIS — L918 Other hypertrophic disorders of the skin: Secondary | ICD-10-CM

## 2015-06-30 NOTE — Progress Notes (Signed)
Subjective:    CC:   HPI:  Migraine headaches-overall she is doing very well on her current regimen. Unfortunately, her headache specialist is no longer coming to our location. She is currently on Topamax and uses Relpax for her rescue. She is requesting a refill on her Relpax.  He also has several moles that she would like me to look at and would like to have them removed today.  Patient has premenstrual dysphoric disorder and currently takes fluoxetine 20 mg to help control mood and symptoms. She is happy with current regimen and is requesting refills today.  Past medical history, Surgical history, Family history not pertinant except as noted below, Social history, Allergies, and medications have been entered into the medical record, reviewed, and corrections made.   Review of Systems: No fevers, chills, night sweats, weight loss, chest pain, or shortness of breath.   Objective:    General: Well Developed, well nourished, and in no acute distress.  Neuro: Alert and oriented x3, extra-ocular muscles intact, sensation grossly intact.  HEENT: Normocephalic, atraumatic  Skin: Warm and dry, no rashes. Cardiac: Regular rate and rhythm, no murmurs rubs or gallops, no lower extremity edema.  Respiratory: Clear to auscultation bilaterally. Not using accessory muscles, speaking in full sentences.   Impression and Recommendations:   Migraine headache-well controlled on current regimen. Will refill her Relpax today. F/u in 6 months.   Skin tags-4 removed. Patient tolerated well. 3 sent for pathology. Will call with results once available.  PMDD - refilled fluoxetine.   Subjective:     MACIE GOODINE is a 50 y.o. female who complains of skin tags. The patient wishes skin tag removed as the lesion is getting caught on clothing and/or jewelry and is recurrently irritated. She has one near the right clavicle and 1 closer to the neck on the left side, one on her left upper abdomen over the ribs  and one on her left inner arm.  The following portions of the patient's history were reviewed and updated as appropriate: allergies, current medications, past family history, past medical history, past social history, past surgical history and problem list.  Review of Systems A comprehensive review of systems was negative.    Objective:    Skin:  4 skin tags in the upper chest, leftr upper inner arm and left upper abdomen. The skin was numbed with lidocaine with 1% epinephrine. The skin tags were removed using a Dermablade and forceps after Hibiclens prep; hemostasis is obtained with aluminum chloride and discarded.    Assessment:    Chronically irritated skin tag, removed.    Plan:     1. The patient is instructed to watch for signs of infection including erythema, pain,      purulent discharge, or crusting.  2. Verbal patient instruction given. 3. Follow up as needed for acute illness.

## 2015-06-30 NOTE — Addendum Note (Signed)
Addended by: Teddy Spike on: 06/30/2015 01:54 PM   Modules accepted: Orders

## 2015-07-20 ENCOUNTER — Telehealth: Payer: Self-pay

## 2015-07-20 ENCOUNTER — Other Ambulatory Visit: Payer: Self-pay | Admitting: Physician Assistant

## 2015-07-20 MED ORDER — ELETRIPTAN HYDROBROMIDE 40 MG PO TABS: 40.0000 mg | ORAL_TABLET | ORAL | Status: DC | PRN

## 2015-07-20 NOTE — Telephone Encounter (Signed)
Script filled.

## 2015-07-23 ENCOUNTER — Other Ambulatory Visit: Payer: Self-pay | Admitting: Family Medicine

## 2015-07-25 ENCOUNTER — Other Ambulatory Visit: Payer: Self-pay | Admitting: *Deleted

## 2015-07-25 MED ORDER — TOPIRAMATE 25 MG PO TABS: ORAL_TABLET | ORAL | Status: DC

## 2015-08-02 ENCOUNTER — Telehealth: Payer: Self-pay | Admitting: *Deleted

## 2015-08-02 DIAGNOSIS — G43009 Migraine without aura, not intractable, without status migrainosus: Secondary | ICD-10-CM

## 2015-08-02 DIAGNOSIS — G43109 Migraine with aura, not intractable, without status migrainosus: Secondary | ICD-10-CM

## 2015-08-02 MED ORDER — PREDNISONE 20 MG PO TABS: ORAL_TABLET | ORAL | Status: DC

## 2015-08-02 NOTE — Telephone Encounter (Signed)
Pt called requesting the Prednisone taper, has appt scheduled for follow-up with Allie Dimmer, sent Prednisone to pharmacy per Santiago Glad order.

## 2015-08-02 NOTE — Telephone Encounter (Signed)
-----   Message from Francia Greaves sent at 08/01/2015  3:16 PM EDT ----- Regarding: Rx Request Contact: [phone] Wants a Rx for prednisone Uses [facility] [address] in [place]

## 2015-08-08 ENCOUNTER — Encounter: Payer: Self-pay | Admitting: Family Medicine

## 2015-08-08 ENCOUNTER — Ambulatory Visit (INDEPENDENT_AMBULATORY_CARE_PROVIDER_SITE_OTHER): Payer: BC Managed Care – PPO | Admitting: Family Medicine

## 2015-08-08 VITALS — BP 125/83 | HR 91 | Temp 98.2°F | Wt 164.0 lb

## 2015-08-08 DIAGNOSIS — B9689 Other specified bacterial agents as the cause of diseases classified elsewhere: Secondary | ICD-10-CM

## 2015-08-08 DIAGNOSIS — J329 Chronic sinusitis, unspecified: Secondary | ICD-10-CM

## 2015-08-08 DIAGNOSIS — A499 Bacterial infection, unspecified: Secondary | ICD-10-CM

## 2015-08-08 DIAGNOSIS — G43011 Migraine without aura, intractable, with status migrainosus: Secondary | ICD-10-CM | POA: Diagnosis not present

## 2015-08-08 MED ORDER — AMOXICILLIN-POT CLAVULANATE 500-125 MG PO TABS: ORAL_TABLET | ORAL | Status: AC

## 2015-08-08 MED ORDER — CYCLOBENZAPRINE HCL 10 MG PO TABS: 10.0000 mg | ORAL_TABLET | Freq: Three times a day (TID) | ORAL | Status: DC | PRN

## 2015-08-08 NOTE — Progress Notes (Signed)
CC: Lacey Jensen is a 50 y.o. female is here for Sinusitis   Subjective: HPI:  For the past 10 days she's been experiencing left-sided facial pain and left cheek and the left forehead. She took a prednisone taper with a maximum dose of 80 mg of prednisone late last week which provided no relief to her pain. It's a dull persistent pain nothing seems to make it better or worse. She's tried cyclobenzaprine, which she is out of and requesting refills of, cold compresses and ibuprofen nothing seems to help. She denies any motor or sensory disturbance or ocular pain. She tells me this feels different than her usual migraine. She denies any cough, wheezing, shortness of breath or nasal congestion.   Review Of Systems Outlined In HPI  Past Medical History  Diagnosis Date  . Immunotherapy   . Headache(784.0)   . Dysplasia of cervix     leep  . Abnormal Pap smear     Age 46 and age 2    Past Surgical History  Procedure Laterality Date  . A gyn surgery  2002  . Btl     Family History  Problem Relation Age of Onset  . Migraines Son     Social History   Social History  . Marital Status: Married    Spouse Name: Gwyndolyn Saxon  . Number of Children: 2  . Years of Education: N/A   Occupational History  . Mortgage broker    Social History Main Topics  . Smoking status: Former Smoker    Quit date: 01/23/2004  . Smokeless tobacco: Never Used  . Alcohol Use: 0.5 oz/week    1 drink(s) per week  . Drug Use: No  . Sexual Activity: Yes   Other Topics Concern  . Not on file   Social History Narrative   She was adopted so we do not have an extensive family history. She does yoga for exercise and drinks 2 caffeinated drinks per day.     Objective: BP 125/83 mmHg  Pulse 91  Temp(Src) 98.2 F (36.8 C) (Oral)  Wt 164 lb (74.39 kg)  General: Alert and Oriented, No Acute Distress HEENT: Pupils equal, round, reactive to light. Conjunctivae clear.  External ears unremarkable, canals clear  with intact TMs with appropriate landmarks.  Middle ear appears open without effusion. Pink inferior turbinates.  Moist mucous membranes, pharynx without inflammation nor lesionsOther than postnasal drip.  Neck supple without palpable lymphadenopathy nor abnormal masses. Cranial nerves II through XII grossly intact Lungs: Clear to auscultation bilaterally, no wheezing/ronchi/rales.  Comfortable work of breathing. Good air movement. Cardiac: Regular rate and rhythm. Normal S1/S2.  No murmurs, rubs, nor gallops.   Abdomen: Normal bowel sounds, soft and non tender without palpable masses. Extremities: No peripheral edema.  Strong peripheral pulses.  Mental Status: No depression, anxiety, nor agitation. Skin: Warm and dry.  Assessment & Plan: Lacey Jensen was seen today for sinusitis.  Diagnoses and all orders for this visit:  Intractable migraine without aura and with status migrainosus -     cyclobenzaprine (FLEXERIL) 10 MG tablet; Take 1 tablet (10 mg total) by mouth every 8 (eight) hours as needed for muscle spasms.  Bacterial sinusitis  Other orders -     amoxicillin-clavulanate (AUGMENTIN) 500-125 MG tablet; Take one by mouth every 8 hours for ten total days.   Refill on cyclobenzaprine per patient request. Suspect a bacterial sinusitis can be causing her discomfort therefore start Augmentin and nasal saline washes.Signs and symptoms requring emergent/urgent reevaluation  were discussed with the patient.  No Follow-up on file.

## 2015-08-23 ENCOUNTER — Other Ambulatory Visit: Payer: Self-pay | Admitting: Family Medicine

## 2015-09-23 ENCOUNTER — Encounter: Payer: BC Managed Care – PPO | Admitting: Physician Assistant

## 2015-09-23 DIAGNOSIS — R51 Headache: Secondary | ICD-10-CM

## 2015-09-29 ENCOUNTER — Ambulatory Visit (INDEPENDENT_AMBULATORY_CARE_PROVIDER_SITE_OTHER): Payer: BC Managed Care – PPO | Admitting: Family Medicine

## 2015-09-29 ENCOUNTER — Encounter: Payer: Self-pay | Admitting: Family Medicine

## 2015-09-29 VITALS — BP 115/68 | HR 74 | Wt 168.0 lb

## 2015-09-29 DIAGNOSIS — J3489 Other specified disorders of nose and nasal sinuses: Secondary | ICD-10-CM | POA: Diagnosis not present

## 2015-09-29 DIAGNOSIS — J340 Abscess, furuncle and carbuncle of nose: Secondary | ICD-10-CM

## 2015-09-29 MED ORDER — MUPIROCIN 2 % EX OINT: TOPICAL_OINTMENT | CUTANEOUS | 0 refills | Status: DC

## 2015-09-29 NOTE — Progress Notes (Signed)
Subjective:    CC: Nasal Ulcer  HPI:  Patient comes in today complaining of a nasal ulcer in the right nostril 2 weeks. She says it continues to bleed but she has been root removing the scab daily. The last couple days she even tried using peroxide to see if that would help it heal. She denies any trauma or injury. Says it's very sore. She has been running a he will notify her and she did stop her Flonase.  Past medical history, Surgical history, Family history not pertinant except as noted below, Social history, Allergies, and medications have been entered into the medical record, reviewed, and corrections made.   Review of Systems: No fevers, chills, night sweats, weight loss, chest pain, or shortness of breath.   Objective:    General: Well Developed, well nourished, and in no acute distress.  Neuro: Alert and oriented x3, extra-ocular muscles intact, sensation grossly intact.  HEENT: Normocephalic, atraumatic. Some irritation on the lateral wall on the right nares.  Left nostril is normal.     Impression and Recommendations:    Nasal ulcer-will treat with Bactroban ointment applied daily. Avoid picking at the nose or blowing too hard. Explained that keeping the scab intact and actually help it heal. Continue wearing the generic a fire and continue to avoid the Flonase until it has completely healed. If not better in 2 weeks then recommend come in for chemical cautery for definitive treatment.

## 2016-01-22 ENCOUNTER — Other Ambulatory Visit: Payer: Self-pay | Admitting: Family Medicine

## 2016-02-13 ENCOUNTER — Emergency Department
Admission: EM | Admit: 2016-02-13 | Discharge: 2016-02-13 | Disposition: A | Discharge status: Home / Self Care | Payer: BC Managed Care – PPO | Source: Home / Self Care | Attending: Family Medicine | Admitting: Family Medicine

## 2016-02-13 ENCOUNTER — Encounter: Payer: Self-pay | Admitting: *Deleted

## 2016-02-13 DIAGNOSIS — J019 Acute sinusitis, unspecified: Secondary | ICD-10-CM

## 2016-02-13 MED ORDER — PREDNISONE 20 MG PO TABS: ORAL_TABLET | ORAL | 0 refills | Status: DC

## 2016-02-13 MED ORDER — AMOXICILLIN-POT CLAVULANATE 875-125 MG PO TABS: 1.0000 | ORAL_TABLET | Freq: Two times a day (BID) | ORAL | 0 refills | Status: DC

## 2016-02-13 NOTE — ED Provider Notes (Signed)
CSN: [id number]     Arrival date & time 02/13/16  1258 History   First MD Initiated Contact with Patient 02/13/16 1325     Chief Complaint  Patient presents with  . Headache  . Sinus Problem   (Consider location/radiation/quality/duration/timing/severity/associated sxs/prior Treatment) HPI  Lacey Jensen is a 51 y.o. female presenting to UC with c/o 1 week of frontal headache, with 4 days of sinus congestion pain and pressure.  Pain is worse on Left side.  He has taken Mucinex D and ibuprofen with minimal relief.  Denies fever, chills, n/v/d. Hx of recurrent sinus infections.   Past Medical History:  Diagnosis Date  . Abnormal Pap smear    Age 74 and age 76  . Dysplasia of cervix    leep  . Headache(784.0)   . Immunotherapy    Past Surgical History:  Procedure Laterality Date  . A GYN surgery  2002  . btl     Family History  Problem Relation Age of Onset  . Migraines Son    Social History  Substance Use Topics  . Smoking status: Former Smoker    Quit date: 01/23/2004  . Smokeless tobacco: Never Used  . Alcohol use 0.5 oz/week    1 drink(s) per week   OB History    Gravida Para Term Preterm AB Living   2 2       2    SAB TAB Ectopic Multiple Live Births                 Review of Systems  Constitutional: Negative for chills and fever.  HENT: Positive for congestion, sinus pain and sinus pressure. Negative for ear pain, sore throat, trouble swallowing and voice change.   Respiratory: Negative for cough and shortness of breath.   Cardiovascular: Negative for chest pain and palpitations.  Gastrointestinal: Negative for abdominal pain, diarrhea, nausea and vomiting.  Musculoskeletal: Negative for arthralgias, back pain and myalgias.  Skin: Negative for rash.  Neurological: Positive for headaches. Negative for dizziness and light-headedness.    Allergies  Sumatriptan succinate and Zolmitriptan  Home Medications   Prior to Admission medications   Medication Sig  Start Date End Date Taking? Authorizing Provider  amoxicillin-clavulanate (AUGMENTIN) 875-125 MG tablet Take 1 tablet by mouth 2 (two) times daily. One po bid x 7 days 02/13/16   Noland Fordyce, PA-C  cyclobenzaprine (FLEXERIL) 10 MG tablet Take 1 tablet (10 mg total) by mouth every 8 (eight) hours as needed for muscle spasms. 08/08/15   Sean Hommel, DO  eletriptan (RELPAX) 40 MG tablet Take 1 tablet (40 mg total) by mouth as needed for migraine or headache. May repeat in 2 hours if headache persists or recurs. 07/20/15   Hali Marry, MD  FLUoxetine (PROZAC) 20 MG tablet TAKE 2 TABLETS(40 MG) BY MOUTH DAILY 01/24/16   Hali Marry, MD  fluticasone [facility]) 50 MCG/ACT nasal spray Place 2 sprays into the nose as needed. 06/20/12   Hali Marry, MD  ibuprofen (ADVIL,MOTRIN) 600 MG tablet TAKE 1 TABLET BY MOUTH EVERY 6 HOURS AS NEEDED FOR PAIN 04/16/13   Hali Marry, MD  meloxicam (MOBIC) 7.5 MG tablet Take 1 tablet (7.5 mg total) by mouth 2 (two) times daily. 04/13/15   Paticia Stack, PA-C  mupirocin ointment (BACTROBAN) 2 % Apply to inside of each nares daily for 10 days then twice a week for maintenance. 09/29/15   Hali Marry, MD  predniSONE (DELTASONE) 20 MG tablet  3 tabs po day one, then 2 po daily x 4 days 02/13/16   Noland Fordyce, PA-C  topiramate (TOPAMAX) 25 MG tablet TAKE 1 TABLET BY MOUTH EVERY DAY AS DIRECTED 08/02/15   Paticia Stack, PA-C  valACYclovir (VALTREX) 500 MG tablet Take 1 tablet (500 mg total) by mouth 2 (two) times daily. 06/28/14   Guss Bunde, MD  zolpidem (AMBIEN CR) 12.5 MG CR tablet Take 1 tablet (12.5 mg total) by mouth at bedtime as needed for sleep. 02/03/15   Guss Bunde, MD   Meds Ordered and Administered this Visit  Medications - No data to display  BP 133/86 (BP Location: Left Arm)   Pulse 67   Temp 97.5 F (36.4 C) (Oral)   Resp 14   Wt 167 lb (75.8 kg)   SpO2 98%   BMI 24.66 kg/m  No data  found.   Physical Exam  Constitutional: She is oriented to person, place, and time. She appears well-developed and well-nourished. No distress.  HENT:  Head: Normocephalic and atraumatic.  Right Ear: Tympanic membrane normal.  Left Ear: Tympanic membrane normal.  Nose: Mucosal edema present. Right sinus exhibits maxillary sinus tenderness and frontal sinus tenderness. Left sinus exhibits maxillary sinus tenderness and frontal sinus tenderness.  Mouth/Throat: Uvula is midline, oropharynx is clear and moist and mucous membranes are normal.  Eyes: EOM are normal.  Neck: Normal range of motion.  Cardiovascular: Normal rate and regular rhythm.   Pulmonary/Chest: Effort normal and breath sounds normal. No respiratory distress. She has no wheezes. She has no rales.  Musculoskeletal: Normal range of motion.  Neurological: She is alert and oriented to person, place, and time.  Skin: Skin is warm and dry. She is not diaphoretic.  Psychiatric: She has a normal mood and affect. Her behavior is normal.  Nursing note and vitals reviewed.   Urgent Care Course     Procedures (including critical care time)  Labs Review Labs Reviewed - No data to display  Imaging Review No results found.    MDM   1. Acute rhinosinusitis    Hx and exam c/w sinusitis.  Rx: Augmentin and prednisone  F/u with PCP in 1 week if not improving.     Noland Fordyce, PA-C 02/13/16 [phone]

## 2016-02-13 NOTE — ED Triage Notes (Signed)
Patient c/o HA x 1 week. Sinus pressure and congestion x 4 days. Taken Mucinex D and IBF otc.

## 2016-02-20 ENCOUNTER — Other Ambulatory Visit: Payer: Self-pay | Admitting: Family Medicine

## 2016-02-25 ENCOUNTER — Other Ambulatory Visit: Payer: Self-pay | Admitting: Family Medicine

## 2016-02-27 ENCOUNTER — Telehealth: Payer: Self-pay | Admitting: *Deleted

## 2016-02-27 MED ORDER — PREDNISONE 20 MG PO TABS: ORAL_TABLET | ORAL | 0 refills | Status: DC

## 2016-02-27 NOTE — Telephone Encounter (Signed)
Patient left a message reporting she is minimally improved and often needs a 2nd round of prednisone and antibiotic when she gets a sinus infection. If you can refill she would like to use [facility] in [place].

## 2016-02-27 NOTE — Telephone Encounter (Signed)
Patient would like prednisone escribed to [facility] please.

## 2016-02-27 NOTE — Telephone Encounter (Signed)
LMOM with Erin's instructions. Call back and notify us if you want prednisone called in.

## 2016-02-29 ENCOUNTER — Ambulatory Visit: Payer: BC Managed Care – PPO | Admitting: Family Medicine

## 2016-03-28 ENCOUNTER — Other Ambulatory Visit: Payer: Self-pay | Admitting: Family Medicine

## 2016-03-28 ENCOUNTER — Other Ambulatory Visit: Payer: Self-pay | Admitting: *Deleted

## 2016-04-25 ENCOUNTER — Other Ambulatory Visit: Payer: Self-pay | Admitting: Family Medicine

## 2016-04-30 ENCOUNTER — Other Ambulatory Visit: Payer: Self-pay | Admitting: Family Medicine

## 2016-05-09 ENCOUNTER — Telehealth: Payer: Self-pay | Admitting: Family Medicine

## 2016-05-09 NOTE — Telephone Encounter (Signed)
Left pt a message to call our office and schedule a f/u appt with Dr. Madilyn Fireman

## 2016-05-18 ENCOUNTER — Other Ambulatory Visit: Payer: Self-pay | Admitting: Family Medicine

## 2016-05-22 ENCOUNTER — Encounter: Payer: Self-pay | Admitting: Family Medicine

## 2016-05-22 ENCOUNTER — Ambulatory Visit (INDEPENDENT_AMBULATORY_CARE_PROVIDER_SITE_OTHER): Payer: BC Managed Care – PPO | Admitting: Family Medicine

## 2016-05-22 VITALS — BP 120/72 | HR 85 | Ht 69.0 in | Wt 175.0 lb

## 2016-05-22 DIAGNOSIS — G43011 Migraine without aura, intractable, with status migrainosus: Secondary | ICD-10-CM

## 2016-05-22 DIAGNOSIS — F5101 Primary insomnia: Secondary | ICD-10-CM

## 2016-05-22 DIAGNOSIS — F3281 Premenstrual dysphoric disorder: Secondary | ICD-10-CM | POA: Diagnosis not present

## 2016-05-22 MED ORDER — CYCLOBENZAPRINE HCL 10 MG PO TABS: 10.0000 mg | ORAL_TABLET | Freq: Three times a day (TID) | ORAL | 1 refills | Status: DC | PRN

## 2016-05-22 MED ORDER — SUVOREXANT 10 MG PO TABS: 10.0000 mg | ORAL_TABLET | Freq: Every day | ORAL | 0 refills | Status: DC

## 2016-05-22 MED ORDER — ELETRIPTAN HYDROBROMIDE 40 MG PO TABS: 40.0000 mg | ORAL_TABLET | ORAL | 6 refills | Status: DC | PRN

## 2016-05-22 MED ORDER — TOPIRAMATE 50 MG PO TABS: 50.0000 mg | ORAL_TABLET | Freq: Two times a day (BID) | ORAL | 3 refills | Status: DC

## 2016-05-22 MED ORDER — FLUOXETINE HCL 20 MG PO TABS: 40.0000 mg | ORAL_TABLET | Freq: Every day | ORAL | 3 refills | Status: DC

## 2016-05-22 MED ORDER — SUVOREXANT 15 MG PO TABS: 15.0000 mg | ORAL_TABLET | Freq: Every day | ORAL | 0 refills | Status: DC

## 2016-05-22 MED ORDER — SUVOREXANT 20 MG PO TABS: 20.0000 mg | ORAL_TABLET | Freq: Every day | ORAL | 0 refills | Status: DC

## 2016-05-22 NOTE — Progress Notes (Signed)
Subjective:    CC:   HPI:  Follow-up migraine headaches without aura is-he is currently on Topamax.She says the Topamax is cut her headaches in half. She was getting about 20 per month and she's now down to about 10 per month and has been having very happy with her regimen. She still uses a nasal spray as needed. Uses Relpax as needed for rescue.  Follow-up insomnia-the Ambien caused her to sleep walk and sleep talk. She was going into the kitchen. She has stopped the medication and is now back to having problems sleeping. She's tried over-the-counter medications she's tried hot teas and just can't seem to fall asleep well. A lot of times her mind races at night. She is a Education officer, museum. She tried changing her diet has decreased her sugar intake.   F/U PMDD - Doing doing well on fluoxetine. She has now not had a period from his 6 months.  Past medical history, Surgical history, Family history not pertinant except as noted below, Social history, Allergies, and medications have been entered into the medical record, reviewed, and corrections made.   Review of Systems: No fevers, chills, night sweats, weight loss, chest pain, or shortness of breath.   Objective:    General: Well Developed, well nourished, and in no acute distress.  Neuro: Alert and oriented x3, extra-ocular muscles intact, sensation grossly intact.  HEENT: Normocephalic, atraumatic  Skin: Warm and dry, no rashes. Cardiac: Regular rate and rhythm, no murmurs rubs or gallops, no lower extremity edema.  Respiratory: Clear to auscultation bilaterally. Not using accessory muscles, speaking in full sentences.   Impression and Recommendations:   Migraine headaches without aura-We discussed options. Will increase Topamax to 59 g twice a day and see if this improves her headaches. Follow-up in 6 months.  Insomnia-added Ambien to her intolerance list.  Thus options that we could try. We'll start with the summer. We can try each dose  for 10 days and see what is most effective. I think this will be more gentle and mild. And has a better side effect profile.  PMDD - continue fluoxetine. Went ahead and refill for one year.  Perimenopausal-she's been amenorrheic for 6 months. She's really work on changing her diet and is going to work on improving her sleep quality.  REfilled her muscle relaxer as well.

## 2016-05-24 ENCOUNTER — Telehealth: Payer: Self-pay | Admitting: Family Medicine

## 2016-05-24 NOTE — Telephone Encounter (Signed)
Received request for PA on Belsomra 10 mg sent through cover my meds waiting on determination. - CF

## 2016-05-28 ENCOUNTER — Other Ambulatory Visit: Payer: Self-pay | Admitting: Family Medicine

## 2016-06-08 ENCOUNTER — Telehealth: Payer: Self-pay | Admitting: *Deleted

## 2016-06-08 NOTE — Telephone Encounter (Signed)
We do not really use benzodiazepines for sleep. We could certainly try something else such as Lunesta. It is chemically different than the Belsomra and the Ambien. I will put the prescription and independent and if she is okay with that then we can send that to her pharmacy. It's now generic so it should be reasonable.Marland Kitchen

## 2016-06-08 NOTE — Telephone Encounter (Signed)
Pt called and lvm stating that the Belsomra is not working for her . She stated that she is having bad dreams, sleep walking and talking in her sleep. She asked if Dr. Madilyn Fireman would write a prescription for Ativan.   Will fwd to pcp for advice.Lacey Jensen [place]

## 2016-06-11 NOTE — Telephone Encounter (Signed)
lvm informing pt of recommendations. Advised to rtn call if ok with prescription.Lacey Jensen [place]

## 2016-06-12 MED ORDER — ESZOPICLONE 2 MG PO TABS: 2.0000 mg | ORAL_TABLET | Freq: Every evening | ORAL | 1 refills | Status: DC | PRN

## 2016-06-12 NOTE — Telephone Encounter (Signed)
rx sent.  Catherine Metheney, MD  

## 2016-06-12 NOTE — Telephone Encounter (Signed)
Lacey Jensen advised of recommendations. She agreed to try the [facility]. Printed and put in Dr [employer].

## 2016-06-26 ENCOUNTER — Other Ambulatory Visit: Payer: Self-pay | Admitting: *Deleted

## 2016-06-26 MED ORDER — VALACYCLOVIR HCL 500 MG PO TABS: 500.0000 mg | ORAL_TABLET | Freq: Two times a day (BID) | ORAL | 2 refills | Status: DC

## 2016-07-31 ENCOUNTER — Ambulatory Visit: Payer: BC Managed Care – PPO | Admitting: Obstetrics & Gynecology

## 2016-08-20 ENCOUNTER — Ambulatory Visit (INDEPENDENT_AMBULATORY_CARE_PROVIDER_SITE_OTHER): Payer: BC Managed Care – PPO | Admitting: Obstetrics & Gynecology

## 2016-08-20 ENCOUNTER — Encounter: Payer: Self-pay | Admitting: Obstetrics & Gynecology

## 2016-08-20 VITALS — BP 127/78 | HR 92 | Resp 16 | Ht 69.0 in | Wt 175.0 lb

## 2016-08-20 DIAGNOSIS — Z01419 Encounter for gynecological examination (general) (routine) without abnormal findings: Secondary | ICD-10-CM | POA: Diagnosis not present

## 2016-08-20 DIAGNOSIS — Z124 Encounter for screening for malignant neoplasm of cervix: Secondary | ICD-10-CM | POA: Diagnosis not present

## 2016-08-20 DIAGNOSIS — Z1151 Encounter for screening for human papillomavirus (HPV): Secondary | ICD-10-CM

## 2016-08-20 NOTE — Progress Notes (Signed)
Subjective:     Lacey Jensen is a 51 y.o. female here for a routine exam.  Current complaints: difficulty sleeping not responding to [place] (and [place] causes vivid dreams).  Pt feeling anxious and can't quiet her thoughts at night.  Pt feels anxious and is tearful talking about it.    Pt entering menopause.  No menses for 9 months now.    Gynecologic History No LMP recorded. Patient is perimenopausal. Contraception: none Last Pap: 2014. Results were: normal Last mammogram: 2012 (did not keep lasat appt0. Results were: normal  Obstetric History OB History  Gravida Para Term Preterm AB Living  2 2       2   SAB TAB Ectopic Multiple Live Births               # Outcome Date GA Lbr Len/2nd Weight Sex Delivery Anes PTL Lv  2 Para      Vag-Spont     1 Para      Vag-Spont          The following portions of the patient's history were reviewed and updated as appropriate: allergies, current medications, past family history, past medical history, past social history, past surgical history and problem list.  Review of Systems Pertinent items noted in HPI and remainder of comprehensive ROS otherwise negative.    Objective:      Vitals:   08/20/16 1531  BP: 127/78  Pulse: 92  Resp: 16  Weight: 175 lb (79.4 kg)  Height: 5\' 9"  (1.753 m)   Vitals:  WNL General appearance: alert, cooperative and no distress  HEENT: Normocephalic, without obvious abnormality, atraumatic Eyes: negative Throat: lips, mucosa, and tongue normal; teeth and gums normal  Respiratory: Clear to auscultation bilaterally  CV: Regular rate and rhythm  Breasts:  Normal appearance, no masses or tenderness, no nipple retraction or dimpling  GI: Soft, non-tender; bowel sounds normal; no masses,  no organomegaly  GU: External Genitalia:  Tanner V, no lesion Urethra:  No prolapse   Vagina: PALE Pink, normal rugae, no blood or discharge  Cervix: No CMT, no lesion  Uterus:  Normal size and contour, non tender   Adnexa: Normal, no masses, non tender  Musculoskeletal: No edema, redness or tenderness in the calves or thighs  Skin: No lesions or rash  Lymphatic: Axillary adenopathy: none     Psychiatric: Normal mood and behavior        Assessment:    Healthy female exam. --Approaching menopause Anxiety--GAD = 10   Plan:  1-pap with cotesting 2-Mammogram 3-Pt to go back to Dr. Joellyn Quails to discuss anxiety and sleep disturance.  Possible change to SSRI/SNRI  to help with anxiety better, referral to counseling, etc. . . 4-colonoscopy

## 2016-08-21 NOTE — Telephone Encounter (Signed)
Received approval from [facility] for [place]   PA# [place] [id number] Non-Grandfathered [id number] JM  Good from 05/24/16 - 05/25/2019

## 2016-08-22 LAB — CYTOLOGY - PAP
DIAGNOSIS: NEGATIVE
HPV (WINDOPATH): NOT DETECTED

## 2016-08-29 ENCOUNTER — Ambulatory Visit: Payer: BC Managed Care – PPO

## 2016-09-05 ENCOUNTER — Ambulatory Visit: Payer: BC Managed Care – PPO

## 2016-09-10 ENCOUNTER — Ambulatory Visit: Payer: BC Managed Care – PPO | Admitting: Family Medicine

## 2016-09-18 ENCOUNTER — Telehealth: Payer: Self-pay | Admitting: *Deleted

## 2016-09-18 NOTE — Telephone Encounter (Signed)
LM on voicemail to call to discuss scheduling her f/u Rt breast U/S.

## 2016-09-18 NOTE — Telephone Encounter (Signed)
-----   Message from Guss Bunde, MD sent at 09/06/2016  4:13 PM EDT ----- Regarding: FW: [place] needed before MAMMO Can you do please.  See below  ----- Message ----- From: Antonietta Barcelona Sent: 09/05/2016   1:33 PM To: Guss Bunde, MD Subject: [place] needed before MAMMO                         Good Afternoon! The above PT Lacey Jensen, MRN [id number] has to return for a f/u US  BI-RADS CATEGORY 3: Probably benign finding(s) - short interval follow-up suggested.  Recommend right breast ultrasound in 6 months to insure stability of the probable complicated cyst at the 9 o'clock position.  Original Report Authenticated By: Lura Em, M.D.  Pt would like this [place] order to be ordered at [facility] then to have her yearly Screening here with [place] when [place] is cleared. Unfortunately we can not do a screening mam until she has been cleared and since she didn't follow up in 2012 it has to be done now. Pt is aware and completely okay with this!!  Sorry for any inconvenience and Thanks!!!

## 2016-09-20 ENCOUNTER — Ambulatory Visit (INDEPENDENT_AMBULATORY_CARE_PROVIDER_SITE_OTHER): Payer: BC Managed Care – PPO | Admitting: Family Medicine

## 2016-09-20 ENCOUNTER — Encounter: Payer: Self-pay | Admitting: Family Medicine

## 2016-09-20 VITALS — BP 134/85 | HR 86 | Wt 176.0 lb

## 2016-09-20 DIAGNOSIS — Z780 Asymptomatic menopausal state: Secondary | ICD-10-CM

## 2016-09-20 DIAGNOSIS — G4709 Other insomnia: Secondary | ICD-10-CM

## 2016-09-20 DIAGNOSIS — F419 Anxiety disorder, unspecified: Secondary | ICD-10-CM | POA: Diagnosis not present

## 2016-09-20 NOTE — Progress Notes (Signed)
Subjective:    Patient ID: Lacey Jensen, female    DOB: April 22, 1965, 51 y.o.   MRN: [id number]  HPI 51 year old female here today to follow-up for insomnia. We've recently started [place]. She had previously tried Ambien and Belsomra.she says over the last year she just had very vivid intense dreams that happen most nights. Even when she doesn't take a sleep aid seems to happen but it's very disruptive to her sleep.she started to try to exercise some. She's downloaded app to help increase her activity level and she is up to 7 minutes a day.  Anxiety- she says she denies feeling down depressed or hopeless. In fact she feels quite happy but just feels overly anxious an amp.during the day. She feels like she worries excessively and gets almost panicky at times.  Last LMP was 9 months ago. She is now ikely postmenopausal. She wonders if some of her symptoms over the last year have been hormone related.   Review of Systems  BP 134/85   Pulse 86   Wt 176 lb (79.8 kg)   SpO2 98%   BMI 25.99 kg/m     Allergies  Allergen Reactions  . Ambien [Zolpidem] Other (See Comments)    Sleep walking and memory loss  . Belsomra [Suvorexant] Other (See Comments)    Bad dreams, sleep walking  . Sumatriptan Succinate     REACTION: Made her feel high  . Zolmitriptan     REACTION: Made her feel high    Past Medical History:  Diagnosis Date  . Abnormal Pap smear    Age 75 and age 35  . Dysplasia of cervix    leep  . Headache(784.0)   . Immunotherapy     Past Surgical History:  Procedure Laterality Date  . A GYN surgery  2002  . btl      Social History   Social History  . Marital status: Married    Spouse name: Gwyndolyn Saxon  . Number of children: 2  . Years of education: N/A   Occupational History  . Mortgage broker    Social History Main Topics  . Smoking status: Former Smoker    Quit date: 01/23/2004  . Smokeless tobacco: Never Used  . Alcohol use 0.5 oz/week    1 drink(s) per week   . Drug use: No  . Sexual activity: Yes   Other Topics Concern  . Not on file   Social History Narrative   She was adopted so we do not have an extensive family history. She does yoga for exercise and drinks 2 caffeinated drinks per day.    Family History  Problem Relation Age of Onset  . Migraines Son     Outpatient Encounter Prescriptions as of 09/20/2016  Medication Sig  . cyclobenzaprine (FLEXERIL) 10 MG tablet Take 1 tablet (10 mg total) by mouth every 8 (eight) hours as needed for muscle spasms.  Marland Kitchen eletriptan (RELPAX) 40 MG tablet Take 1 tablet (40 mg total) by mouth as needed for migraine or headache. May repeat in 2 hours if headache persists or recurs.  . eszopiclone (LUNESTA) 2 MG TABS tablet Take 1 tablet (2 mg total) by mouth at bedtime as needed for sleep. Take immediately before bedtime  . FLUoxetine (PROZAC) 20 MG tablet Take 2 tablets (40 mg total) by mouth daily.  Marland Kitchen topiramate (TOPAMAX) 50 MG tablet Take 1 tablet (50 mg total) by mouth 2 (two) times daily.  . valACYclovir (VALTREX) 500 MG tablet Take 1  tablet (500 mg total) by mouth 2 (two) times daily.   No facility-administered encounter medications on file as of 09/20/2016.           Objective:   Physical Exam  Constitutional: She is oriented to person, place, and time. She appears well-developed and well-nourished.  HENT:  Head: Normocephalic and atraumatic.  Cardiovascular: Normal rate, regular rhythm and normal heart sounds.   Pulmonary/Chest: Effort normal and breath sounds normal.  Neurological: She is alert and oriented to person, place, and time.  Skin: Skin is warm and dry.  Psychiatric: She has a normal mood and affect. Her behavior is normal.        Assessment & Plan:  Insomnia - I really think that her insomnia is related to her mood or maybe even a medication side effect. At this point in time I'm not convinced necessarily just has primary insomnia.  Anxiety - GAD - 7 score of 11 today.   We'll start to wean her fluoxetine. She's currently taking 40 mg daily.  Menopausal - consider progesterone capsules at bedtime. We discussed this as an option to help with her insomnia.we also discussed the importance of regular exercise.

## 2016-09-20 NOTE — Patient Instructions (Addendum)
Decrease the fluoxetine to 30mg  ( 1.5 tabs) daily for 10 days, then decrease to 1 tab daily for 10 days, then 0.5 tab daily for 7 days, and then call for new script for the 10mg  tabs.

## 2016-10-18 ENCOUNTER — Other Ambulatory Visit: Payer: Self-pay | Admitting: Family Medicine

## 2016-10-18 LAB — HM MAMMOGRAPHY

## 2016-10-24 ENCOUNTER — Telehealth: Payer: Self-pay | Admitting: *Deleted

## 2016-10-24 MED ORDER — FLUOXETINE HCL 10 MG PO TABS: 10.0000 mg | ORAL_TABLET | Freq: Every day | ORAL | 0 refills | Status: DC

## 2016-10-24 NOTE — Telephone Encounter (Signed)
Pt called and stated that she has done the taper of the fluoxetine.  And is ready to try the new medication. She would like this sent to [facility].  Looked back at last OV note and will send over the script for fluoxetine10 mg tabs .Lacey Jensen   Decrease the fluoxetine to 30mg  ( 1.5 tabs) daily for 10 days, then decrease to 1 tab daily for 10 days, then 0.5 tab daily for 7 days, and then call for new script for the 10mg  tabs.

## 2016-11-02 ENCOUNTER — Ambulatory Visit (INDEPENDENT_AMBULATORY_CARE_PROVIDER_SITE_OTHER): Payer: BC Managed Care – PPO | Admitting: Physician Assistant

## 2016-11-02 VITALS — BP 101/75 | HR 99 | Wt 174.5 lb

## 2016-11-02 DIAGNOSIS — G43011 Migraine without aura, intractable, with status migrainosus: Secondary | ICD-10-CM | POA: Diagnosis not present

## 2016-11-02 DIAGNOSIS — G4709 Other insomnia: Secondary | ICD-10-CM | POA: Diagnosis not present

## 2016-11-02 DIAGNOSIS — M62838 Other muscle spasm: Secondary | ICD-10-CM

## 2016-11-02 MED ORDER — UNABLE TO FIND: 225.0000 mg | 11 refills | Status: DC

## 2016-11-02 MED ORDER — TOPIRAMATE 100 MG PO TABS: 100.0000 mg | ORAL_TABLET | Freq: Two times a day (BID) | ORAL | 2 refills | Status: DC

## 2016-11-02 MED ORDER — CYCLOBENZAPRINE HCL 10 MG PO TABS: 10.0000 mg | ORAL_TABLET | Freq: Three times a day (TID) | ORAL | 2 refills | Status: DC | PRN

## 2016-11-02 NOTE — Patient Instructions (Signed)

## 2016-11-02 NOTE — Progress Notes (Signed)
History:  Lacey Jensen is a 51 y.o. G2P2 who presents to clinic today for migraine followup.  She is menopausal and this may be contributing to worsening of HA. She had HA 2 weeks ago that just would not improve.  Usually, she is able to go to sleep and this helps her HA (in additino to the medication she uses).  The worst part lately has been the associated neck pain on the left side.  She sometimes has massage which helps.  She has used the flexeril infrequently.  She is using Topamax 100mg  daily at present.  She is uncertain if she receives benefit from this.      Past Medical History:  Diagnosis Date  . Abnormal Pap smear    Age 37 and age 37  . Dysplasia of cervix    leep  . Headache(784.0)   . Immunotherapy     Social History   Social History  . Marital status: Married    Spouse name: Gwyndolyn Saxon  . Number of children: 2  . Years of education: N/A   Occupational History  . Mortgage broker    Social History Main Topics  . Smoking status: Former Smoker    Quit date: 01/23/2004  . Smokeless tobacco: Never Used  . Alcohol use 0.5 oz/week    1 drink(s) per week  . Drug use: No  . Sexual activity: Yes   Other Topics Concern  . Not on file   Social History Narrative   She was adopted so we do not have an extensive family history. She does yoga for exercise and drinks 2 caffeinated drinks per day.    Family History  Problem Relation Age of Onset  . Migraines Son     Allergies  Allergen Reactions  . Ambien [Zolpidem] Other (See Comments)    Sleep walking and memory loss  . Belsomra [Suvorexant] Other (See Comments)    Bad dreams, sleep walking  . Sumatriptan Succinate     REACTION: Made her feel high  . Zolmitriptan     REACTION: Made her feel high    Current Outpatient Prescriptions on File Prior to Visit  Medication Sig Dispense Refill  . cyclobenzaprine (FLEXERIL) 10 MG tablet Take 1 tablet (10 mg total) by mouth every 8 (eight) hours as needed for muscle  spasms. 30 tablet 1  . eszopiclone (LUNESTA) 2 MG TABS tablet Take 1 tablet (2 mg total) by mouth at bedtime as needed for sleep. Take immediately before bedtime 30 tablet 1  . FLUoxetine (PROZAC) 10 MG tablet Take 1 tablet (10 mg total) by mouth daily. 90 tablet 0  . RELPAX 40 MG tablet TAKE 1 TABLET( 40 MG TOTAL) BY MOUTH AS NEEDED FOR MIGRAINE OR HEADACHE, MAY REPEAT IN 2 HOURS IF HEADACHE PERSISTS OR RECURS 10 tablet 11  . topiramate (TOPAMAX) 50 MG tablet Take 1 tablet (50 mg total) by mouth 2 (two) times daily. 180 tablet 3  . valACYclovir (VALTREX) 500 MG tablet Take 1 tablet (500 mg total) by mouth 2 (two) times daily. 14 tablet 2   No current facility-administered medications on file prior to visit.      Review of Systems:  All pertinent positive/negative included in HPI, all other review of systems are negative   Objective:  Physical Exam BP 101/75   Pulse 99   Wt 174 lb 8 oz (79.2 kg)   BMI 25.77 kg/m  CONSTITUTIONAL: Well-developed, well-nourished female in no acute distress.  EYES: EOM intact ENT:  Normocephalic CARDIOVASCULAR: Regular rate   RESPIRATORY: Normal rate. MUSCULOSKELETAL: Normal ROM SKIN: Warm, dry without erythema  NEUROLOGICAL: Alert, oriented, CN II-XII grossly intact, Appropriate balance PSYCH: Normal behavior, mood   Assessment & Plan:  Assessment: 1. Other insomnia   2. Intractable migraine without aura and with status migrainosus   3.  Muscle spasm None of the above are improving.  HA is worsening  Plan: Ajovy - new injectable medication - CGRP blocker.  Pt will trial once monthly.  She is agreeable to watch the online video prior to self-injection.  Significant time spent on the use of this medication and expectations.  She declines to return to the office for the injection.   IF she has difficulty getting the ajovy- she will increase the Topamax in hopes of increased migraine prevention   Follow-up in 2-3 months or sooner PRN  Paticia Stack, PA-C 11/02/2016 10:03 AM

## 2016-11-11 IMAGING — CR DG FOOT COMPLETE 3+V*R*
3 series · 3 of 3 positions shown · non-contrast
Comparison: None.

CLINICAL DATA: Pt hit right foot on Nogay stool 3 days ago, c/o
pain 2nd and 3rd toes

EXAM:
RIGHT FOOT COMPLETE - 3+ VIEW

[foot ap]
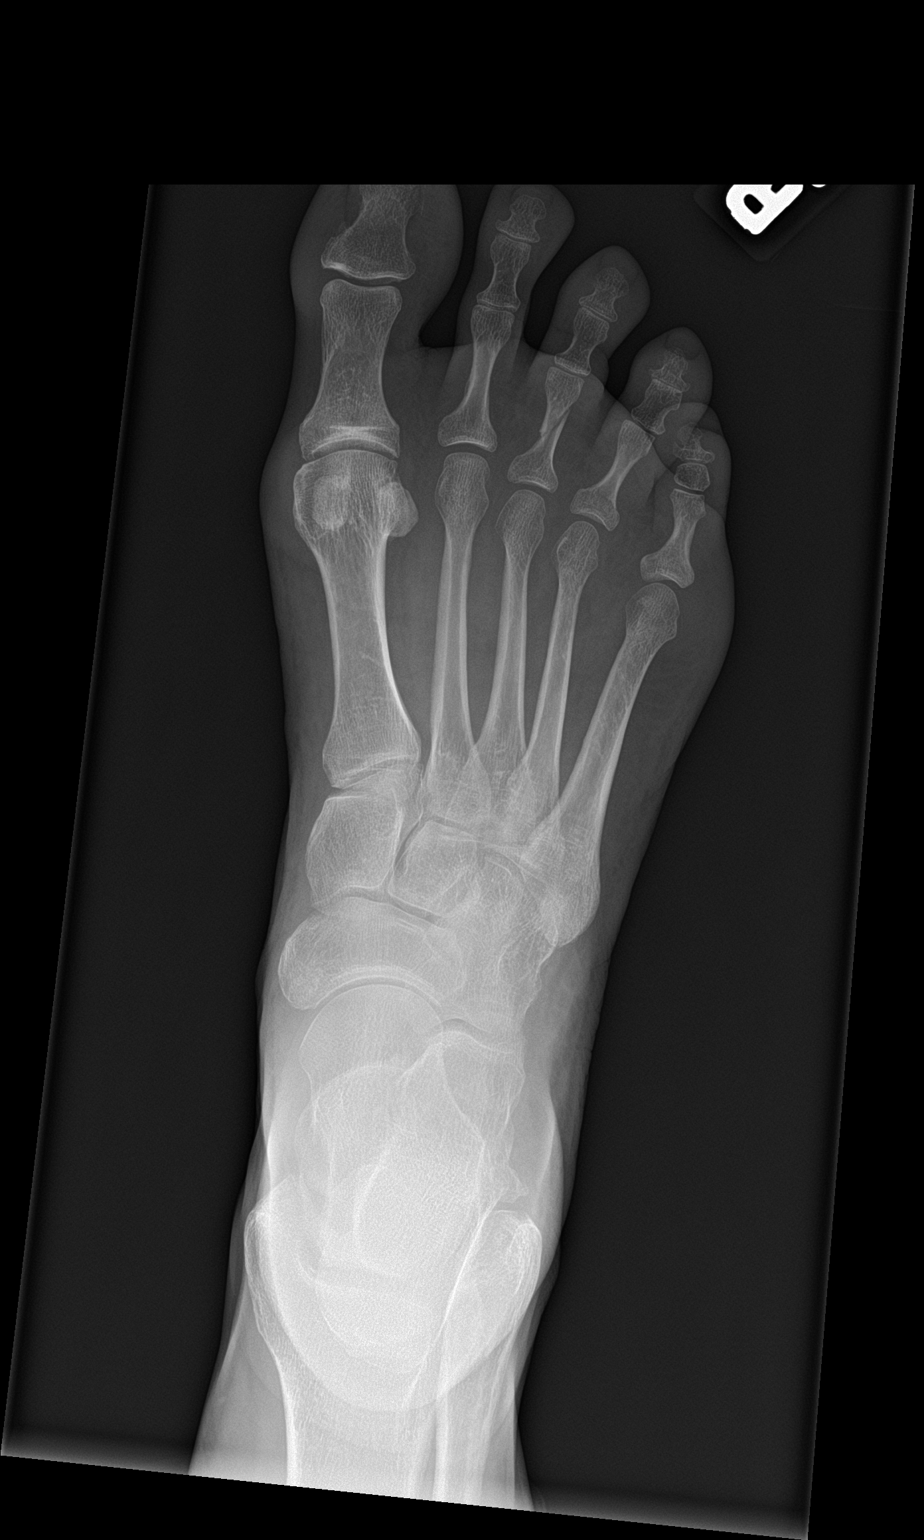

[foot obl]
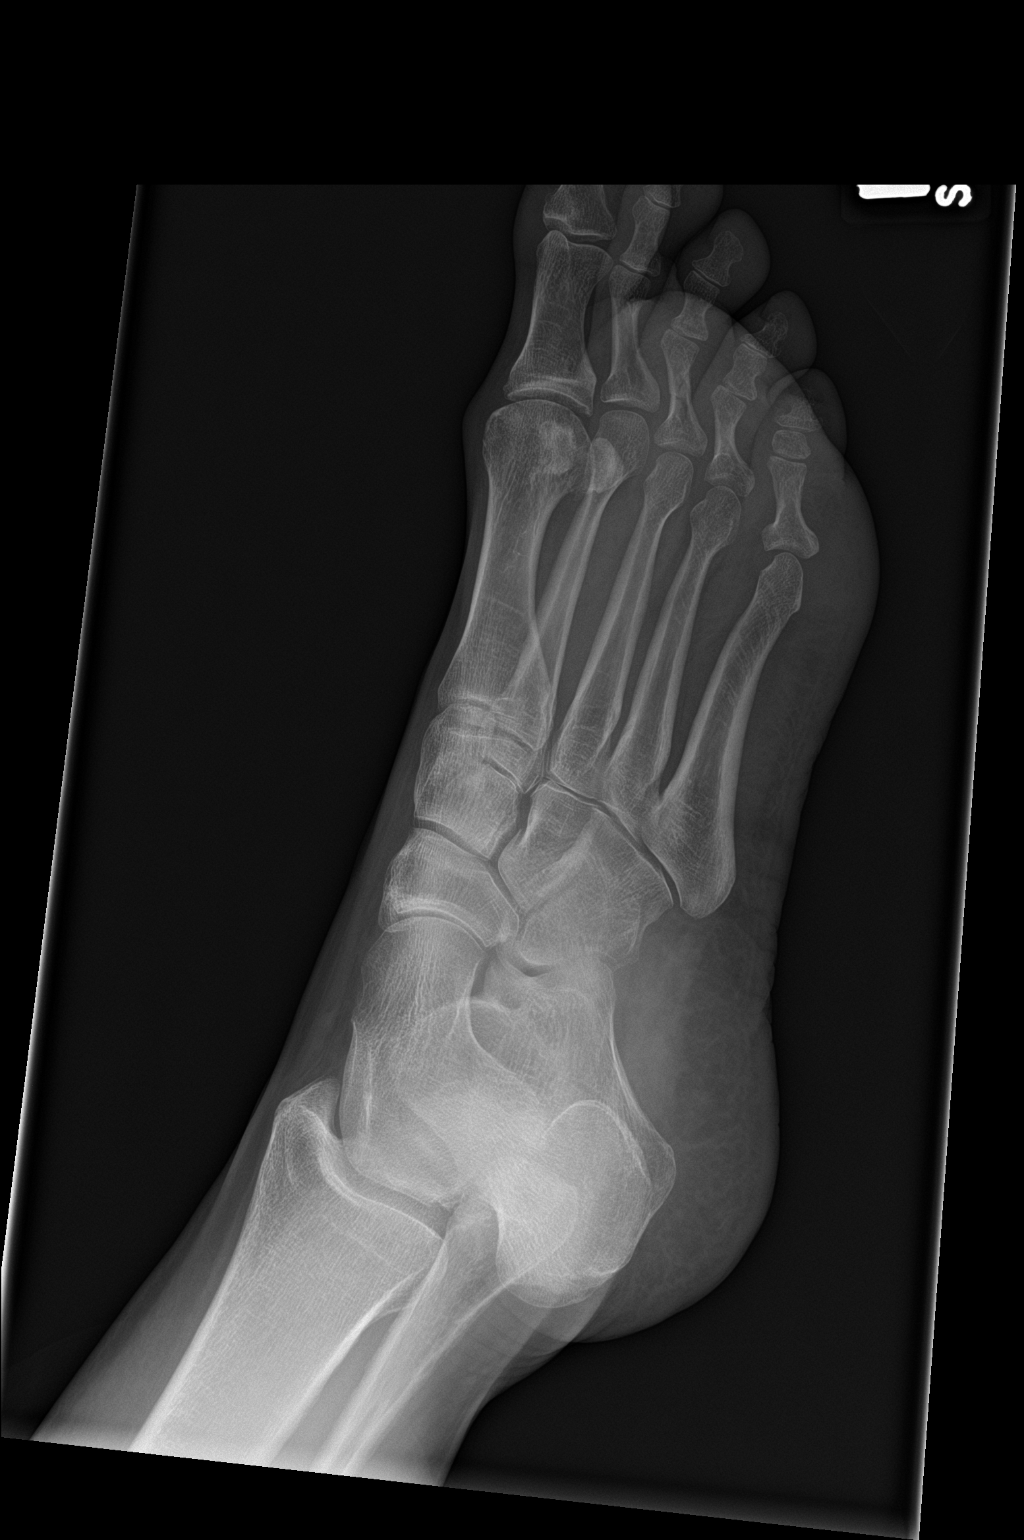

[foot lat]
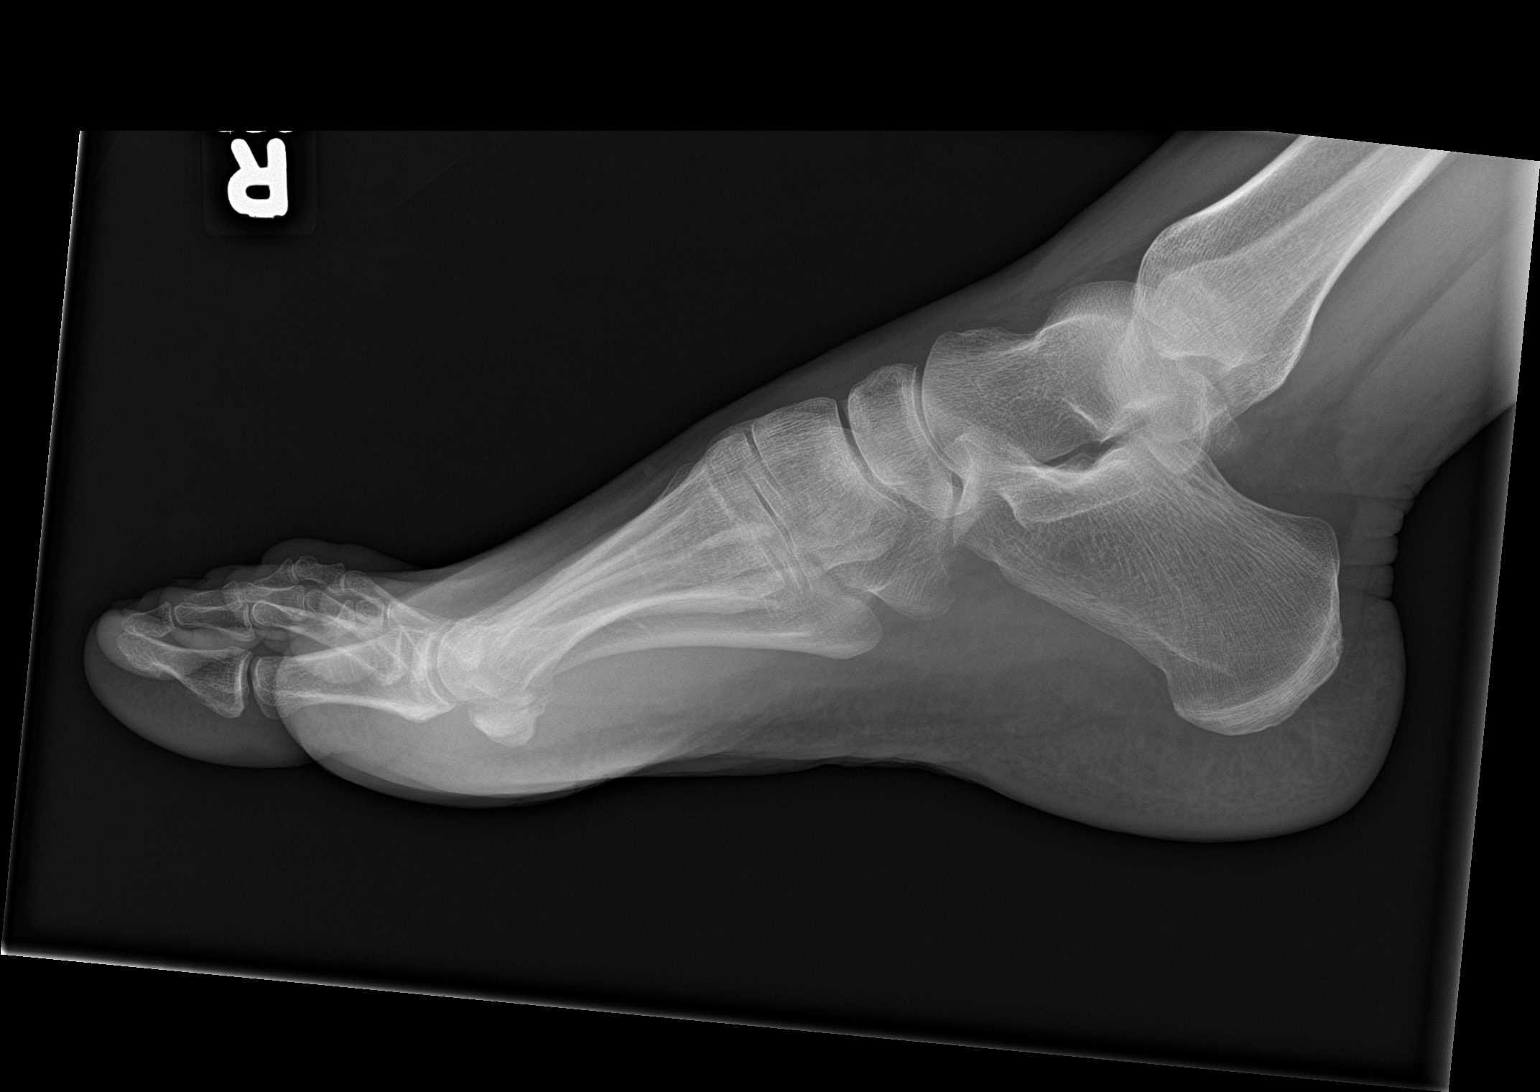

[3 of 3 positions shown; findings below may reference images not displayed]

FINDINGS: Oblique nondisplaced fracture third proximal phalanx. No other
abnormalities.
IMPRESSION: Fracture third proximal phalanx

## 2016-11-27 ENCOUNTER — Telehealth: Payer: Self-pay | Admitting: *Deleted

## 2016-11-27 NOTE — Telephone Encounter (Signed)
Pt call she stated that she had left multiple vm's regarding medication change that she and Dr. Madilyn Fireman had discussed previously. I called her back and left message asking that she rtn call about this and that I would fwd this to her.Lacey Jensen

## 2016-11-28 MED ORDER — VENLAFAXINE HCL ER 37.5 MG PO CP24: 37.5000 mg | ORAL_CAPSULE | Freq: Every day | ORAL | 0 refills | Status: DC

## 2016-11-28 NOTE — Telephone Encounter (Signed)
Pt informed of recommendations. She will call back to schedule appt.Lacey Jensen

## 2016-11-28 NOTE — Telephone Encounter (Signed)
[facility] called and I called her back. She states her headache specialist Allie Dimmer, PA-C recommends patient have Dr Madilyn Fireman start her on Effexor. Please advise.

## 2016-11-28 NOTE — Telephone Encounter (Signed)
Okay, prescription sent to the pharmacy for low-dose Effexor.  We will start with 37.5 mg.  Would like to see her in about 3 weeks that we can follow-up and adjust her dose if needed.

## 2016-12-07 ENCOUNTER — Encounter: Payer: BC Managed Care – PPO | Admitting: Physician Assistant

## 2016-12-28 ENCOUNTER — Ambulatory Visit (INDEPENDENT_AMBULATORY_CARE_PROVIDER_SITE_OTHER): Payer: BC Managed Care – PPO | Admitting: Family Medicine

## 2016-12-28 ENCOUNTER — Encounter: Payer: Self-pay | Admitting: Family Medicine

## 2016-12-28 VITALS — BP 130/83 | HR 82 | Ht 68.0 in | Wt 177.0 lb

## 2016-12-28 DIAGNOSIS — F5101 Primary insomnia: Secondary | ICD-10-CM | POA: Diagnosis not present

## 2016-12-28 DIAGNOSIS — G43009 Migraine without aura, not intractable, without status migrainosus: Secondary | ICD-10-CM | POA: Diagnosis not present

## 2016-12-28 DIAGNOSIS — F3281 Premenstrual dysphoric disorder: Secondary | ICD-10-CM | POA: Diagnosis not present

## 2016-12-28 MED ORDER — FLUOXETINE HCL 20 MG PO TABS: ORAL_TABLET | ORAL | 2 refills | Status: DC

## 2016-12-28 NOTE — Progress Notes (Signed)
Subjective:    Patient ID: Lacey Jensen, female    DOB: 1965/10/16, 51 y.o.   MRN: [id number]  HPI 51 year old female comes in today to follow-up for mood.  She was previously taking fluoxetine for premenstrual dysphoric disorder but was recently switched to Effexor as per recommendation by her neurologist.  She has a history of migraines with aura.  She is now on a Ajovy and doing well with her injections.  Really has not like the Effexor.  She feels like she is just on edge and irritable.  She would really like to switch back to the fluoxetine.  She felt like it worked much better for her.  She was up to 40 mg a day before we decided to taper it.  He did go for her mammogram at [facility].  She has never had colon cancer screening and she is now 6.  Insomnia-she does still use her sleep aid consistently.  Migraine headaches-she is now on a new medication as noted above.  She is actually doing really well with this.  Review of Systems  BP 130/83   Pulse 82   Ht 5\' 8"  (1.727 m)   Wt 177 lb (80.3 kg)   SpO2 100%   BMI 26.91 kg/m     Allergies  Allergen Reactions  . Ambien [Zolpidem] Other (See Comments)    Sleep walking and memory loss  . Belsomra [Suvorexant] Other (See Comments)    Bad dreams, sleep walking  . Sumatriptan Succinate     REACTION: Made her feel high  . Zolmitriptan     REACTION: Made her feel high    Past Medical History:  Diagnosis Date  . Abnormal Pap smear    Age 37 and age 8  . Dysplasia of cervix    leep  . Headache(784.0)   . Immunotherapy     Past Surgical History:  Procedure Laterality Date  . A GYN surgery  2002  . btl      Social History   Socioeconomic History  . Marital status: Married    Spouse name: Gwyndolyn Saxon  . Number of children: 2  . Years of education: Not on file  . Highest education level: Not on file  Social Needs  . Financial resource strain: Not on file  . Food insecurity - worry: Not on file  . Food insecurity -  inability: Not on file  . Transportation needs - medical: Not on file  . Transportation needs - non-medical: Not on file  Occupational History  . Occupation: Art gallery manager  Tobacco Use  . Smoking status: Former Smoker    Last attempt to quit: 01/23/2004    Years since quitting: 12.9  . Smokeless tobacco: Never Used  Substance and Sexual Activity  . Alcohol use: Yes    Alcohol/week: 0.5 oz    Types: 1 drink(s) per week  . Drug use: No  . Sexual activity: Yes  Other Topics Concern  . Not on file  Social History Narrative   She was adopted so we do not have an extensive family history. She does yoga for exercise and drinks 2 caffeinated drinks per day.    Family History  Problem Relation Age of Onset  . Migraines Son     Outpatient Encounter Medications as of 12/28/2016  Medication Sig  . AJOVY 225 MG/1.5ML SOSY Inject 1.5 mg into the skin every 30 (thirty) days.  . cyclobenzaprine (FLEXERIL) 10 MG tablet Take 1 tablet (10 mg total) by mouth  every 8 (eight) hours as needed for muscle spasms.  . eszopiclone (LUNESTA) 2 MG TABS tablet Take 1 tablet (2 mg total) by mouth at bedtime as needed for sleep. Take immediately before bedtime  . FLUoxetine (PROZAC) 20 MG tablet 1/2 tab po QD x 6 days, then increase to 1 tab PO QD x 2 weeks, then increase to 2 tab po QD.  Marland Kitchen RELPAX 40 MG tablet TAKE 1 TABLET( 40 MG TOTAL) BY MOUTH AS NEEDED FOR MIGRAINE OR HEADACHE, MAY REPEAT IN 2 HOURS IF HEADACHE PERSISTS OR RECURS  . topiramate (TOPAMAX) 100 MG tablet Take 1 tablet (100 mg total) by mouth 2 (two) times daily.  . valACYclovir (VALTREX) 500 MG tablet Take 1 tablet (500 mg total) by mouth 2 (two) times daily.  . [DISCONTINUED] FLUoxetine (PROZAC) 10 MG tablet Take 1 tablet (10 mg total) by mouth daily.  . [DISCONTINUED] UNABLE TO FIND Inject 225 mg as directed every 30 (thirty) days. Med Name: Ajovy  . [DISCONTINUED] venlafaxine XR (EFFEXOR-XR) 37.5 MG 24 hr capsule Take 1 capsule (37.5 mg  total) daily with breakfast by mouth.   No facility-administered encounter medications on file as of 12/28/2016.         Objective:   Physical Exam  Constitutional: She is oriented to person, place, and time. She appears well-developed and well-nourished.  HENT:  Head: Normocephalic and atraumatic.  Cardiovascular: Normal rate, regular rhythm and normal heart sounds.  Pulmonary/Chest: Effort normal and breath sounds normal.  Neurological: She is alert and oriented to person, place, and time.  Skin: Skin is warm and dry.  Psychiatric: She has a normal mood and affect. Her behavior is normal.        Assessment & Plan:  MDD-we will switch back to fluoxetine and wean the Effexor.  She can work herself back up to 40 mg a day for several weeks.  PHQ 9 score of 3 and gad 7 score of 6.  Migraine headaches-following with neurology.  Currently onAjovy using Relpax as needed for rescue.  Chronic insomnia-continue with current regimen.  Okay to refill Lunesta if needed.  Discused need for colon cancer screening.  His current options.  She will check with her insurance on coverage for colonoscopy versus [place] guard versus stool cards and let me know.

## 2016-12-28 NOTE — Patient Instructions (Signed)
Decrease the Effexor to 1 tab every other day for 1 week and then stop.  Okay to start the fluoxetine.  Just follow the instructions on the bottle.

## 2017-01-03 ENCOUNTER — Other Ambulatory Visit: Payer: Self-pay | Admitting: Family Medicine

## 2017-01-04 ENCOUNTER — Encounter: Payer: BC Managed Care – PPO | Admitting: Physician Assistant

## 2017-01-30 ENCOUNTER — Other Ambulatory Visit: Payer: Self-pay | Admitting: Physician Assistant

## 2017-02-01 ENCOUNTER — Encounter: Payer: Self-pay | Admitting: Family Medicine

## 2017-03-08 ENCOUNTER — Ambulatory Visit: Payer: BC Managed Care – PPO | Admitting: Family Medicine

## 2017-05-30 ENCOUNTER — Other Ambulatory Visit: Payer: Self-pay | Admitting: Physician Assistant

## 2017-05-30 DIAGNOSIS — G43011 Migraine without aura, intractable, with status migrainosus: Secondary | ICD-10-CM

## 2017-06-04 ENCOUNTER — Encounter: Payer: Self-pay | Admitting: Obstetrics & Gynecology

## 2017-06-04 ENCOUNTER — Ambulatory Visit: Payer: BC Managed Care – PPO | Admitting: Obstetrics & Gynecology

## 2017-06-04 ENCOUNTER — Telehealth: Payer: Self-pay | Admitting: Emergency Medicine

## 2017-06-04 VITALS — BP 143/87 | HR 83 | Ht 69.0 in | Wt 181.0 lb

## 2017-06-04 DIAGNOSIS — R140 Abdominal distension (gaseous): Secondary | ICD-10-CM | POA: Diagnosis not present

## 2017-06-04 DIAGNOSIS — R5383 Other fatigue: Secondary | ICD-10-CM

## 2017-06-04 DIAGNOSIS — F419 Anxiety disorder, unspecified: Secondary | ICD-10-CM

## 2017-06-04 NOTE — Telephone Encounter (Signed)
Sounds good. She can schedule with me or one of the other provider sooner if needed.

## 2017-06-04 NOTE — Progress Notes (Signed)
Patient complaining of feeling anxious. Previous hx of adderall use. Kathrene Alu RN  Scored 10 on anxiety GAD-7 and patient denies any suicidal thoughts or ideation. Kathrene Alu RN

## 2017-06-04 NOTE — Telephone Encounter (Signed)
Patient called following visit with her OB/GYN; anxiety issue came up; after discussion, she will call for appt.with Dr.Metheney next week to discuss.

## 2017-06-05 ENCOUNTER — Telehealth: Payer: Self-pay

## 2017-06-05 DIAGNOSIS — F419 Anxiety disorder, unspecified: Secondary | ICD-10-CM | POA: Insufficient documentation

## 2017-06-05 LAB — CBC
HCT: 37 % (ref 35.0–45.0)
HEMOGLOBIN: 12.7 g/dL (ref 11.7–15.5)
MCH: 30 pg (ref 27.0–33.0)
MCHC: 34.3 g/dL (ref 32.0–36.0)
MCV: 87.3 fL (ref 80.0–100.0)
MPV: 10.7 fL (ref 7.5–12.5)
Platelets: 258 10*3/uL (ref 140–400)
RBC: 4.24 10*6/uL (ref 3.80–5.10)
RDW: 12.2 % (ref 11.0–15.0)
WBC: 6.1 10*3/uL (ref 3.8–10.8)

## 2017-06-05 LAB — VITAMIN D 25 HYDROXY (VIT D DEFICIENCY, FRACTURES): Vit D, 25-Hydroxy: 31 ng/mL (ref 30–100)

## 2017-06-05 LAB — TSH: TSH: 4.12 mIU/L

## 2017-06-05 NOTE — Telephone Encounter (Addendum)
Pt is aware. ----- Message from Guss Bunde, MD sent at 06/05/2017  9:57 AM EDT ----- TSH high normal--recheck in 1 year. Vit D low nml--recheck in one year.  Nml CBC.  RN to call.

## 2017-06-05 NOTE — Progress Notes (Signed)
   Subjective:    Patient ID: Lacey Jensen, female    DOB: 10-02-1965, 52 y.o.   MRN: [id number]  HPI  52 year old female presents for feeling anxious, irritable, and not herself.  Patient does have hot flashes and night sweats.  She is interested in hormone replacement therapy.  She also feels fatigued.  She has not had a anemia or thyroid work-up to date.  Patient is on Prozac which helps her symptoms some.  Patient was on Effexor which did not control her anxiety symptoms as well.  She is also complaining of abdominal bloating.  Review of Systems  Constitutional: Positive for fatigue.  Respiratory: Negative.   Cardiovascular: Negative.   Gastrointestinal: Positive for abdominal distention.  Genitourinary: Negative for pelvic pain, vaginal bleeding, vaginal discharge and vaginal pain.  Neurological: Negative.   Psychiatric/Behavioral: The patient is nervous/anxious.        Objective:   Physical Exam  Constitutional: She is oriented to person, place, and time. She appears well-developed and well-nourished. No distress.  HENT:  Head: Normocephalic and atraumatic.  Eyes: Conjunctivae are normal.  Cardiovascular: Normal rate.  Pulmonary/Chest: Effort normal.  Abdominal: Soft. Bowel sounds are normal. She exhibits no distension and no mass. There is no tenderness. There is no rebound and no guarding.  Genitourinary: Vagina normal and uterus normal.  Genitourinary Comments: No ovarian masses.  Ovaries are palpated.  Musculoskeletal: She exhibits no edema.  Neurological: She is alert and oriented to person, place, and time.  Skin: Skin is warm and dry.  Psychiatric: She has a normal mood and affect.  Vitals reviewed.  Vitals:   06/04/17 1555  BP: (!) 143/87  Pulse: 83  Weight: 181 lb (82.1 kg)  Height: 5\' 9"  (1.753 m)   Assessment & Plan:  52 year old female presents for menopausal symptoms including hot flashes, night sweats, irritability, fatigue.  1.  Check CBC and TSH for  etiologies of fatigue 2.   Patient will get transvaginal ultrasound to evaluate ovaries.  3.  She needs screening colonoscopy.  This could also help determine etiology of abdominal bloating if ultrasound is negative. 4.  Considering hormone replacement therapy for symptoms.  We will also send a note to Dr. Charise Carwin about her GAD-7 score.

## 2017-06-06 ENCOUNTER — Ambulatory Visit (INDEPENDENT_AMBULATORY_CARE_PROVIDER_SITE_OTHER): Payer: BC Managed Care – PPO

## 2017-06-06 DIAGNOSIS — N854 Malposition of uterus: Secondary | ICD-10-CM | POA: Diagnosis not present

## 2017-06-06 DIAGNOSIS — R140 Abdominal distension (gaseous): Secondary | ICD-10-CM

## 2017-06-11 ENCOUNTER — Telehealth: Payer: Self-pay

## 2017-06-11 NOTE — Telephone Encounter (Addendum)
Spoke with pt and she is aware of this per Dr. Gala Romney. PT states she has an appt with Dr.Metheney and will take with her more and then have her do the referral for the GI doctor that she recommends.  ----- Message from Guss Bunde, MD sent at 06/11/2017  4:27 PM EDT ----- Regarding: RE: U/S results & plan Good News, the [place] is nml.   She has one small fibroid.  No ovarian masses.  There is not a gynecological cause for her bloating.  I suggest she goes to a GI MD for an appointment and a screening colonoscopy.    Lab results were addressed in the result note.  Can you please communicate with patient.

## 2017-06-20 ENCOUNTER — Ambulatory Visit: Payer: BC Managed Care – PPO | Admitting: Family Medicine

## 2017-06-20 ENCOUNTER — Encounter: Payer: Self-pay | Admitting: Family Medicine

## 2017-06-20 VITALS — BP 121/75 | HR 109 | Ht 69.02 in | Wt 182.0 lb

## 2017-06-20 DIAGNOSIS — R140 Abdominal distension (gaseous): Secondary | ICD-10-CM

## 2017-06-20 DIAGNOSIS — G4709 Other insomnia: Secondary | ICD-10-CM

## 2017-06-20 DIAGNOSIS — K5909 Other constipation: Secondary | ICD-10-CM

## 2017-06-20 DIAGNOSIS — Z1211 Encounter for screening for malignant neoplasm of colon: Secondary | ICD-10-CM

## 2017-06-20 DIAGNOSIS — F902 Attention-deficit hyperactivity disorder, combined type: Secondary | ICD-10-CM

## 2017-06-20 MED ORDER — AMPHETAMINE-DEXTROAMPHET ER 15 MG PO CP24: 15.0000 mg | ORAL_CAPSULE | ORAL | 0 refills | Status: DC

## 2017-06-20 MED ORDER — VALACYCLOVIR HCL 500 MG PO TABS: 500.0000 mg | ORAL_TABLET | Freq: Two times a day (BID) | ORAL | 6 refills | Status: DC

## 2017-06-20 MED ORDER — ESZOPICLONE 2 MG PO TABS: 2.0000 mg | ORAL_TABLET | Freq: Every evening | ORAL | 5 refills | Status: DC | PRN

## 2017-06-20 NOTE — Patient Instructions (Signed)
Can try miralax 1 capful at bedtime each night mixed with 6 ounces of water. Can adjust dose as needed Can try Align or Cuterelle probiotic.

## 2017-06-20 NOTE — Progress Notes (Signed)
Subjective:    CC: Sleep, ADHD  HPI: Chronic insomnia-she is currently on Lunesta 2 mg and is requesting refills today.  She is tolerating it well without any side effects or problems.  Follow-up ADHD-she is not currently on any type of stimulant medication but is interested in restarting it.  She would like to start it over the summer when she can see how she is doing.  Want to discuss with me that she is been having problems with her bowels over the last 3 to 6 months.  She is been more constipated occasionally passing pebble type stools.  And been getting a lot of bloating.  She says sometimes even drinking a cup of water will cause her to feel very bloated.  She did mention it to her GYN who did an work-up as well as a pelvic ultrasound and everything came back normal and recommended that she follow back up with me.  The constipation does flare her hemorrhoids but she has not noticed any blood in the stool.  Past medical history, Surgical history, Family history not pertinant except as noted below, Social history, Allergies, and medications have been entered into the medical record, reviewed, and corrections made.   Review of Systems: No fevers, chills, night sweats, weight loss, chest pain, or shortness of breath.   Objective:    General: Well Developed, well nourished, and in no acute distress.  Neuro: Alert and oriented x3, extra-ocular muscles intact, sensation grossly intact.  HEENT: Normocephalic, atraumatic  Skin: Warm and dry, no rashes. Cardiac: Regular rate and rhythm, no murmurs rubs or gallops, no lower extremity edema.  Respiratory: Clear to auscultation bilaterally. Not using accessory muscles, speaking in full sentences.   Impression and Recommendations:    ADHD -we discussed options.  Will restart Adderall XR which she has used in the past and did well with.  50 mg sent to pharmacy.  She can follow-up in 3 to 4 months if she is doing well.  If we need to make any  adjustments she can come in sooner.  Chronic insomnia -medication refilled today.  Bloating/constipation -we discussed options.  I do not think she has IBS.  We discussed trying MiraLAX daily and a probiotic.  She is due for screening colonoscopy based on her age so I do not get that updated.  If she is not improving with constipation then please let me know.  pH Q9 score of 6/answered no to the first 2 questions and gad 7 score of 5.  Her symptoms is somewhat difficult.  Need colon cancer screening.

## 2017-07-04 ENCOUNTER — Other Ambulatory Visit: Payer: Self-pay | Admitting: Family Medicine

## 2017-07-26 ENCOUNTER — Other Ambulatory Visit: Payer: Self-pay

## 2017-07-26 MED ORDER — AMPHETAMINE-DEXTROAMPHET ER 15 MG PO CP24: 15.0000 mg | ORAL_CAPSULE | ORAL | 0 refills | Status: DC

## 2017-07-26 NOTE — Telephone Encounter (Signed)
Tanza called and requested a refill on Adderall.

## 2017-08-22 ENCOUNTER — Encounter: Payer: Self-pay | Admitting: Family Medicine

## 2017-08-22 ENCOUNTER — Encounter: Payer: Self-pay | Admitting: Gastroenterology

## 2017-09-02 ENCOUNTER — Other Ambulatory Visit: Payer: Self-pay

## 2017-09-02 MED ORDER — AMPHETAMINE-DEXTROAMPHET ER 15 MG PO CP24: 15.0000 mg | ORAL_CAPSULE | ORAL | 0 refills | Status: DC

## 2017-09-02 NOTE — Telephone Encounter (Signed)
Lacey Jensen requests a refill on Adderall.

## 2017-10-14 ENCOUNTER — Ambulatory Visit (INDEPENDENT_AMBULATORY_CARE_PROVIDER_SITE_OTHER): Payer: BC Managed Care – PPO | Admitting: Sports Medicine

## 2017-10-14 ENCOUNTER — Encounter: Payer: Self-pay | Admitting: Sports Medicine

## 2017-10-14 DIAGNOSIS — J0101 Acute recurrent maxillary sinusitis: Secondary | ICD-10-CM | POA: Diagnosis not present

## 2017-10-14 MED ORDER — AMOXICILLIN-POT CLAVULANATE 875-125 MG PO TABS: 1.0000 | ORAL_TABLET | Freq: Two times a day (BID) | ORAL | 0 refills | Status: AC

## 2017-10-14 MED ORDER — FLUTICASONE PROPIONATE 50 MCG/ACT NA SUSP: NASAL | 3 refills | Status: DC

## 2017-10-14 NOTE — Assessment & Plan Note (Signed)
Augmentin, Flonase. Vocal rest. Return as needed.

## 2017-10-14 NOTE — Progress Notes (Signed)
Subjective:    CC: Feeling sick  HPI: For a week and a half this pleasant 52 year old female has had pain and pressure over her maxillary sinuses with radiation to the ears, sore throat, cough, hoarseness in the voice.  She is had multiple sinus infections in the past, she has been seen by ENT.  Symptoms are moderate, persistent.  I reviewed the past medical history, family history, social history, surgical history, and allergies today and no changes were needed.  Please see the problem list section below in epic for further details.  Past Medical History: Past Medical History:  Diagnosis Date  . Abnormal Pap smear    Age 86 and age 74  . Dysplasia of cervix    leep  . Headache(784.0)   . Immunotherapy    Past Surgical History: Past Surgical History:  Procedure Laterality Date  . A GYN surgery  2002  . btl     Social History: Social History   Socioeconomic History  . Marital status: Married    Spouse name: Gwyndolyn Saxon  . Number of children: 2  . Years of education: Not on file  . Highest education level: Not on file  Occupational History  . Occupation: Conservator, museum/gallery  . Financial resource strain: Not on file  . Food insecurity:    Worry: Not on file    Inability: Not on file  . Transportation needs:    Medical: Not on file    Non-medical: Not on file  Tobacco Use  . Smoking status: Former Smoker    Last attempt to quit: 01/23/2004    Years since quitting: 13.7  . Smokeless tobacco: Never Used  Substance and Sexual Activity  . Alcohol use: Yes    Alcohol/week: 1.0 standard drinks    Types: 1 drink(s) per week  . Drug use: No  . Sexual activity: Yes  Lifestyle  . Physical activity:    Days per week: Not on file    Minutes per session: Not on file  . Stress: Not on file  Relationships  . Social connections:    Talks on phone: Not on file    Gets together: Not on file    Attends religious service: Not on file    Active member of club or  organization: Not on file    Attends meetings of clubs or organizations: Not on file    Relationship status: Not on file  Other Topics Concern  . Not on file  Social History Narrative   She was adopted so we do not have an extensive family history. She does yoga for exercise and drinks 2 caffeinated drinks per day.   Family History: Family History  Problem Relation Age of Onset  . Migraines Son    Allergies: Allergies  Allergen Reactions  . Ambien [Zolpidem] Other (See Comments)    Sleep walking and memory loss  . Belsomra [Suvorexant] Other (See Comments)    Bad dreams, sleep walking  . Sumatriptan Succinate     REACTION: Made her feel high  . Zolmitriptan     REACTION: Made her feel high   Medications: See med rec.  Review of Systems: No fevers, chills, night sweats, weight loss, chest pain, or shortness of breath.   Objective:    General: Well Developed, well nourished, and in no acute distress.  Neuro: Alert and oriented x3, extra-ocular muscles intact, sensation grossly intact.  HEENT: Normocephalic, atraumatic, pupils equal round reactive to light, neck supple, no masses, no  lymphadenopathy, thyroid nonpalpable.  Oropharynx, nasopharynx, ear canals unremarkable with the exception of tenderness over the frontal and maxillary sinuses. Skin: Warm and dry, no rashes. Cardiac: Regular rate and rhythm, no murmurs rubs or gallops, no lower extremity edema.  Respiratory: Clear to auscultation bilaterally. Not using accessory muscles, speaking in full sentences.  Impression and Recommendations:    Acute recurrent maxillary sinusitis Augmentin, Flonase. Vocal rest. Return as needed. ___________________________________________ Gwen Her. Dianah Field, M.D., ABFM., CAQSM. Primary Care and [place] Instructor of [employer] [facility] [employer]

## 2017-10-18 ENCOUNTER — Telehealth: Payer: Self-pay | Admitting: *Deleted

## 2017-10-18 MED ORDER — BENZONATATE 200 MG PO CAPS: 200.0000 mg | ORAL_CAPSULE | Freq: Three times a day (TID) | ORAL | 0 refills | Status: DC | PRN

## 2017-10-18 NOTE — Telephone Encounter (Signed)
LMOM notifying pt of rx. 

## 2017-10-18 NOTE — Telephone Encounter (Signed)
No problem, sending in [employer]

## 2017-10-18 NOTE — Telephone Encounter (Signed)
Pt left vm stating that she saw you earlier this week for sinus/URI.  She stated that she is coughing pretty bad and wanted to know if you could send her something in for it.  Please advise.

## 2017-10-25 ENCOUNTER — Encounter: Payer: BC Managed Care – PPO | Admitting: Gastroenterology

## 2017-11-08 ENCOUNTER — Other Ambulatory Visit: Payer: Self-pay

## 2017-11-08 MED ORDER — AMPHETAMINE-DEXTROAMPHET ER 15 MG PO CP24: 15.0000 mg | ORAL_CAPSULE | ORAL | 0 refills | Status: DC

## 2017-11-08 NOTE — Telephone Encounter (Signed)
Lacey Jensen requests a refill on Adderall. She is past due for appointment. She did schedule a follow up on 11/28/2017.

## 2017-11-28 ENCOUNTER — Ambulatory Visit: Payer: BC Managed Care – PPO | Admitting: Family Medicine

## 2017-12-06 ENCOUNTER — Ambulatory Visit (INDEPENDENT_AMBULATORY_CARE_PROVIDER_SITE_OTHER): Payer: BC Managed Care – PPO | Admitting: Family Medicine

## 2017-12-06 ENCOUNTER — Encounter: Payer: Self-pay | Admitting: Family Medicine

## 2017-12-06 VITALS — BP 123/77 | HR 118 | Ht 69.0 in | Wt 163.0 lb

## 2017-12-06 DIAGNOSIS — K645 Perianal venous thrombosis: Secondary | ICD-10-CM | POA: Diagnosis not present

## 2017-12-06 DIAGNOSIS — F902 Attention-deficit hyperactivity disorder, combined type: Secondary | ICD-10-CM | POA: Diagnosis not present

## 2017-12-06 DIAGNOSIS — G4709 Other insomnia: Secondary | ICD-10-CM | POA: Diagnosis not present

## 2017-12-06 MED ORDER — AMPHETAMINE-DEXTROAMPHET ER 15 MG PO CP24: 15.0000 mg | ORAL_CAPSULE | ORAL | 0 refills | Status: DC

## 2017-12-06 MED ORDER — HYDROCODONE-ACETAMINOPHEN 5-325 MG PO TABS: 1.0000 | ORAL_TABLET | Freq: Four times a day (QID) | ORAL | 0 refills | Status: DC | PRN

## 2017-12-06 MED ORDER — HYDROCORTISONE 2.5 % RE CREA: 1.0000 "application " | TOPICAL_CREAM | Freq: Two times a day (BID) | RECTAL | 0 refills | Status: DC

## 2017-12-06 NOTE — Progress Notes (Signed)
Subjective:    CC:   HPI:  52 year old female is here today for follow-up anxiety-she is currently on fluoxetine 40 mg total daily and is doing well on her current regimen.  No concerns.  ADD - Reports symptoms are well controlled on current regime. Denies any problems with insomnia, chest pain, palpitations, or SOB.  Actually just restarted medication in May and she is been doing great on it.  No problems or side effects no chest pain.  Also concerned about her hemorrhoids they have been flaring for about a week she is been doing sitz baths and applying witch hazel. Painful to sit down.  In fact last night she says that it was so painful it actually woke her up out of her sleep.  She went to the bathroom and it was actually actively bleeding.  She said with the bleeding some of the swelling went down but now is a day has progressed the swelling has started again.  Grabs the pain is severe and says she really cannot even sit down comfortably.  Also been trying to increase her water intake.   Past medical history, Surgical history, Family history not pertinant except as noted below, Social history, Allergies, and medications have been entered into the medical record, reviewed, and corrections made.   Review of Systems: No fevers, chills, night sweats, weight loss, chest pain, or shortness of breath.   Objective:    General: Well Developed, well nourished, and in no acute distress.  Neuro: Alert and oriented x3, extra-ocular muscles intact, sensation grossly intact.  HEENT: Normocephalic, atraumatic  Skin: Warm and dry, no rashes. Cardiac: Regular rate and rhythm, no murmurs rubs or gallops, no lower extremity edema.  Respiratory: Clear to auscultation bilaterally. Not using accessory muscles, speaking in full sentences. Rectal: He has a large approximately 2 x 3 cm swollen and firm hemorrhoid at 9 o'clock position.  She has another hemorrhoid at the 9 o'clock position that is swollen and  indurated but not as firm.  Dermal sphincter tone.  She has active bleeding from the hemorrhoid at the 9 o'clock position.  Impression and Recommendations:    Anxiety-continue current regimen with fluoxetine.  ADD -doing well on her regimen with Adderall.  90-day prescription sent to pharmacy.  Aloe up in 3 to 4 months.  Thrombosed hemorrhoid-discussed options.  Recommended incision and drainage.  Patient tolerated procedure well as below.  Incision Thrombosed Hemorrhoid Procedure Note  Pre-operative Diagnosis: Thrombosed external hemorrhoid  Post-operative Diagnosis: Thrombosed external hemorrhoid  Locations: At the 9 o'clock position.  Indications: Painful external hemorrhoid. Explained surgical vs conservative treatment; surgical incision and drainage of clot usually works quickly to reduce pain and shorten healing time, but is uncomfortable to perform.  After discussion, the patient wishes to proceed with this procedure.  Anesthesia: Lidocaine 1% without epinephrine without added sodium bicarbonate  Procedure Details  After adequate anesthesia, an elliptical incision was made, and the visible clots ( 6 of them) were extruded with forceps. This was well tolerated. Bulky dressing is applied.  Complications: none.  Plan: 1. Very frequent Sitz baths. 2. Recommend to soften stools.  3. Call or return to clinic prn if these symptoms worsen or fail to improve as anticipated

## 2017-12-09 ENCOUNTER — Ambulatory Visit (INDEPENDENT_AMBULATORY_CARE_PROVIDER_SITE_OTHER): Payer: BC Managed Care – PPO | Admitting: Family Medicine

## 2017-12-09 ENCOUNTER — Encounter: Payer: Self-pay | Admitting: Family Medicine

## 2017-12-09 VITALS — BP 130/80 | HR 110 | Ht 68.9 in | Wt 164.0 lb

## 2017-12-09 DIAGNOSIS — K645 Perianal venous thrombosis: Secondary | ICD-10-CM

## 2017-12-09 MED ORDER — SULFAMETHOXAZOLE-TRIMETHOPRIM 800-160 MG PO TABS: 1.0000 | ORAL_TABLET | Freq: Two times a day (BID) | ORAL | 0 refills | Status: DC

## 2017-12-09 NOTE — Progress Notes (Signed)
   Subjective:    Patient ID: Lacey Jensen, female    DOB: 01-Mar-1965, 52 y.o.   MRN: [id number]  HPI 52 yo female returns today after hemorrhoidectomy on Friday. She isn't feeling much better.  Stools are still not completely soft.  No BM in the last day or so.  She has been doing her sitz baths 3 times a day.  Still uncomfortable.     Review of Systems     Objective:   Physical Exam  Constitutional: She is oriented to person, place, and time. She appears well-developed and well-nourished.  HENT:  Head: Normocephalic and atraumatic.  Eyes: Conjunctivae and EOM are normal.  Cardiovascular: Normal rate.  Pulmonary/Chest: Effort normal.  Genitourinary:  Genitourinary Comments: The hemorrhoid that we lanced on Friday is much smaller than it was.  It is still just a little bit indurated though.  At the 3 o'clock position though she has another hemorrhoid that was keeping an eye on it some most double the size that it was on Friday.  There are a few thrombosed areas and then part of the lesion just looks indurated and thick and swollen.  Neurological: She is alert and oriented to person, place, and time.  Skin: Skin is dry. No pallor.  Psychiatric: She has a normal mood and affect. Her behavior is normal.  Vitals reviewed.     Assessment & Plan:  Incision Thrombosed Hemorrhoid Procedure Note  Pre-operative Diagnosis: Thrombosed external hemorrhoid  Post-operative Diagnosis: Thrombosed external hemorrhoid  Locations:3 o'clock hemorrhoid at rectum.    Indications: Painful external hemorrhoid. Explained surgical vs conservative treatment; surgical incision and drainage of clot usually works quickly to reduce pain and shorten healing time, but is uncomfortable to perform.  After discussion, the patient wishes to proceed with this procedure.  Anesthesia: Lidocaine 1% without epinephrine without added sodium bicarbonate  Procedure Details  After adequate anesthesia, an elliptical  incision was made, and the visible clot was extruded with curved hemostat. This was well tolerated. Bulky dressing is applied.  Complications: none.  Plan: 1. Very frequent Sitz baths. 2. Colace tid prn and increase fluids to prevent constipation.   3. Call or return to clinic prn if these symptoms worsen or fail to improve as anticipated  She did feel lightheaded after the procedure so we had her lay down to give her some ice water and some crackers.  She did not pass out.

## 2017-12-11 ENCOUNTER — Telehealth: Payer: Self-pay

## 2017-12-11 DIAGNOSIS — K645 Perianal venous thrombosis: Secondary | ICD-10-CM

## 2017-12-11 NOTE — Telephone Encounter (Signed)
Pt called with status update- states she is not feeling worse but also does not feel any better. States she was asked to call Dr Madilyn Fireman with status update  FYI to Dr Madilyn Fireman

## 2017-12-12 NOTE — Telephone Encounter (Signed)
Updated work note printed and placed upfront. Left VM for Pt with status update.

## 2017-12-12 NOTE — Telephone Encounter (Signed)
Absolutely okay to provide work note.  Okay to write her out for the rest of this week if she needs to.  Also referral placed to surgery for consultation.  Hopefully they have some opening available soon.

## 2017-12-12 NOTE — Telephone Encounter (Signed)
Referral placed.

## 2017-12-12 NOTE — Telephone Encounter (Signed)
Lacey Jensen called back and left a message stating she would like a referral for the hemorrhoids. She also would like a work note.

## 2017-12-13 ENCOUNTER — Telehealth: Payer: Self-pay | Admitting: Family Medicine

## 2017-12-13 NOTE — Telephone Encounter (Signed)
Spoke with her.  Would ask to place referral/[facility] tomorrow for consultation.  We are going to bump that referral up to urgent.  She still quite uncomfortable she really does not feel like it is getting a lot better and the hemorrhoid is still protruding.  She is not able to really get it to go back in.

## 2018-01-08 ENCOUNTER — Other Ambulatory Visit: Payer: Self-pay | Admitting: *Deleted

## 2018-01-08 ENCOUNTER — Other Ambulatory Visit: Payer: Self-pay | Admitting: Family Medicine

## 2018-01-16 ENCOUNTER — Other Ambulatory Visit: Payer: Self-pay

## 2018-01-30 ENCOUNTER — Telehealth: Payer: Self-pay

## 2018-01-30 NOTE — Telephone Encounter (Signed)
Mailed letter written 12/12/17 to home address

## 2018-04-14 ENCOUNTER — Ambulatory Visit (INDEPENDENT_AMBULATORY_CARE_PROVIDER_SITE_OTHER): Payer: BC Managed Care – PPO | Admitting: Physician Assistant

## 2018-04-14 ENCOUNTER — Encounter: Payer: Self-pay | Admitting: Physician Assistant

## 2018-04-14 ENCOUNTER — Other Ambulatory Visit: Payer: Self-pay

## 2018-04-14 VITALS — BP 118/75 | HR 88 | Temp 98.7°F

## 2018-04-14 DIAGNOSIS — J0101 Acute recurrent maxillary sinusitis: Secondary | ICD-10-CM | POA: Diagnosis not present

## 2018-04-14 MED ORDER — AMOXICILLIN-POT CLAVULANATE 875-125 MG PO TABS: 1.0000 | ORAL_TABLET | Freq: Two times a day (BID) | ORAL | 0 refills | Status: DC

## 2018-04-14 NOTE — Progress Notes (Signed)
..  Virtual Visit via Telephone Note  I connected with Lacey Jensen on 04/14/18 at  3:20 PM EDT by telephone and verified that I am speaking with the correct person using two identifiers.   I discussed the limitations, risks, security and privacy concerns of performing an evaluation and management service by telephone and the availability of in person appointments. I also discussed with the patient that there may be a patient responsible charge related to this service. The patient expressed understanding and agreed to proceed.    History of Present Illness: Pt is a 53 yo female with hx of sinus infections who is having 2-3 weeks of sinus pressure, congestion, headaches and facial pain. Last sinus infection was 09/2017. She has tried allergy medication, sudafed, mucinex with little relief. She called tele doc in January and was given astelin nasal spray. She has had no relief. No fever, chills, body aches, SOB, wheezing, cough. No travel. No sick contacts.   She has questions about contact with her pregnant daughter during covid.    Observations/Objective: No acute distress.  .. Today's Vitals   04/14/18 1514  BP: 118/75  Pulse: 88  Temp: 98.7 F (37.1 C)   There is no height or weight on file to calculate BMI.   Assessment and Plan: Marland KitchenMarland KitchenDiagnoses and all orders for this visit:  Acute recurrent maxillary sinusitis -     amoxicillin-clavulanate (AUGMENTIN) 875-125 MG tablet; Take 1 tablet by mouth 2 (two) times daily.   Pt has hx of sinus infections. Last treated 09/2017. Pt symptomatic for 2-3 weeks. Treated with augmentin. 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 Written out for 2 days until Thursday. Discussed other symptomatic care. Stop Astelin and start flonase.  Follow up as needed.   Discussed COVID. She does need to limit her contact with immunocompromised daughter. Use caution and when she does see her use good hand hygiene.    Follow Up Instructions:    I discussed the assessment and treatment plan with the patient. The patient was provided an opportunity to ask questions and all were answered. The patient agreed with the plan and demonstrated an understanding of the instructions.   The patient was advised to call back or seek an in-person evaluation if the symptoms worsen or if the condition fails to improve as anticipated.  I provided 20 minutes of non-face-to-face time during this encounter.   Iran Planas, PA-C

## 2018-05-22 ENCOUNTER — Telehealth (INDEPENDENT_AMBULATORY_CARE_PROVIDER_SITE_OTHER): Payer: BC Managed Care – PPO | Admitting: Family Medicine

## 2018-05-22 ENCOUNTER — Encounter: Payer: Self-pay | Admitting: Family Medicine

## 2018-05-22 VITALS — BP 115/90 | Ht 68.9 in | Wt 164.0 lb

## 2018-05-22 DIAGNOSIS — G43011 Migraine without aura, intractable, with status migrainosus: Secondary | ICD-10-CM | POA: Diagnosis not present

## 2018-05-22 DIAGNOSIS — F902 Attention-deficit hyperactivity disorder, combined type: Secondary | ICD-10-CM | POA: Diagnosis not present

## 2018-05-22 MED ORDER — AMPHETAMINE-DEXTROAMPHET ER 15 MG PO CP24: 15.0000 mg | ORAL_CAPSULE | ORAL | 0 refills | Status: DC

## 2018-05-22 MED ORDER — CYCLOBENZAPRINE HCL 10 MG PO TABS: 10.0000 mg | ORAL_TABLET | Freq: Three times a day (TID) | ORAL | 2 refills | Status: DC | PRN

## 2018-05-22 MED ORDER — GALCANEZUMAB-GNLM 120 MG/ML ~~LOC~~ SOAJ: 240.0000 mg | Freq: Once | SUBCUTANEOUS | 3 refills | Status: AC

## 2018-05-22 NOTE — Progress Notes (Signed)
Would like a referral to a headache specialists she reports that she has ran out of the [place] and the previous provider has moved to a location in [place]. She also asked about getting a refill on  Cyclobenzaprine 10 mg in addition to the migraine  medication if ok.Audelia Hives [place]

## 2018-05-22 NOTE — Progress Notes (Signed)
Virtual Visit via Telephone Note  I attempted to connected with Lacey Jensen on 05/22/18 at  3:20 PM EDT via video enabled telemedicine application but she was unable to get it to access her camera so we transitioned to a telephone call.  By application and verified that I am speaking with the correct person using two identifiers.   I discussed the limitations of evaluation and management by telemedicine and the availability of in person appointments. The patient expressed understanding and agreed to proceed.  Pt was at home and I was in my office for the virtual visit.     Subjective:    CC: F/U ADD  HPI: ADHD - Reports symptoms are well controlled on current regime. Denies any problems with insomnia, chest pain, palpitations, or SOB.  BP was great today!    Would like a referral to a headache specialists she reports that she has ran out of the [place] and the previous provider has moved to a location in [place]. She also asked about getting a refill on  Cyclobenzaprine 10 mg in addition to the migraine  medication if ok. She felt it worked well for her.  She really wants to try botox injection. Has done well with trigger point injection. Gets a lot of neck pain that is aggrevating her headaches.  She was previously seen by Karoline Caldwell at [facility] for her migraines.  She felt like her migraines got significantly worse when she started to go into menopause.  She was using Topamax when she first started seeing her but did not feel like it was providing any significant benefit. At that point they switched her to [place] back in October 2018.  She said she was on it for almost a year and did feel like it was helpful.   Past medical history, Surgical history, Family history not pertinant except as noted below, Social history, Allergies, and medications have been entered into the medical record, reviewed, and corrections made.   Review of Systems: No fevers, chills, night sweats, weight loss,  chest pain, or shortness of breath.   Objective:    General: Speaking clearly in complete sentences without any shortness of breath.  Alert and oriented x3.  Normal judgment. No apparent acute distress.    Impression and Recommendations:    ADHD - doing well overall.  Will refill medications for 90 days.  She is asymptomatic and blood pressure is well controlled.F/U in 4-6 months.    Migraine headaches -being that she has a lot of neck pain and tension that often triggers her migraines actually think she would be a great candidate for Botox.  This had actually been discussed previously in the past.  She did improve while on the [place] for a year but has been off for quite some time as her provider moved to the [place] area.  Will make referral for Botox.  She is willing to try a JB again or another medication in that class which ever is covered by her insurance but would also like consultation for Botox.  She is responded really well to trigger point injections before.  She says she feels like working from home has really been exacerbating her headaches and her neck pain.  I did go ahead and refill her Flexeril today.      I discussed the assessment and treatment plan with the patient. The patient was provided an opportunity to ask questions and all were answered. The patient agreed with the plan and demonstrated  an understanding of the instructions.   The patient was advised to call back or seek an in-person evaluation if the symptoms worsen or if the condition fails to improve as anticipated.   Time spent 20 min, 15 spent indirect contact on the phone.   Beatrice Lecher, MD

## 2018-06-04 ENCOUNTER — Other Ambulatory Visit: Payer: Self-pay

## 2018-06-04 ENCOUNTER — Telehealth: Payer: Self-pay | Admitting: *Deleted

## 2018-06-04 ENCOUNTER — Encounter: Payer: Self-pay | Admitting: Neurology

## 2018-06-04 ENCOUNTER — Ambulatory Visit (INDEPENDENT_AMBULATORY_CARE_PROVIDER_SITE_OTHER): Payer: BC Managed Care – PPO | Admitting: Neurology

## 2018-06-04 VITALS — BP 122/78 | HR 84 | Temp 98.5°F | Ht 69.5 in | Wt 169.0 lb

## 2018-06-04 DIAGNOSIS — M7918 Myalgia, other site: Secondary | ICD-10-CM | POA: Diagnosis not present

## 2018-06-04 DIAGNOSIS — G441 Vascular headache, not elsewhere classified: Secondary | ICD-10-CM | POA: Diagnosis not present

## 2018-06-04 DIAGNOSIS — H5462 Unqualified visual loss, left eye, normal vision right eye: Secondary | ICD-10-CM

## 2018-06-04 DIAGNOSIS — M542 Cervicalgia: Secondary | ICD-10-CM

## 2018-06-04 DIAGNOSIS — R51 Headache with orthostatic component, not elsewhere classified: Secondary | ICD-10-CM

## 2018-06-04 DIAGNOSIS — G43711 Chronic migraine without aura, intractable, with status migrainosus: Secondary | ICD-10-CM

## 2018-06-04 DIAGNOSIS — R519 Headache, unspecified: Secondary | ICD-10-CM

## 2018-06-04 MED ORDER — ATENOLOL 25 MG PO TABS: 25.0000 mg | ORAL_TABLET | Freq: Every day | ORAL | 11 refills | Status: DC

## 2018-06-04 MED ORDER — METOCLOPRAMIDE HCL 10 MG PO TABS: 10.0000 mg | ORAL_TABLET | Freq: Four times a day (QID) | ORAL | 4 refills | Status: DC | PRN

## 2018-06-04 NOTE — Progress Notes (Signed)
[facility]    Provider:  Dr Jaynee Eagles Requesting Provider: Hali Marry, * Primary Care Provider:  Hali Marry, MD  CC:  Migraines  HPI:  Lacey Jensen is a 53 y.o. female here as requested by Hali Marry, * for migraines. Started in her mid 62s. She has daily headaches for years. She wakes with headaches. She goes to bed with a headache. Ajovy helped but not tremendously. She did the Ajovy for a year. The migraines start on the left side behind the eye, feeling like pulsating, pounding and throbbing, she was in foster care so unsure of her family history. Sleep helps. She can't function with a migraine. It makes her leave work. Movement makes it worse. She has photo/phonophobia, light and sound sensitivity. She had a great response to nerve blocks and trigger point for acute management in the past. 15 migraine days a month for > 1 year, that are moderately severe or severe and impact life, a migraine can last 24-72 hours, no aura, no medication overuse.. Migraines can be positional, she can;t concentrate, vision loss in the left eye. No other focal neurologic deficits, associated symptoms, inciting events or modifiable factors.  Tried : Topiramate, Ajovy for a year, nortriptyline, amitriptyline, sumatriptan made her feel weird. Flexeril, prozac.    Reviewed notes, labs and imaging from outside physicians, which showed:   Reviewed notes from [facility] and Dr. Madilyn Fireman.  Patient has a history of sinus infections and was last seen in April of this year having 2 to 3 weeks of sinus pressure, congestion, headaches and facial pain.  Last sinus infection was September 2019.  Treated with allergy medication, Sudafed, Mucinex with little relief, Astelin nasal spray, at that time she was treated with Augmentin.  Also started on Flonase.  She also has ADHD controlled well.  Patient has a history of migraines on adjuvantly her previous provider moved to a  location in [place].  She also takes Flexeril for her migraines.  Felt it worked well for her.  She really wants to try Botox injections, has done well with trigger point injections in the past, gets a lot of neck pain that is aggravating her headaches.  She was previously seen by Karoline Caldwell at [facility] for her migraines.  Her migraines became significantly worse after menopause.  She was using Topamax which did not help.  She was switched back to ajovy and felt it helped.  Dr. Charise Carwin thought that she would be a great candidate for Botox.  She is responded very well to trigger point injections.  Review of Systems: Patient complains of symptoms per HPI as well as the following symptoms Migraines,vision changes, fatigue. Pertinent negatives and positives per HPI. All others negative.   Social History   Socioeconomic History  . Marital status: Married    Spouse name: Gwyndolyn Saxon  . Number of children: 2  . Years of education: Not on file  . Highest education level: Master's degree (e.g., MA, MS, MEng, MEd, MSW, MBA)  Occupational History  . Occupation: Conservator, museum/gallery  . Financial resource strain: Not on file  . Food insecurity:    Worry: Not on file    Inability: Not on file  . Transportation needs:    Medical: Not on file    Non-medical: Not on file  Tobacco Use  . Smoking status: Former Smoker    Last attempt to quit: 01/23/2004    Years since quitting: 14.3  . Smokeless  tobacco: Never Used  Substance and Sexual Activity  . Alcohol use: Yes    Alcohol/week: 1.0 standard drinks    Types: 1 Standard drinks or equivalent per week  . Drug use: No  . Sexual activity: Yes    Birth control/protection: None    Comment: IN MEN[place]  Lifestyle  . Physical activity:    Days per week: Not on file    Minutes per session: Not on file  . Stress: Not on file  Relationships  . Social connections:    Talks on phone: Not on file    Gets together: Not on file    Attends  religious service: Not on file    Active member of club or organization: Not on file    Attends meetings of clubs or organizations: Not on file    Relationship status: Not on file  . Intimate partner violence:    Fear of current or ex partner: Not on file    Emotionally abused: Not on file    Physically abused: Not on file    Forced sexual activity: Not on file  Other Topics Concern  . Not on file  Social History Narrative   She was adopted so we do not have an extensive family history. She does yoga for exercise and drinks 2 caffeinated drinks per day.      Lives at home with her husband & 3 dogs   Right handed    Family History  Problem Relation Age of Onset  . Migraines Son     Past Medical History:  Diagnosis Date  . Abnormal Pap smear    Age 40 and age 59  . Dysplasia of cervix    leep  . Headache(784.0)   . Immunotherapy     Patient Active Problem List   Diagnosis Date Noted  . Anxiety 06/05/2017  . Insomnia 12/02/2013  . History of abnormal Pap smear 06/18/2012  . Migraine without aura 09/08/2009  . ALLERGIC RHINITIS 04/25/2009  . Attention deficit hyperactivity disorder (ADHD) 06/25/2008    Past Surgical History:  Procedure Laterality Date  . A GYN surgery  2002  . btl      Current Outpatient Medications  Medication Sig Dispense Refill  . [START ON 07/20/2018] amphetamine-dextroamphetamine (ADDERALL XR) 15 MG 24 hr capsule Take 1 capsule by mouth every morning. 30 capsule 0  . [START ON 06/20/2018] amphetamine-dextroamphetamine (ADDERALL XR) 15 MG 24 hr capsule Take 1 capsule by mouth every morning. 30 capsule 0  . amphetamine-dextroamphetamine (ADDERALL XR) 15 MG 24 hr capsule Take 1 capsule by mouth every morning. 30 capsule 0  . azelastine (ASTELIN) 0.1 % nasal spray U 1 SPR IEN BID    . cyclobenzaprine (FLEXERIL) 10 MG tablet Take 1 tablet (10 mg total) by mouth every 8 (eight) hours as needed for muscle spasms. 40 tablet 2  . FIBER PO Take by mouth.     Marland Kitchen FLUoxetine (PROZAC) 20 MG tablet TAKE 2 TABLETS(40 MG) BY MOUTH DAILY 180 tablet 1  . MAGNESIUM PO Take by mouth.    . Probiotic Product (PROBIOTIC PO) Take by mouth.    Marland Kitchen atenolol (TENORMIN) 25 MG tablet Take 1 tablet (25 mg total) by mouth daily. 30 tablet 11  . metoCLOPramide (REGLAN) 10 MG tablet Take 1 tablet (10 mg total) by mouth every 6 (six) hours as needed for nausea. Or for migraines. 90 tablet 4  . valACYclovir (VALTREX) 500 MG tablet Take 1 tablet (500 mg total) by mouth  2 (two) times daily. (Patient not taking: Reported on 06/04/2018) 14 tablet 6   No current facility-administered medications for this visit.     Allergies as of 06/04/2018 - Review Complete 06/04/2018  Allergen Reaction Noted  . Ambien [zolpidem] Other (See Comments) 05/22/2016  . Belsomra [suvorexant] Other (See Comments) 06/08/2016  . Sumatriptan succinate  04/04/2010  . Zolmitriptan  04/04/2010    Vitals: BP 122/78 (BP Location: Right Arm, Patient Position: Sitting)   Pulse 84   Temp 98.5 F (36.9 C) (Temporal)   Ht 5' 9.5" (1.765 m)   Wt 169 lb (76.7 kg)   LMP 11/23/2015   BMI 24.60 kg/m  Last Weight:  Wt Readings from Last 1 Encounters:  06/04/18 169 lb (76.7 kg)   Last Height:   Ht Readings from Last 1 Encounters:  06/04/18 5' 9.5" (1.765 m)     Physical exam: Exam: Gen: NAD, conversant, well nourised, obese, well groomed                     CV: RRR, no MRG. No Carotid Bruits. No peripheral edema, warm, nontender Eyes: Conjunctivae clear without exudates or hemorrhage  Neuro: Detailed Neurologic Exam  Speech:    Speech is normal; fluent and spontaneous with normal comprehension.  Cognition:    The patient is oriented to person, place, and time;     recent and remote memory intact;     language fluent;     normal attention, concentration,     fund of knowledge Cranial Nerves:    The pupils are equal, round, and reactive to light. The fundi are normal and spontaneous venous  pulsations are present. Visual fields are full to finger confrontation. Extraocular movements are intact. Trigeminal sensation is intact and the muscles of mastication are normal. The face is symmetric. The palate elevates in the midline. Hearing intact. Voice is normal. Shoulder shrug is normal. The tongue has normal motion without fasciculations.   Coordination:    Normal finger to nose and heel to shin. Normal rapid alternating movements.   Gait:    Heel-toe and tandem gait are normal.   Motor Observation:    No asymmetry, no atrophy, and no involuntary movements noted. Tone:    Normal muscle tone.    Posture:    Posture is normal. normal erect    Strength:    Strength is V/V in the upper and lower limbs.      Sensation: intact to LT     Reflex Exam:  DTR's:    Deep tendon reflexes in the upper and lower extremities are normal bilaterally.   Toes:    The toes are downgoing bilaterally.   Clonus:    Clonus is absent.    Assessment/Plan:  53 year old with intractable chronic migraines without aura. Given concerning symptoms needs a thorough examination.  - MRI brain due to concerning symptoms of morning headaches, positional headaches,vision changes  to look for space occupying mass, chiari or intracranial hypertension (pseudotumor).  - Dry Needling at [facility] for cervical myofascial pain syndrome contributing to neck pain, cervicalgia and migraines.  - Botox approval - NOT on emgality, failed Ajovy (tried for a year). Also failed multiple classes of medications. Topiramate, Ajovy for a year, nortriptyline, amitriptyline, sumatriptan made her feel weird. Flexeril, prozac.  On atenolol. (If denied can try samples for the first injection an dtry for approval again if needed)  - Labs today   - try Regan for acute management. May  introduce triptans at a later time (sumatriptan made her feel odd, will try something else) when migraine frequency improved. For the next 1-2  weeks take Reglan(metoclopramide) up to 4x a day for migraines or nausea and after that as needed   Meds ordered this encounter  Medications  . atenolol (TENORMIN) 25 MG tablet    Sig: Take 1 tablet (25 mg total) by mouth daily.    Dispense:  30 tablet    Refill:  11  . metoCLOPramide (REGLAN) 10 MG tablet    Sig: Take 1 tablet (10 mg total) by mouth every 6 (six) hours as needed for nausea. Or for migraines.    Dispense:  90 tablet    Refill:  4    Orders Placed This Encounter  Procedures  . MR BRAIN W WO CONTRAST  . TSH  . CBC  . Comprehensive metabolic panel  . Ambulatory referral to Physical Therapy   Meds ordered this encounter  Medications  . atenolol (TENORMIN) 25 MG tablet    Sig: Take 1 tablet (25 mg total) by mouth daily.    Dispense:  30 tablet    Refill:  11  . metoCLOPramide (REGLAN) 10 MG tablet    Sig: Take 1 tablet (10 mg total) by mouth every 6 (six) hours as needed for nausea. Or for migraines.    Dispense:  90 tablet    Refill:  4   Discussed: To prevent or relieve headaches, try the following: Cool Compress. Lie down and place a cool compress on your head.  Avoid headache triggers. If certain foods or odors seem to have triggered your migraines in the past, avoid them. A headache diary might help you identify triggers.  Include physical activity in your daily routine. Try a daily walk or other moderate aerobic exercise.  Manage stress. Find healthy ways to cope with the stressors, such as delegating tasks on your to-do list.  Practice relaxation techniques. Try deep breathing, yoga, massage and visualization.  Eat regularly. Eating regularly scheduled meals and maintaining a healthy diet might help prevent headaches. Also, drink plenty of fluids.  Follow a regular sleep schedule. Sleep deprivation might contribute to headaches Consider biofeedback. With this mind-body technique, you learn to control certain bodily functions - such as muscle tension, heart  rate and blood pressure - to prevent headaches or reduce headache pain.    Proceed to emergency room if you experience new or worsening symptoms or symptoms do not resolve, if you have new neurologic symptoms or if headache is severe, or for any concerning symptom.   Provided education and documentation from American headache Society toolbox including articles on: chronic migraine medication overuse headache, chronic migraines, prevention of migraines, behavioral and other nonpharmacologic treatments for headache.  Cc: Hali Marry, *,  Hali Marry, MD  Sarina Ill, MD  [facility] [address] [place], [place] [address]  Phone [phone] Fax [phone]

## 2018-06-04 NOTE — Telephone Encounter (Signed)
Botox injection charge sheet completed, ready for MD signature.

## 2018-06-04 NOTE — Telephone Encounter (Signed)
-----   Message from Melvenia Beam, MD sent at 06/04/2018 12:35 PM EDT ----- Regarding: Please initiate botox approval for patient. Please initiate botox approval for patient. [id number]. thanks

## 2018-06-04 NOTE — Patient Instructions (Addendum)
MRI brain w/wo contrast Bloodwork Start Botox approval process Start Atenolol daily Mayer Masker the next 1-2 weeks take Reglan(metoclopramide) up to 4x a day for migraines or nausea and after that as needed Dry Needling Atenolol tablets What is this medicine? ATENOLOL (a TEN oh lole) is a beta-blocker. Beta-blockers reduce the workload on the heart and help it to beat more regularly. This medicine is used to treat high blood pressure and to prevent chest pain. It is also used to protect the heart during a heart attack and to prevent an additional heart attack from occurring. This medicine may be used for other purposes; ask your health care provider or pharmacist if you have questions. COMMON BRAND NAME(S): Tenormin What should I tell my health care provider before I take this medicine? They need to know if you have any of these conditions: -diabetes -heart or vessel disease like slow heart rate, worsening heart failure, heart block, sick sinus syndrome or Raynaud's disease -kidney disease -lung or breathing disease, like asthma or emphysema -pheochromocytoma -thyroid disease -an unusual or allergic reaction to atenolol, other beta-blockers, medicines, foods, dyes, or preservatives -pregnant or trying to get pregnant -breast-feeding How should I use this medicine? Take this medicine by mouth with a drink of water. Follow the directions on the prescription label. This medicine may be taken with or without food. Take your medicine at regular intervals. Do not take more medicine than directed. Do not stop taking this medicine suddenly. This could lead to serious heart-related effects. Talk to your pediatrician regarding the use of this medicine in children. Special care may be needed. Overdosage: If you think you have taken too much of this medicine contact a poison control center or emergency room at once. NOTE: This medicine is only for you. Do not share this medicine with others. What if I miss a  dose? If you miss a dose, take it as soon as you can. If it is almost time for your next dose, take only that dose. Do not take double or extra doses. What may interact with this medicine? This medicine may interact with the following medications: -certain medicines for blood pressure, heart disease, irregular heart beat -clonidine -digoxin -diuretics -dobutamine -epinephrine -isoproterenol -NSAIDs, medicines for pain and inflammation, like ibuprofen or naproxen -reserpine This list may not describe all possible interactions. Give your health care provider a list of all the medicines, herbs, non-prescription drugs, or dietary supplements you use. Also tell them if you smoke, drink alcohol, or use illegal drugs. Some items may interact with your medicine. What should I watch for while using this medicine? Visit your doctor or health care professional for regular check ups. Check your blood pressure and pulse rate regularly. Ask your health care professional what your blood pressure and pulse rate should be, and when you should contact him or her. You may get drowsy or dizzy. Do not drive, use machinery, or do anything that needs mental alertness until you know how this medicine affects you. Do not stand or sit up quickly. Alcohol may interfere with the effect of this medicine. Avoid alcoholic drinks. This medicine can affect blood sugar levels. If you have diabetes, check with your doctor or health care professional before you change your diet or the dose of your diabetic medicine. Do not treat yourself for coughs, colds, or pain while you are taking this medicine without asking your doctor or health care professional for advice. Some ingredients may increase your blood pressure. What side effects may I  notice from receiving this medicine? Side effects that you should report to your doctor or health care professional as soon as possible: -allergic reactions like skin rash, itching or hives,  swelling of the face, lips, or tongue -breathing problems -changes in vision -chest pain -cold, tingling, or numb hands or feet -depression -fast, irregular heartbeat -feeling faint or lightheaded, falls -fever with sore throat -rapid weight gain -swollen ankles, legs Side effects that usually do not require medical attention (report to your doctor or health care professional if they continue or are bothersome): -anxiety, nervous -diarrhea -dry skin -change in sex drive or performance -headache -nightmares or trouble sleeping -short term memory loss -stomach upset -unusually tired This list may not describe all possible side effects. Call your doctor for medical advice about side effects. You may report side effects to FDA at 1-800-FDA-1088. Where should I keep my medicine? Keep out of the reach of children. Store at room temperature between 20 and 25 degrees C (68 and 77 degrees F). Close tightly and protect from light. Throw away any unused medicine after the expiration date. NOTE: This sheet is a summary. It may not cover all possible information. If you have questions about this medicine, talk to your doctor, pharmacist, or health care provider.  2019 Elsevier/Gold Standard (2012-09-14 14:21:28)  Metoclopramide tablets What is this medicine? METOCLOPRAMIDE (met oh kloe PRA mide) is used to treat the symptoms of gastroesophageal reflux disease (GERD) like heartburn. It is also used to treat people with slow emptying of the stomach and intestinal tract. This medicine may be used for other purposes; ask your health care provider or pharmacist if you have questions. COMMON BRAND NAME(S): Reglan What should I tell my health care provider before I take this medicine? They need to know if you have any of these conditions: -breast cancer -depression -diabetes -heart failure -high blood pressure -kidney disease -liver disease -Parkinson's disease or a movement  disorder -pheochromocytoma -seizures -stomach obstruction, bleeding, or perforation -an unusual or allergic reaction to metoclopramide, procainamide, sulfites, other medicines, foods, dyes, or preservatives -pregnant or trying to get pregnant -breast-feeding How should I use this medicine? Take this medicine by mouth with a glass of water. Follow the directions on the prescription label. Take this medicine on an empty stomach, about 30 minutes before eating. Take your doses at regular intervals. Do not take your medicine more often than directed. Do not stop taking except on the advice of your doctor or health care professional. A special MedGuide will be given to you by the pharmacist with each prescription and refill. Be sure to read this information carefully each time. Talk to your pediatrician regarding the use of this medicine in children. Special care may be needed. Overdosage: If you think you have taken too much of this medicine contact a poison control center or emergency room at once. NOTE: This medicine is only for you. Do not share this medicine with others. What if I miss a dose? If you miss a dose, take it as soon as you can. If it is almost time for your next dose, take only that dose. Do not take double or extra doses. What may interact with this medicine? -acetaminophen -cyclosporine -digoxin -medicines for blood pressure -medicines for diabetes, including insulin -medicines for hay fever and other allergies -medicines for depression, especially a Monoamine Oxidase Inhibitor (MAOI) -medicines for Parkinson's disease, like levodopa -medicines for sleep or for pain -quinidine -tetracycline This list may not describe all possible interactions. Give your  health care provider a list of all the medicines, herbs, non-prescription drugs, or dietary supplements you use. Also tell them if you smoke, drink alcohol, or use illegal drugs. Some items may interact with your  medicine. What should I watch for while using this medicine? It may take a few weeks for your stomach condition to start to get better. However, do not take this medicine for longer than 12 weeks. The longer you take this medicine, and the more you take it, the greater your chances are of developing serious side effects. If you are an elderly patient, a female patient, or you have diabetes, you may be at an increased risk for side effects from this medicine. Contact your doctor immediately if you start having movements you cannot control such as lip smacking, rapid movements of the tongue, involuntary or uncontrollable movements of the eyes, head, arms and legs, or muscle twitches and spasms. Patients and their families should watch out for worsening depression or thoughts of suicide. Also watch out for any sudden or severe changes in feelings such as feeling anxious, agitated, panicky, irritable, hostile, aggressive, impulsive, severely restless, overly excited and hyperactive, or not being able to sleep. If this happens, especially at the beginning of treatment or after a change in dose, call your doctor. Do not treat yourself for high fever. Ask your doctor or health care professional for advice. You may get drowsy or dizzy. Do not drive, use machinery, or do anything that needs mental alertness until you know how this drug affects you. Do not stand or sit up quickly, especially if you are an older patient. This reduces the risk of dizzy or fainting spells. Alcohol can make you more drowsy and dizzy. Avoid alcoholic drinks. What side effects may I notice from receiving this medicine? Side effects that you should report to your doctor or health care professional as soon as possible: -allergic reactions like skin rash, itching or hives, swelling of the face, lips, or tongue -abnormal production of milk in females -breast enlargement in both males and females -change in the way you walk -difficulty  moving, speaking or swallowing -drooling, lip smacking, or rapid movements of the tongue -excessive sweating -fever -involuntary or uncontrollable movements of the eyes, head, arms and legs -irregular heartbeat or palpitations -muscle twitches and spasms -unusually weak or tired Side effects that usually do not require medical attention (report to your doctor or health care professional if they continue or are bothersome): -change in sex drive or performance -depressed mood -diarrhea -difficulty sleeping -headache -menstrual changes -restless or nervous This list may not describe all possible side effects. Call your doctor for medical advice about side effects. You may report side effects to FDA at 1-800-FDA-1088. Where should I keep my medicine? Keep out of the reach of children. Store at room temperature between 20 and 25 degrees C (68 and 77 degrees F). Protect from light. Keep container tightly closed. Throw away any unused medicine after the expiration date. NOTE: This sheet is a summary. It may not cover all possible information. If you have questions about this medicine, talk to your doctor, pharmacist, or health care provider.  2019 Elsevier/Gold Standard (2015-10-26 15:13:45)

## 2018-06-05 ENCOUNTER — Other Ambulatory Visit: Payer: Self-pay | Admitting: *Deleted

## 2018-06-05 LAB — CBC
Hematocrit: 39 % (ref 34.0–46.6)
Hemoglobin: 13.1 g/dL (ref 11.1–15.9)
MCH: 30 pg (ref 26.6–33.0)
MCHC: 33.6 g/dL (ref 31.5–35.7)
MCV: 89 fL (ref 79–97)
Platelets: 242 10*3/uL (ref 150–450)
RBC: 4.37 x10E6/uL (ref 3.77–5.28)
RDW: 12.1 % (ref 11.7–15.4)
WBC: 5.3 10*3/uL (ref 3.4–10.8)

## 2018-06-05 LAB — COMPREHENSIVE METABOLIC PANEL
ALT: 27 IU/L (ref 0–32)
AST: 28 IU/L (ref 0–40)
Albumin/Globulin Ratio: 1.8 (ref 1.2–2.2)
Albumin: 4.7 g/dL (ref 3.8–4.9)
Alkaline Phosphatase: 116 IU/L (ref 39–117)
BUN/Creatinine Ratio: 21 (ref 9–23)
BUN: 14 mg/dL (ref 6–24)
Bilirubin Total: 0.3 mg/dL (ref 0.0–1.2)
CO2: 26 mmol/L (ref 20–29)
Calcium: 10.1 mg/dL (ref 8.7–10.2)
Chloride: 103 mmol/L (ref 96–106)
Creatinine, Ser: 0.66 mg/dL (ref 0.57–1.00)
GFR calc Af Amer: 117 mL/min/{1.73_m2} (ref >59)
GFR calc non Af Amer: 102 mL/min/{1.73_m2} (ref >59)
Globulin, Total: 2.6 g/dL (ref 1.5–4.5)
Glucose: 83 mg/dL (ref 65–99)
Potassium: 5.2 mmol/L (ref 3.5–5.2)
Sodium: 140 mmol/L (ref 134–144)
Total Protein: 7.3 g/dL (ref 6.0–8.5)

## 2018-06-05 LAB — TSH: TSH: 2.25 u[IU]/mL (ref 0.450–4.500)

## 2018-06-05 MED ORDER — ONABOTULINUMTOXINA 100 UNITS IJ SOLR: INTRAMUSCULAR | 3 refills | Status: DC

## 2018-06-05 NOTE — Telephone Encounter (Signed)
Noted, thank you. DW  °

## 2018-06-05 NOTE — Telephone Encounter (Signed)
Botox order placed per v.o. Dr. Jaynee Eagles. Sent to [facility] [place]

## 2018-06-05 NOTE — Telephone Encounter (Signed)
I called and spoke with the patient regarding scheduling her botox injection. We scheduled and discussed the SP and reimbursement program.   Romelle Starcher, would you send the script to [facility] [place]?

## 2018-06-05 NOTE — Telephone Encounter (Signed)
Botox A 200 units under diagnosis code G43.711. Form signed by Dr. Jaynee Eagles and placed on Danielle's desk.

## 2018-06-11 ENCOUNTER — Telehealth: Payer: Self-pay | Admitting: Neurology

## 2018-06-11 ENCOUNTER — Ambulatory Visit: Payer: BC Managed Care – PPO | Admitting: Rehabilitative and Restorative Service Providers"

## 2018-06-11 NOTE — Telephone Encounter (Signed)
I spoke to the patient to schedule her MRI. But due to the cost she wants to hold of on it I did offer her the payment plan.  She was suppose to get dry needling done today but because she was having back problems yesterday she canceled her appointment. She was wondering if Dr. Jaynee Eagles could also put a referral in to do something with her back along with doing the dry needling.

## 2018-06-11 NOTE — Telephone Encounter (Signed)
Fine about the MRI and dry needling. I don't think we discussed her back at all so I have no information on it and can;t really make any recommendations or orders. I would have her call her pcp because I don;t have any info on her back, maybe her pcp could order PT or something for her, et her know thanks.

## 2018-06-11 NOTE — Telephone Encounter (Signed)
spoke to the pt due to the cost wants to hold off. I did offer her the payment plan.  BCBS Auth: [id number] (exp. 06/10/18 to 12/06/18)

## 2018-06-11 NOTE — Telephone Encounter (Signed)
I left a voicemail on patient phone per Dr. Jaynee Eagles since they did not discuss back issues she believes it is best that she contact her PCP to put a referral in for PT.

## 2018-06-20 ENCOUNTER — Encounter: Payer: Self-pay | Admitting: Rehabilitative and Restorative Service Providers"

## 2018-06-20 ENCOUNTER — Ambulatory Visit: Payer: BC Managed Care – PPO | Admitting: Rehabilitative and Restorative Service Providers"

## 2018-06-20 ENCOUNTER — Other Ambulatory Visit: Payer: Self-pay

## 2018-06-20 DIAGNOSIS — R29898 Other symptoms and signs involving the musculoskeletal system: Secondary | ICD-10-CM | POA: Diagnosis not present

## 2018-06-20 DIAGNOSIS — G4459 Other complicated headache syndrome: Secondary | ICD-10-CM | POA: Diagnosis not present

## 2018-06-20 DIAGNOSIS — R293 Abnormal posture: Secondary | ICD-10-CM

## 2018-06-20 NOTE — Patient Instructions (Signed)
Access Code: [email or web address]  URL: [email or web address]  Date: 06/20/2018  Prepared by: Gillermo Murdoch   Exercises  Seated Cervical Retraction - 10 reps - 1 sets - 3x daily - 7x weekly  Standing Scapular Retraction - 10 reps - 1 sets - 10 hold - 3x daily - 7x weekly  Shoulder External Rotation and Scapular Retraction - 10 reps - 1 sets - hold - 3x daily - 7x weekly  Standing Scapular Retraction in Abduction - 10 reps - 1 sets - 3x daily - 7x weekly  Doorway Pec Stretch at 60 Degrees Abduction - 3 reps - 1 sets - 3x daily - 7x weekly  Doorway Pec Stretch at 90 Degrees Abduction - 3 reps - 1 sets - 30 seconds hold - 3x daily - 7x weekly  Doorway Pec Stretch at 120 Degrees Abduction - 3 reps - 1 sets - 30 second hold hold - 3x daily - 7x weekly  Patient Education  TENS Unit  Trigger Point Dry Needling

## 2018-06-20 NOTE — Therapy (Signed)
[facility] [place], [place], [address] Phone: [phone]   Fax:  [phone]  Physical Therapy Evaluation  Patient Details  Name: Lacey Jensen MRN: [id number] Date of Birth: 09/15/1965 Referring Provider (PT): Dr Sarina Ill    Encounter Date: 06/20/2018  PT End of Session - 06/20/18 1205    Visit Number  1    Number of Visits  12    Date for PT Re-Evaluation  08/01/18    PT Start Time  1000    PT Stop Time  1059    PT Time Calculation (min)  59 min    Activity Tolerance  Patient tolerated treatment well       Past Medical History:  Diagnosis Date  . Abnormal Pap smear    Age 89 and age 70  . Dysplasia of cervix    leep  . Headache(784.0)   . Immunotherapy     Past Surgical History:  Procedure Laterality Date  . A GYN surgery  2002  . btl      There were no vitals filed for this visit.   Subjective Assessment - 06/20/18 1007    Subjective  Patient reports that she has had migraines for the past 30 years. Headaches are almost constant but variable intensity. Really bad headaches at 2-3 times a week which partially resolves with rest and meds.     Pertinent History  denies any medical problems; restless leg     Diagnostic tests  MRI/CT    Patient Stated Goals  help manage headaches     Currently in Pain?  Yes    Pain Score  5     Pain Location  Head    Pain Orientation  Left;Posterior;Anterior    Pain Descriptors / Indicators  Aching;Nagging;Throbbing    Pain Type  Chronic pain    Pain Radiating Towards  into the neck area     Pain Onset  More than a month ago    Pain Frequency  Intermittent    Aggravating Factors   stress    Pain Relieving Factors  rest; meds - OTC antiinflammatory          [facility] PT Assessment - 06/20/18 0001      Assessment   Medical Diagnosis  Chronic migraine     Referring Provider (PT)  Dr Sarina Ill     Onset Date/Surgical Date  --   HA's x 30 years     Hand Dominance  Right    Next MD Visit  07/03/2018    Prior Therapy  none for headaches       Precautions   Precautions  None      Restrictions   Weight Bearing Restrictions  No      Balance Screen   Has the patient fallen in the past 6 months  No    Has the patient had a decrease in activity level because of a fear of falling?   No    Is the patient reluctant to leave their home because of a fear of falling?   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     Leisure  household chores; yoga - 1-2 times/wk 30 -45 min; walks dogs       Sensation  Additional Comments  WFL's per pt report       Posture/Postural Control   Posture Comments  head forward; shoudlers rounded and elevated; scapulae rounded around the thoracic spine       AROM   Right/Left Shoulder  --   WFL's bilat UE's    Cervical Flexion  60    Cervical Extension  60    Cervical - Right Side Bend  28    Cervical - Left Side Bend  23    Cervical - Right Rotation  65    Cervical - Left Rotation  65      Strength   Overall Strength Comments  WFL's bilat UE's       Palpation   Spinal mobility  hypomobile cervical and thoracic spine with CP mobs > T3/4     Palpation comment  muscular tightness through pecs; upper trap; leveator; teres; ant/lat/post cervical musculature                 Objective measurements completed on examination: See above findings.      [facility] PT Treatment/Exercise - 06/20/18 0001      Neuro Re-ed    Neuro Re-ed Details   postural correction       Shoulder Exercises: Standing   Other Standing Exercises  axial extension 10 sec x 5; scap squeeze 10 sec x 10; L's x 10; W's x 10 with noodle       Shoulder Exercises: Stretch   Other Shoulder Stretches  3 way doorway stretch 30 sec x 3 reps each position       Moist  Heat Therapy   Number Minutes Moist Heat  15 Minutes    Moist Heat Location  Cervical   thoracic     Electrical Stimulation   Electrical Stimulation Location  bilat cervical    Electrical Stimulation Action  IFC    Electrical Stimulation Parameters  to tolerance    Electrical Stimulation Goals  Pain;Tone      Manual Therapy   Manual Therapy  Soft tissue mobilization;Myofascial release;Scapular mobilization;Passive ROM;Manual Traction    Manual therapy comments  pt supine              PT Education - 06/20/18 1034    Education Details  HEP DN TENS     Person(s) Educated  Patient    Methods  Explanation;Demonstration;Tactile cues;Verbal cues;Handout    Comprehension  Verbalized understanding;Returned demonstration;Verbal cues required;Tactile cues required          PT Long Term Goals - 06/20/18 1213      PT LONG TERM GOAL #1   Title  Improve cervical and thoracic postue with patient to demonstrate improved upright posture with posterior shoulder girdle engaged 08/01/2018    Time  6    Period  Weeks    Status  New      PT LONG TERM GOAL #2   Title  Increase lateral cervical flexion to 30-35 deg bilat 08/01/2018    Time  6    Period  Weeks    Status  New      PT LONG TERM GOAL #3   Title  Decrease frequency, intensity, duration of headaches by 50-75% allowing patient to participate in ADL's and life activities with less down time due to headache pain 08/01/2018    Time  6    Period  Weeks    Status  New      PT LONG TERM GOAL #4  Title  Independent in [place] 08/01/2018    Time  [place] - 06/20/18 1034    Clinical Impression Statement  Lacey Jensen presents with ~ 30+ year history of migraine headaches. She has a headache most days of the week with more severe headaches 2-3 days/week requiring her to lie down. She has been treating headaches with OTC anitinflammatory meds on a frequent basis but MD has ask that she stop  taking OTC meds. Lacey Jensen has not had OTC antiinflammatory in 7 days. She is trying new prescription meds for HA's. Patient presents with poor posture and alignment; limited cervical ROM especially in lateral flexion; poor scapular/postural strength; significant muscular tightness to palpation.  She will benefit from Pt to address problems identified.     Stability/Clinical Decision Making  Stable/Uncomplicated    Clinical Decision Making  Low    Rehab Potential  Good    PT Frequency  2x / week    PT Duration  6 weeks    PT Treatment/Interventions  Patient/family education;ADLs/Self Care Home Management;Cryotherapy;Electrical Stimulation;Iontophoresis 4mg /ml Dexamethasone;Moist Heat;Traction;Ultrasound;Dry needling;Manual techniques;Therapeutic activities;Therapeutic exercise;Neuromuscular re-education    PT Next Visit Plan  review HEP; trial of DN vs manual work; neuromuscular re-education; stretching; postural strengthening; education; modalities as indicated     PT Home Exercise Plan  Access Code: [email or web address]     Consulted and Agree with Plan of Care  Patient       Patient will benefit from skilled therapeutic intervention in order to improve the following deficits and impairments:  Postural dysfunction, Improper body mechanics, Pain, Impaired sensation, Increased muscle spasms, Hypomobility, Decreased mobility, Decreased range of motion, Decreased activity tolerance  Visit Diagnosis: Other complicated headache syndrome - Plan: PT plan of care cert/re-cert  Other symptoms and signs involving the musculoskeletal system - Plan: PT plan of care cert/re-cert  Abnormal posture - Plan: PT plan of care cert/re-cert     Problem List Patient Active Problem List   Diagnosis Date Noted  . Chronic migraine without aura, with intractable migraine, so stated, with status migrainosus 06/04/2018  . Cervical myofascial pain syndrome 06/04/2018  . Anxiety 06/05/2017  . Insomnia 12/02/2013  . History of  abnormal Pap smear 06/18/2012  . Migraine without aura 09/08/2009  . ALLERGIC RHINITIS 04/25/2009  . Attention deficit hyperactivity disorder (ADHD) 06/25/2008    Lacey Jensen Nilda Simmer PT, MPH  06/20/2018, 12:18 PM  [facility] [place], [place], [address] Phone: [phone]   Fax:  [phone]  Name: Lacey Jensen MRN: [id number] Date of Birth: 1966-01-20

## 2018-06-24 ENCOUNTER — Other Ambulatory Visit: Payer: Self-pay | Admitting: Family Medicine

## 2018-06-25 ENCOUNTER — Telehealth: Payer: Self-pay | Admitting: Neurology

## 2018-06-25 NOTE — Telephone Encounter (Signed)
Please send script for Botox to [facility] [place].   Authorization forms have been filled out and faxed to [facility] with clinicals.

## 2018-07-01 NOTE — Telephone Encounter (Signed)
I tried reaching the patient again but she did not answer.

## 2018-07-01 NOTE — Telephone Encounter (Signed)
Pt returned call and stated she reached out to pharmarcy and they stated they need a call from out office

## 2018-07-01 NOTE — Telephone Encounter (Signed)
I called to schedule delivery of the patients medication but it is awaiting new patient enrollment and consent. They have tried reaching the patient but have not been successful. I called the patient but she did not answer so I left a VM with the pharmacys number. DW

## 2018-07-02 ENCOUNTER — Telehealth: Payer: Self-pay | Admitting: Neurology

## 2018-07-02 ENCOUNTER — Other Ambulatory Visit: Payer: Self-pay

## 2018-07-02 ENCOUNTER — Ambulatory Visit (INDEPENDENT_AMBULATORY_CARE_PROVIDER_SITE_OTHER): Payer: BC Managed Care – PPO | Admitting: Neurology

## 2018-07-02 DIAGNOSIS — G43711 Chronic migraine without aura, intractable, with status migrainosus: Secondary | ICD-10-CM | POA: Diagnosis not present

## 2018-07-02 NOTE — Progress Notes (Signed)
Botox consent signed Botox- 200 units x 1 vial Lot: [id number] Expiration: 11/2020 NDC: 2542-7062-37  Bacteriostatic 0.9% Sodium Chloride- 53mL total Lot: [id number] Expiration: 10/23/2018 NDC: 1761-6073-71  Dx: G62.694 B/B

## 2018-07-02 NOTE — Progress Notes (Signed)
Consent Form Botulism Toxin Injection For Chronic Migraine  Our first botox. + Mass,templ,OO  Reviewed orally with patient, additionally signature is on file:  Botulism toxin has been approved by the Federal drug administration for treatment of chronic migraine. Botulism toxin does not cure chronic migraine and it may not be effective in some patients.  The administration of botulism toxin is accomplished by injecting a small amount of toxin into the muscles of the neck and head. Dosage must be titrated for each individual. Any benefits resulting from botulism toxin tend to wear off after 3 months with a repeat injection required if benefit is to be maintained. Injections are usually done every 3-4 months with maximum effect peak achieved by about 2 or 3 weeks. Botulism toxin is expensive and you should be sure of what costs you will incur resulting from the injection.  The side effects of botulism toxin use for chronic migraine may include:   -Transient, and usually mild, facial weakness with facial injections  -Transient, and usually mild, head or neck weakness with head/neck injections  -Reduction or loss of forehead facial animation due to forehead muscle weakness  -Eyelid drooping  -Dry eye  -Pain at the site of injection or bruising at the site of injection  -Double vision  -Potential unknown long term risks  Contraindications: You should not have Botox if you are pregnant, nursing, allergic to albumin, have an infection, skin condition, or muscle weakness at the site of the injection, or have myasthenia gravis, Lambert-Eaton syndrome, or ALS.  It is also possible that as with any injection, there may be an allergic reaction or no effect from the medication. Reduced effectiveness after repeated injections is sometimes seen and rarely infection at the injection site may occur. All care will be taken to prevent these side effects. If therapy is given over a long time, atrophy and  wasting in the muscle injected may occur. Occasionally the patient's become refractory to treatment because they develop antibodies to the toxin. In this event, therapy needs to be modified.  I have read the above information and consent to the administration of botulism toxin.    BOTOX PROCEDURE NOTE FOR MIGRAINE HEADACHE    Contraindications and precautions discussed with patient(above). Aseptic procedure was observed and patient tolerated procedure. Procedure performed by Dr. Georgia Dom  The condition has existed for more than 6 months, and pt does not have a diagnosis of ALS, Myasthenia Gravis or Lambert-Eaton Syndrome.  Risks and benefits of injections discussed and pt agrees to proceed with the procedure.  Written consent obtained  These injections are medically necessary. Pt  receives good benefits from these injections. These injections do not cause sedations or hallucinations which the oral therapies may cause.  Description of procedure:  The patient was placed in a sitting position. The standard protocol was used for Botox as follows, with 5 units of Botox injected at each site:   -Procerus muscle, midline injection  -Corrugator muscle, bilateral injection  -Frontalis muscle, bilateral injection, with 2 sites each side, medial injection was performed in the upper one third of the frontalis muscle, in the region vertical from the medial inferior edge of the superior orbital rim. The lateral injection was again in the upper one third of the forehead vertically above the lateral limbus of the cornea, 1.5 cm lateral to the medial injection site.  - Levator Scapulae: 5 units bilaterally  -Temporalis muscle injection, 5 sites, bilaterally. The first injection was 3 cm above the  tragus of the ear, second injection site was 1.5 cm to 3 cm up from the first injection site in line with the tragus of the ear. The third injection site was 1.5-3 cm forward between the first 2 injection  sites. The fourth injection site was 1.5 cm posterior to the second injection site. 5th site laterally in the temporalis  muscleat the level of the outer canthus.  - Patient feels her clenching is a trigger for headaches. +5 units masseter bilaterally   - Patient feels the migraines are centered around the eyes +5 units bilaterally at the outer canthus in the orbicularis occuli  -Occipitalis muscle injection, 3 sites, bilaterally. The first injection was done one half way between the occipital protuberance and the tip of the mastoid process behind the ear. The second injection site was done lateral and superior to the first, 1 fingerbreadth from the first injection. The third injection site was 1 fingerbreadth superiorly and medially from the first injection site.  -Cervical paraspinal muscle injection, 2 sites, bilateral knee first injection site was 1 cm from the midline of the cervical spine, 3 cm inferior to the lower border of the occipital protuberance. The second injection site was 1.5 cm superiorly and laterally to the first injection site.  -Trapezius muscle injection was performed at 3 sites, bilaterally. The first injection site was in the upper trapezius muscle halfway between the inflection point of the neck, and the acromion. The second injection site was one half way between the acromion and the first injection site. The third injection was done between the first injection site and the inflection point of the neck.   Will return for repeat injection in 3 months.   A 200 unit sof Botox was used, any Botox not injected was wasted. The patient tolerated the procedure well, there were no complications of the above procedure.

## 2018-07-02 NOTE — Telephone Encounter (Signed)
I called and spoke with the patient regarding her medication, pharmacy could not fill in time for apt will convert to BB for this fill. DW

## 2018-07-02 NOTE — Telephone Encounter (Signed)
3 mo botox inj

## 2018-07-07 NOTE — Telephone Encounter (Signed)
I called to schedule the patient for her Follow Up apt for Botox. She did not answer so I left a VM asking her to call back. If she calls back please make her aware of the scheduled apt date and time and confirm this works. Thank you.

## 2018-07-10 ENCOUNTER — Other Ambulatory Visit: Payer: Self-pay | Admitting: Family Medicine

## 2018-07-29 ENCOUNTER — Telehealth: Payer: Self-pay

## 2018-07-29 MED ORDER — MUPIROCIN 2 % EX OINT: TOPICAL_OINTMENT | CUTANEOUS | 0 refills | Status: DC

## 2018-07-29 NOTE — Telephone Encounter (Signed)
Left pt msg advising RX sent  

## 2018-07-29 NOTE — Telephone Encounter (Signed)
Lacey Jensen called and states she has sores in her nose as before. She would like a refill of the Bactroban sent to [facility] in [place]. Last prescribed in 2017.

## 2018-07-29 NOTE — Telephone Encounter (Signed)
Scription sent.  Call if any problems.

## 2018-08-06 NOTE — Telephone Encounter (Signed)
Error. DW

## 2018-10-07 ENCOUNTER — Ambulatory Visit: Payer: BC Managed Care – PPO | Admitting: Neurology

## 2018-10-08 ENCOUNTER — Ambulatory Visit: Payer: BC Managed Care – PPO | Admitting: Neurology

## 2018-10-13 ENCOUNTER — Encounter: Payer: Self-pay | Admitting: Family Medicine

## 2018-10-13 ENCOUNTER — Telehealth (INDEPENDENT_AMBULATORY_CARE_PROVIDER_SITE_OTHER): Payer: BC Managed Care – PPO | Admitting: Family Medicine

## 2018-10-13 VITALS — Ht 70.0 in

## 2018-10-13 DIAGNOSIS — K5909 Other constipation: Secondary | ICD-10-CM

## 2018-10-13 DIAGNOSIS — G4709 Other insomnia: Secondary | ICD-10-CM

## 2018-10-13 DIAGNOSIS — R140 Abdominal distension (gaseous): Secondary | ICD-10-CM | POA: Diagnosis not present

## 2018-10-13 DIAGNOSIS — F902 Attention-deficit hyperactivity disorder, combined type: Secondary | ICD-10-CM | POA: Diagnosis not present

## 2018-10-13 MED ORDER — AMPHETAMINE-DEXTROAMPHET ER 15 MG PO CP24: 15.0000 mg | ORAL_CAPSULE | ORAL | 0 refills | Status: DC

## 2018-10-13 MED ORDER — PROGESTERONE MICRONIZED 100 MG PO CAPS: 100.0000 mg | ORAL_CAPSULE | Freq: Every evening | ORAL | 1 refills | Status: DC | PRN

## 2018-10-13 NOTE — Progress Notes (Signed)
lvm advising pt that I was calling to obtain her VS and go over medications prior to her virtual visit. Advised that Dr. Madilyn Fireman is running a little behind schedule and asked that she have the requested information ready and to go ahead an log in for her appt.Lacey Jensen, [place]

## 2018-10-13 NOTE — Assessment & Plan Note (Signed)
Insomnia-discussed signs.  We will try to check progesterone capsules at bedtime.  If not helpful then consider low-dose doxepin at bedtime.  Will try for 30 days.

## 2018-10-13 NOTE — Assessment & Plan Note (Signed)
Happy with current regimen.  Refill sent to pharmacy.  Follow-up in 4 months.

## 2018-10-13 NOTE — Progress Notes (Signed)
Virtual Visit via Video Note  I connected with Lacey Jensen on 10/13/18 at 11:10 AM EDT by a video enabled telemedicine application and verified that I am speaking with the correct person using two identifiers.   I discussed the limitations of evaluation and management by telemedicine and the availability of in person appointments. The patient expressed understanding and agreed to proceed.    Established Patient Office Visit  Subjective:  Patient ID: Lacey Jensen, female    DOB: 1965/10/26  Age: 53 y.o. MRN: [id number]  CC:  Chief Complaint  Patient presents with  . Insomnia    HPI Lacey Jensen presents for a couple of concerns.  She still struggling with sleep problems.  She says it is mostly difficulty falling asleep but some staying asleep as well.  Think some of it could be related to menopause.  She has tried some over-the-counter products including melatonin.  She is tried prescription Ambien and says that she had significant side effects and did not like how she felt on it.  She is actually tried [employer] and says she experienced sleepwalking while on it.  ADHD - Reports symptoms are well controlled on current regime. Denies any problems with insomnia, chest pain, palpitations, or SOB.    Still struggling with constipation.  She says she is been trying to be very consistent about taking her daily probiotic and fiber supplements.  She still has a lot of problems with bloating and gas.  She has been having to strain more and this is been flaring her hemorrhoids.  She does not want to get to the place where it was real bad like it was last year.  Past Medical History:  Diagnosis Date  . Abnormal Pap smear    Age 51 and age 33  . Dysplasia of cervix    leep  . Headache(784.0)   . Immunotherapy     Past Surgical History:  Procedure Laterality Date  . A GYN surgery  2002  . btl      Family History  Problem Relation Age of Onset  . Migraines Son     Social History    Socioeconomic History  . Marital status: Married    Spouse name: Gwyndolyn Saxon  . Number of children: 2  . Years of education: Not on file  . Highest education level: Master's degree (e.g., MA, MS, MEng, MEd, MSW, MBA)  Occupational History  . Occupation: Conservator, museum/gallery  . Financial resource strain: Not on file  . Food insecurity    Worry: Not on file    Inability: Not on file  . Transportation needs    Medical: Not on file    Non-medical: Not on file  Tobacco Use  . Smoking status: Former Smoker    Quit date: 01/23/2004    Years since quitting: 14.7  . Smokeless tobacco: Never Used  Substance and Sexual Activity  . Alcohol use: Yes    Alcohol/week: 1.0 standard drinks    Types: 1 Standard drinks or equivalent per week  . Drug use: No  . Sexual activity: Yes    Birth control/protection: None    Comment: IN MENOPAUSE  Lifestyle  . Physical activity    Days per week: Not on file    Minutes per session: Not on file  . Stress: Not on file  Relationships  . Social Herbalist on phone: Not on file    Gets together: Not on file  Attends religious service: Not on file    Active member of club or organization: Not on file    Attends meetings of clubs or organizations: Not on file    Relationship status: Not on file  . Intimate partner violence    Fear of current or ex partner: Not on file    Emotionally abused: Not on file    Physically abused: Not on file    Forced sexual activity: Not on file  Other Topics Concern  . Not on file  Social History Narrative   She was adopted so we do not have an extensive family history. She does yoga for exercise and drinks 2 caffeinated drinks per day.      Lives at home with her husband & 3 dogs   Right handed    Outpatient Medications Prior to Visit  Medication Sig Dispense Refill  . atenolol (TENORMIN) 25 MG tablet Take 1 tablet (25 mg total) by mouth daily. 30 tablet 11  . azelastine (ASTELIN) 0.1 % nasal  spray U 1 SPR IEN BID    . botulinum toxin Type A (BOTOX) 100 units SOLR injection Inject 155 units into the muscles of the head and neck every 3 months. To be injected by the provider. Discard remainder. 200 Units 3  . cyclobenzaprine (FLEXERIL) 10 MG tablet Take 1 tablet (10 mg total) by mouth every 8 (eight) hours as needed for muscle spasms. 40 tablet 2  . FIBER PO Take by mouth.    Marland Kitchen FLUoxetine (PROZAC) 20 MG tablet TAKE 2 TABLETS BY MOUTH DAILY 180 tablet 1  . MAGNESIUM PO Take by mouth.    . metoCLOPramide (REGLAN) 10 MG tablet Take 1 tablet (10 mg total) by mouth every 6 (six) hours as needed for nausea. Or for migraines. 90 tablet 4  . mupirocin ointment (BACTROBAN) 2 % Apply to inside of each nares daily for 10 days then twice a week for maintenance. 30 g 0  . Probiotic Product (PROBIOTIC PO) Take by mouth.    . valACYclovir (VALTREX) 500 MG tablet TAKE 1 TABLET(500 MG) BY MOUTH TWICE DAILY 14 tablet 6  . amphetamine-dextroamphetamine (ADDERALL XR) 15 MG 24 hr capsule Take 1 capsule by mouth every morning. 30 capsule 0  . amphetamine-dextroamphetamine (ADDERALL XR) 15 MG 24 hr capsule Take 1 capsule by mouth every morning. 30 capsule 0  . amphetamine-dextroamphetamine (ADDERALL XR) 15 MG 24 hr capsule Take 1 capsule by mouth every morning. 30 capsule 0   No facility-administered medications prior to visit.     Allergies  Allergen Reactions  . Ambien [Zolpidem] Other (See Comments)    Sleep walking and memory loss  . Belsomra [Suvorexant] Other (See Comments)    Bad dreams, sleep walking  . Sumatriptan Succinate     REACTION: Made her feel high  . Zolmitriptan     REACTION: Made her feel high    ROS Review of Systems    Objective:    Physical Exam  Ht 5\' 10"  (1.778 m)   LMP 11/23/2015   BMI 24.25 kg/m  Wt Readings from Last 3 Encounters:  06/04/18 169 lb (76.7 kg)  05/22/18 164 lb (74.4 kg)  12/09/17 164 lb (74.4 kg)     Health Maintenance Due  Topic Date Due   . COLONOSCOPY  06/25/2015  . PAP SMEAR-Modifier  08/21/2018    There are no preventive care reminders to display for this patient.  Lab Results  Component Value Date   TSH 2.250  06/04/2018   Lab Results  Component Value Date   WBC 5.3 06/04/2018   HGB 13.1 06/04/2018   HCT 39.0 06/04/2018   MCV 89 06/04/2018   PLT 242 06/04/2018   Lab Results  Component Value Date   NA 140 06/04/2018   K 5.2 06/04/2018   CO2 26 06/04/2018   GLUCOSE 83 06/04/2018   BUN 14 06/04/2018   CREATININE 0.66 06/04/2018   BILITOT 0.3 06/04/2018   ALKPHOS 116 06/04/2018   AST 28 06/04/2018   ALT 27 06/04/2018   PROT 7.3 06/04/2018   ALBUMIN 4.7 06/04/2018   CALCIUM 10.1 06/04/2018   Lab Results  Component Value Date   CHOL 183 09/17/2012   Lab Results  Component Value Date   HDL 64 09/17/2012   Lab Results  Component Value Date   LDLCALC 101 (H) 09/17/2012   Lab Results  Component Value Date   TRIG 90 09/17/2012   Lab Results  Component Value Date   CHOLHDL 2.9 09/17/2012   No results found for: HGBA1C    Assessment & Plan:   Problem List Items Addressed This Visit      Digestive   Chronic constipation    At this point she has been consistent with her fiber and probiotics so recommend adding a half a capful of MiraLAX at bedtime.  She can adjust up or down depending on the results.        Other   Insomnia    Insomnia-discussed signs.  We will try to check progesterone capsules at bedtime.  If not helpful then consider low-dose doxepin at bedtime.  Will try for 30 days.      Relevant Medications   progesterone (PROMETRIUM) 100 MG capsule   Bloating   Attention deficit hyperactivity disorder (ADHD) - Primary    Happy with current regimen.  Refill sent to pharmacy.  Follow-up in 4 months.      Relevant Medications   amphetamine-dextroamphetamine (ADDERALL XR) 15 MG 24 hr capsule (Start on 12/11/2018)   amphetamine-dextroamphetamine (ADDERALL XR) 15 MG 24 hr capsule  (Start on 11/11/2018)   amphetamine-dextroamphetamine (ADDERALL XR) 15 MG 24 hr capsule      Meds ordered this encounter  Medications  . amphetamine-dextroamphetamine (ADDERALL XR) 15 MG 24 hr capsule    Sig: Take 1 capsule by mouth every morning.    Dispense:  30 capsule    Refill:  0  . amphetamine-dextroamphetamine (ADDERALL XR) 15 MG 24 hr capsule    Sig: Take 1 capsule by mouth every morning.    Dispense:  30 capsule    Refill:  0  . amphetamine-dextroamphetamine (ADDERALL XR) 15 MG 24 hr capsule    Sig: Take 1 capsule by mouth every morning.    Dispense:  30 capsule    Refill:  0  . progesterone (PROMETRIUM) 100 MG capsule    Sig: Take 1 capsule (100 mg total) by mouth at bedtime as needed.    Dispense:  30 capsule    Refill:  1    Follow-up: Return in about 4 months (around 02/12/2019) for ADHD.    I discussed the assessment and treatment plan with the patient. The patient was provided an opportunity to ask questions and all were answered. The patient agreed with the plan and demonstrated an understanding of the instructions.   The patient was advised to call back or seek an in-person evaluation if the symptoms worsen or if the condition fails to improve as anticipated  Barnetta Chapel  Madilyn Fireman, MD

## 2018-10-13 NOTE — Assessment & Plan Note (Signed)
At this point she has been consistent with her fiber and probiotics so recommend adding a half a capful of MiraLAX at bedtime.  She can adjust up or down depending on the results.

## 2018-10-14 ENCOUNTER — Ambulatory Visit: Payer: BC Managed Care – PPO | Admitting: Neurology

## 2018-10-14 ENCOUNTER — Other Ambulatory Visit: Payer: Self-pay

## 2018-10-14 DIAGNOSIS — G43711 Chronic migraine without aura, intractable, with status migrainosus: Secondary | ICD-10-CM

## 2018-10-14 DIAGNOSIS — G43011 Migraine without aura, intractable, with status migrainosus: Secondary | ICD-10-CM

## 2018-10-14 MED ORDER — CYCLOBENZAPRINE HCL 10 MG PO TABS: 10.0000 mg | ORAL_TABLET | Freq: Three times a day (TID) | ORAL | 11 refills | Status: DC | PRN

## 2018-10-14 MED ORDER — METOCLOPRAMIDE HCL 10 MG PO TABS: 10.0000 mg | ORAL_TABLET | Freq: Four times a day (QID) | ORAL | 6 refills | Status: DC | PRN

## 2018-10-14 NOTE — Progress Notes (Signed)
Consent Form Botulism Toxin Injection For Chronic Migraine  Our first botox. +a, extremely well, >50% increase in frequency, the first time she has had a significant change in her botox.   Reviewed orally with patient, additionally signature is on file:  Consent Form Botulism Toxin Injection For Chronic Migraine    Reviewed orally with patient, additionally signature is on file:  Botulism toxin has been approved by the Federal drug administration for treatment of chronic migraine. Botulism toxin does not cure chronic migraine and it may not be effective in some patients.  The administration of botulism toxin is accomplished by injecting a small amount of toxin into the muscles of the neck and head. Dosage must be titrated for each individual. Any benefits resulting from botulism toxin tend to wear off after 3 months with a repeat injection required if benefit is to be maintained. Injections are usually done every 3-4 months with maximum effect peak achieved by about 2 or 3 weeks. Botulism toxin is expensive and you should be sure of what costs you will incur resulting from the injection.  The side effects of botulism toxin use for chronic migraine may include:   -Transient, and usually mild, facial weakness with facial injections  -Transient, and usually mild, head or neck weakness with head/neck injections  -Reduction or loss of forehead facial animation due to forehead muscle weakness  -Eyelid drooping  -Dry eye  -Pain at the site of injection or bruising at the site of injection  -Double vision  -Potential unknown long term risks  Contraindications: You should not have Botox if you are pregnant, nursing, allergic to albumin, have an infection, skin condition, or muscle weakness at the site of the injection, or have myasthenia gravis, Lambert-Eaton syndrome, or ALS.  It is also possible that as with any injection, there may be an allergic reaction or no effect from the  medication. Reduced effectiveness after repeated injections is sometimes seen and rarely infection at the injection site may occur. All care will be taken to prevent these side effects. If therapy is given over a long time, atrophy and wasting in the muscle injected may occur. Occasionally the patient's become refractory to treatment because they develop antibodies to the toxin. In this event, therapy needs to be modified.  I have read the above information and consent to the administration of botulism toxin.    BOTOX PROCEDURE NOTE FOR MIGRAINE HEADACHE    Contraindications and precautions discussed with patient(above). Aseptic procedure was observed and patient tolerated procedure. Procedure performed by Dr. Georgia Dom  The condition has existed for more than 6 months, and pt does not have a diagnosis of ALS, Myasthenia Gravis or Lambert-Eaton Syndrome.  Risks and benefits of injections discussed and pt agrees to proceed with the procedure.  Written consent obtained  These injections are medically necessary. Pt  receives good benefits from these injections. These injections do not cause sedations or hallucinations which the oral therapies may cause.  Description of procedure:  The patient was placed in a sitting position. The standard protocol was used for Botox as follows, with 5 units of Botox injected at each site:   -Procerus muscle, midline injection  -Corrugator muscle, bilateral injection  -Frontalis muscle, bilateral injection, with 2 sites each side, medial injection was performed in the upper one third of the frontalis muscle, in the region vertical from the medial inferior edge of the superior orbital rim. The lateral injection was again in the upper one third of the  forehead vertically above the lateral limbus of the cornea, 1.5 cm lateral to the medial injection site.  -Temporalis muscle injection, 4 sites, bilaterally. The first injection was 3 cm above the tragus of the  ear, second injection site was 1.5 cm to 3 cm up from the first injection site in line with the tragus of the ear. The third injection site was 1.5-3 cm forward between the first 2 injection sites. The fourth injection site was 1.5 cm posterior to the second injection site.   -Occipitalis muscle injection, 3 sites, bilaterally. The first injection was done one half way between the occipital protuberance and the tip of the mastoid process behind the ear. The second injection site was done lateral and superior to the first, 1 fingerbreadth from the first injection. The third injection site was 1 fingerbreadth superiorly and medially from the first injection site.  -Cervical paraspinal muscle injection, 2 sites, bilateral knee first injection site was 1 cm from the midline of the cervical spine, 3 cm inferior to the lower border of the occipital protuberance. The second injection site was 1.5 cm superiorly and laterally to the first injection site.  -Trapezius muscle injection was performed at 3 sites, bilaterally. The first injection site was in the upper trapezius muscle halfway between the inflection point of the neck, and the acromion. The second injection site was one half way between the acromion and the first injection site. The third injection was done between the first injection site and the inflection point of the neck.   Will return for repeat injection in 3 months.   200 units of Botox was used, any Botox not injected was wasted. The patient tolerated the procedure well, there were no complications of the above procedure.

## 2018-10-14 NOTE — Progress Notes (Signed)
Botox- 200 units x 1 vial Lot: [id number] Expiration: 05/2021 NDC: [id number]  Bacteriostatic 0.9% Sodium Chloride- 24mL total Lot: [id number] Expiration: 10/23/2018 NDC: [id number]  Dx: [id number] B/B

## 2018-11-12 ENCOUNTER — Other Ambulatory Visit: Payer: Self-pay | Admitting: Family Medicine

## 2018-12-04 ENCOUNTER — Telehealth: Payer: Self-pay | Admitting: Family Medicine

## 2018-12-04 ENCOUNTER — Telehealth: Payer: Self-pay | Admitting: Neurology

## 2018-12-04 NOTE — Telephone Encounter (Signed)
Patient called wanting to know if she could move her appointment before 12/29 or if she will need to schedule in January. Patient states she will be out of town for the holidays that week. Please follow up.

## 2018-12-08 NOTE — Telephone Encounter (Signed)
I called the patient back and left her a VM asking her to return our call. I went ahead and rs apt to first available. DW

## 2018-12-09 ENCOUNTER — Encounter: Payer: Self-pay | Admitting: Family Medicine

## 2018-12-09 ENCOUNTER — Telehealth (INDEPENDENT_AMBULATORY_CARE_PROVIDER_SITE_OTHER): Payer: BC Managed Care – PPO | Admitting: Family Medicine

## 2018-12-09 VITALS — BP 127/100 | Temp 97.2°F | Ht 68.9 in

## 2018-12-09 DIAGNOSIS — J0100 Acute maxillary sinusitis, unspecified: Secondary | ICD-10-CM

## 2018-12-09 MED ORDER — AMOXICILLIN-POT CLAVULANATE 875-125 MG PO TABS: 1.0000 | ORAL_TABLET | Freq: Two times a day (BID) | ORAL | 0 refills | Status: DC

## 2018-12-09 NOTE — Progress Notes (Signed)
lvm advising pt that I was calling to do her prescreening before her appt.Elouise Munroe, CMA  She reports HA x 1 wk frontal taking allergy medication and mucinex D.she has hx of migraines. She stated that the pain is at her eyebrows. Cough x 3 days. Yellow mucus She hasn't been around anyone w/sxs. Denies f/s/c/n/v/d.  Maryruth Eve, Lahoma Crocker, CMA

## 2018-12-09 NOTE — Progress Notes (Signed)
Virtual Visit via Video Note  I connected with [name] on 12/09/18 at  1:20 PM EST by a video enabled telemedicine application and verified that I am speaking with the correct person using two identifiers.   I discussed the limitations of evaluation and management by telemedicine and the availability of in person appointments. The patient expressed understanding and agreed to proceed.  Subjective:    CC: HA and cough.    HPI: She reports HA x 1 wk frontal taking allergy medication and mucinex D.she has hx of migraines. She stated that the pain is at her eyebrows. Cough x 3 days. Yellow mucus.  The cough is mostly in her throat and she just feels like it is from the drainage it does not feel like it is deep in her chest she is not had any shortness of breath or wheezing. She hasn't been around anyone w/sxs. Denies f/s/c/n/v/d. No known sick contacts.  No known Covid exposures.  No GI sxs. Mild sore throat. No ear pain. Some pain and pressure on the left facial cheek.   No loss of taset or smell.    Past medical history, Surgical history, Family history not pertinant except as noted below, Social history, Allergies, and medications have been entered into the medical record, reviewed, and corrections made.   Review of Systems: No fevers, chills, night sweats, weight loss, chest pain, or shortness of breath.   Objective:    General: Speaking clearly in complete sentences without any shortness of breath.  Alert and oriented x3.  Normal judgment. No apparent acute distress.    Impression and Recommendations:    Acute left maxillary sinusitis-this seems like her typical course in the fall.  I did let her know though that I could not 100% rule out Covid without her actually being tested as we are seeing quite a few patients presenting with some sinus congestion headache and sore throat.  She is pretty confident that she has not been around anybody who is been sick or with Covid.  Certainly  if she is not responding to the antibiotics pretty quickly then I would encourage her to go and get tested or she develops any new or worsening symptoms.  In the meantime we will treat with Augmentin.  Recommend adding nasal saline.  Her diastolic blood pressure was up a little bit today this occurred and certainly could be from decongestant so just encouraged her to have that rechecked by the school nurse when she is feeling better.      I discussed the assessment and treatment plan with the patient. The patient was provided an opportunity to ask questions and all were answered. The patient agreed with the plan and demonstrated an understanding of the instructions.   The patient was advised to call back or seek an in-person evaluation if the symptoms worsen or if the condition fails to improve as anticipated.   Beatrice Lecher, MD

## 2018-12-29 ENCOUNTER — Other Ambulatory Visit: Payer: Self-pay | Admitting: *Deleted

## 2018-12-29 DIAGNOSIS — G4709 Other insomnia: Secondary | ICD-10-CM

## 2018-12-29 MED ORDER — PROGESTERONE MICRONIZED 100 MG PO CAPS: 100.0000 mg | ORAL_CAPSULE | Freq: Every evening | ORAL | 1 refills | Status: DC | PRN

## 2019-01-20 ENCOUNTER — Ambulatory Visit: Payer: BC Managed Care – PPO | Admitting: Neurology

## 2019-02-03 ENCOUNTER — Ambulatory Visit: Payer: BC Managed Care – PPO | Admitting: Neurology

## 2019-02-03 ENCOUNTER — Other Ambulatory Visit: Payer: Self-pay

## 2019-02-03 VITALS — Temp 97.7°F

## 2019-02-03 DIAGNOSIS — G43711 Chronic migraine without aura, intractable, with status migrainosus: Secondary | ICD-10-CM

## 2019-02-03 MED ORDER — LORAZEPAM 0.5 MG PO TABS: 0.5000 mg | ORAL_TABLET | Freq: Three times a day (TID) | ORAL | 4 refills | Status: DC

## 2019-02-03 NOTE — Patient Instructions (Signed)
Lorazepam tablets What is this medicine? LORAZEPAM (lor A ze pam) is a benzodiazepine. It is used to treat anxiety. This medicine may be used for other purposes; ask your health care provider or pharmacist if you have questions. COMMON BRAND NAME(S): Ativan What should I tell my health care provider before I take this medicine? They need to know if you have any of these conditions:  glaucoma  history of drug or alcohol abuse problem  kidney disease  liver disease  lung or breathing disease, like asthma  mental illness  myasthenia gravis  Parkinson's disease  suicidal thoughts, plans, or attempt; a previous suicide attempt by you or a family member  an unusual or allergic reaction to lorazepam, other medicines, foods, dyes, or preservatives  pregnant or trying to get pregnant  breast-feeding How should I use this medicine? Take this medicine by mouth with a glass of water. Follow the directions on the prescription label. Take your medicine at regular intervals. Do not take it more often than directed. Do not stop taking except on your doctor's advice. A special MedGuide will be given to you by the pharmacist with each prescription and refill. Be sure to read this information carefully each time. Talk to your pediatrician regarding the use of this medicine in children. While this drug may be used in children as young as 12 years for selected conditions, precautions do apply. Overdosage: If you think you have taken too much of this medicine contact a poison control center or emergency room at once. NOTE: This medicine is only for you. Do not share this medicine with others. What if I miss a dose? If you miss a dose, take it as soon as you can. If it is almost time for your next dose, take only that dose. Do not take double or extra doses. What may interact with this medicine? Do not take this medicine with any of the following medications:  narcotic medicines for  cough  sodium oxybate This medicine may also interact with the following medications:  alcohol  antihistamines for allergy, cough and cold  certain medicines for anxiety or sleep  certain medicines for depression, like amitriptyline, fluoxetine, sertraline  certain medicines for seizures like carbamazepine, phenobarbital, phenytoin, primidone  general anesthetics like lidocaine, pramoxine, tetracaine  MAOIs like Carbex, Eldepryl, Marplan, Nardil, and Parnate  medicines that relax muscles for surgery  narcotic medicines for pain  phenothiazines like chlorpromazine, mesoridazine, prochlorperazine, thioridazine This list may not describe all possible interactions. Give your health care provider a list of all the medicines, herbs, non-prescription drugs, or dietary supplements you use. Also tell them if you smoke, drink alcohol, or use illegal drugs. Some items may interact with your medicine. What should I watch for while using this medicine? Tell your doctor or health care professional if your symptoms do not start to get better or if they get worse. Do not stop taking except on your doctor's advice. You may develop a severe reaction. Your doctor will tell you how much medicine to take. You may get drowsy or dizzy. Do not drive, use machinery, or do anything that needs mental alertness until you know how this medicine affects you. To reduce the risk of dizzy and fainting spells, do not stand or sit up quickly, especially if you are an older patient. Alcohol may increase dizziness and drowsiness. Avoid alcoholic drinks. If you are taking another medicine that also causes drowsiness, you may have more side effects. Give your health care provider a  list of all medicines you use. Your doctor will tell you how much medicine to take. Do not take more medicine than directed. Call emergency for help if you have problems breathing or unusual sleepiness. What side effects may I notice from  receiving this medicine? Side effects that you should report to your doctor or health care professional as soon as possible:  allergic reactions like skin rash, itching or hives, swelling of the face, lips, or tongue  breathing problems  confusion  loss of balance or coordination  signs and symptoms of low blood pressure like dizziness; feeling faint or lightheaded, falls; unusually weak or tired  suicidal thoughts or other mood changes Side effects that usually do not require medical attention (report to your doctor or health care professional if they continue or are bothersome):  dizziness  headache  nausea, vomiting  tiredness This list may not describe all possible side effects. Call your doctor for medical advice about side effects. You may report side effects to FDA at 1-800-FDA-1088. Where should I keep my medicine? Keep out of the reach of children. This medicine can be abused. Keep your medicine in a safe place to protect it from theft. Do not share this medicine with anyone. Selling or giving away this medicine is dangerous and against the law. This medicine may cause accidental overdose and death if taken by other adults, children, or pets. Mix any unused medicine with a substance like cat litter or coffee grounds. Then throw the medicine away in a sealed container like a sealed bag or a coffee can with a lid. Do not use the medicine after the expiration date. Store at room temperature between 20 and 25 degrees C (68 and 77 degrees F). Protect from light. Keep container tightly closed. NOTE: This sheet is a summary. It may not cover all possible information. If you have questions about this medicine, talk to your doctor, pharmacist, or health care provider.  2020 Elsevier/Gold Standard (2014-10-07 15:54:27)  

## 2019-02-03 NOTE — Progress Notes (Signed)
Botox- 100 units x 2 vials Lot: [id number] Expiration: 02/2021 LOT: [id number] EXP: 09/2021 NDC: [id number]  Bacteriostatic 0.9% Sodium Chloride- 16mL total Lot: [id number] Expiration: 04/23/2019 NDC: [id number]  Dx: [id number] B/B

## 2019-02-03 NOTE — Progress Notes (Signed)
Consent Form Botulism Toxin Injection For Chronic Migraine  Our seconfbotox. +a, extremely well, >70% increase in frequency, the first time she has had a significant change in her botox, she only had 1 migraine as the botox was wearing off, exceptional. 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 Stressful times, she is having anxiety attacks, limited ativan, can refill if needed. Discussed side effects.   Reviewed orally with patient, additionally signature is on file:  Consent Form Botulism Toxin Injection For Chronic Migraine    Reviewed orally with patient, additionally signature is on file:  Botulism toxin has been approved by the Federal drug administration for treatment of chronic migraine. Botulism toxin does not cure chronic migraine and it may not be effective in some patients.  The administration of botulism toxin is accomplished by injecting a small amount of toxin into the muscles of the neck and head. Dosage must be titrated for each individual. Any benefits resulting from botulism toxin tend to wear off after 3 months with a repeat injection required if benefit is to be maintained. Injections are usually done every 3-4 months with maximum effect peak achieved by about 2 or 3 weeks. Botulism toxin is expensive and you should be sure of what costs you will incur resulting from the injection.  The side effects of botulism toxin use for chronic migraine may include:   -Transient, and usually mild, facial weakness with facial injections  -Transient, and usually mild, head or neck weakness with head/neck injections  -Reduction or loss of forehead facial animation due to forehead muscle weakness  -Eyelid drooping  -Dry eye  -Pain at the site of injection or bruising at the site of injection  -Double vision  -Potential unknown long term risks  Contraindications: You should not have  Botox if you are pregnant, nursing, allergic to albumin, have an infection, skin condition, or muscle weakness at the site of the injection, or have myasthenia gravis, Lambert-Eaton syndrome, or ALS.  It is also possible that as with any injection, there may be an allergic reaction or no effect from the medication. Reduced effectiveness after repeated injections is sometimes seen and rarely infection at the injection site may occur. All care will be taken to prevent these side effects. If therapy is given over a long time, atrophy and wasting in the muscle injected may occur. Occasionally the patient's become refractory to treatment because they develop antibodies to the toxin. In this event, therapy needs to be modified.  I have read the above information and consent to the administration of botulism toxin.    BOTOX PROCEDURE NOTE FOR MIGRAINE HEADACHE    Contraindications and precautions discussed with patient(above). Aseptic procedure was observed and patient tolerated procedure. Procedure performed by Dr. Georgia Dom  The condition has existed for more than 6 months, and pt does not have a diagnosis of ALS, Myasthenia Gravis or Lambert-Eaton Syndrome.  Risks and benefits of injections discussed and pt agrees to proceed with the procedure.  Written consent obtained  These injections are medically necessary. Pt  receives good benefits from these injections. These injections do not cause sedations or hallucinations which the oral therapies may cause.  Description of procedure:  The patient was placed in a sitting position. The standard protocol was used for Botox as follows, with 5 units of Botox injected at each site:   -Procerus muscle, midline injection  -  Corrugator muscle, bilateral injection  -Frontalis muscle, bilateral injection, with 2 sites each side, medial injection was performed in the upper one third of the frontalis muscle, in the region vertical from the medial inferior edge  of the superior orbital rim. The lateral injection was again in the upper one third of the forehead vertically above the lateral limbus of the cornea, 1.5 cm lateral to the medial injection site.  -Temporalis muscle injection, 4 sites, bilaterally. The first injection was 3 cm above the tragus of the ear, second injection site was 1.5 cm to 3 cm up from the first injection site in line with the tragus of the ear. The third injection site was 1.5-3 cm forward between the first 2 injection sites. The fourth injection site was 1.5 cm posterior to the second injection site.   -Occipitalis muscle injection, 3 sites, bilaterally. The first injection was done one half way between the occipital protuberance and the tip of the mastoid process behind the ear. The second injection site was done lateral and superior to the first, 1 fingerbreadth from the first injection. The third injection site was 1 fingerbreadth superiorly and medially from the first injection site.  -Cervical paraspinal muscle injection, 2 sites, bilateral knee first injection site was 1 cm from the midline of the cervical spine, 3 cm inferior to the lower border of the occipital protuberance. The second injection site was 1.5 cm superiorly and laterally to the first injection site.  -Trapezius muscle injection was performed at 3 sites, bilaterally. The first injection site was in the upper trapezius muscle halfway between the inflection point of the neck, and the acromion. The second injection site was one half way between the acromion and the first injection site. The third injection was done between the first injection site and the inflection point of the neck.   Will return for repeat injection in 3 months.   200 units of Botox was used, any Botox not injected was wasted. The patient tolerated the procedure well, there were no complications of the above procedure.

## 2019-03-02 ENCOUNTER — Other Ambulatory Visit: Payer: Self-pay | Admitting: *Deleted

## 2019-03-02 DIAGNOSIS — G4709 Other insomnia: Secondary | ICD-10-CM

## 2019-03-02 MED ORDER — PROGESTERONE MICRONIZED 100 MG PO CAPS: 100.0000 mg | ORAL_CAPSULE | Freq: Every evening | ORAL | 1 refills | Status: DC | PRN

## 2019-03-19 ENCOUNTER — Other Ambulatory Visit: Payer: Self-pay

## 2019-03-19 ENCOUNTER — Ambulatory Visit (INDEPENDENT_AMBULATORY_CARE_PROVIDER_SITE_OTHER): Payer: BC Managed Care – PPO | Admitting: Nurse Practitioner

## 2019-03-19 ENCOUNTER — Encounter: Payer: Self-pay | Admitting: Nurse Practitioner

## 2019-03-19 ENCOUNTER — Ambulatory Visit (INDEPENDENT_AMBULATORY_CARE_PROVIDER_SITE_OTHER): Payer: BC Managed Care – PPO

## 2019-03-19 VITALS — BP 130/86 | HR 86 | Temp 98.2°F | Ht 68.9 in | Wt 169.1 lb

## 2019-03-19 DIAGNOSIS — K5909 Other constipation: Secondary | ICD-10-CM | POA: Diagnosis not present

## 2019-03-19 DIAGNOSIS — N898 Other specified noninflammatory disorders of vagina: Secondary | ICD-10-CM | POA: Diagnosis not present

## 2019-03-19 DIAGNOSIS — F902 Attention-deficit hyperactivity disorder, combined type: Secondary | ICD-10-CM | POA: Diagnosis not present

## 2019-03-19 DIAGNOSIS — M545 Low back pain, unspecified: Secondary | ICD-10-CM

## 2019-03-19 LAB — POCT URINALYSIS DIP (CLINITEK)
Bilirubin, UA: NEGATIVE
Blood, UA: NEGATIVE
Glucose, UA: NEGATIVE mg/dL
Ketones, POC UA: NEGATIVE mg/dL
Leukocytes, UA: NEGATIVE
Nitrite, UA: NEGATIVE
POC PROTEIN,UA: NEGATIVE
Spec Grav, UA: >=1.03 — AB (ref 1.010–1.025)
Urobilinogen, UA: 0.2 E.U./dL
pH, UA: 6 (ref 5.0–8.0)

## 2019-03-19 MED ORDER — MELOXICAM 15 MG PO TABS: 15.0000 mg | ORAL_TABLET | Freq: Every day | ORAL | 3 refills | Status: DC

## 2019-03-19 MED ORDER — LACTULOSE 10 GM/15ML PO SOLN: 20.0000 g | Freq: Every day | ORAL | 0 refills | Status: DC

## 2019-03-19 MED ORDER — OXYCODONE-ACETAMINOPHEN 5-325 MG PO TABS: 1.0000 | ORAL_TABLET | Freq: Four times a day (QID) | ORAL | 0 refills | Status: AC | PRN

## 2019-03-19 MED ORDER — AMPHETAMINE-DEXTROAMPHET ER 15 MG PO CP24: 15.0000 mg | ORAL_CAPSULE | ORAL | 0 refills | Status: DC

## 2019-03-19 NOTE — Progress Notes (Signed)
Acute Office Visit  Subjective:    Patient ID: Lacey Jensen, female    DOB: 11-Sep-1965, 54 y.o.   MRN: [id number]  Chief Complaint  Patient presents with  . Back Pain    HPI Patient is in today for bilateral lower back pain that started approximately 3 days ago. The pain is worse with sitting (8/10) and bending at the waste, but feels best when standing. No known injury to the area. Heat and high dose ibuprofen have been somewhat helpful. She is unable to sleep well and wakes frequently from the pain. She has never had any pain or injury to the lower back. The symptoms started after intercourse with her husband and she feels it is possible this may have contributed to the pain.   She is currently taking a prednisone taper due to a sinus infection (day 8 of 21).   Vaginal Discharge She reports sudden onset of watery, pink tinged vaginal discharge post coitus. She noticed the discharge when wiping and shortly thereafter in the shower. The discharge has subsided and has not returned. She has been post menopausal for approximately 3 years. She does report deep penetration with intercourse.   Constipation Patient reports chronic constipation with abdominal bloating and distention approximately 3-4 days out of the week. She states this has been an ongoing problem. She has tried stool softeners and miralax with little relief  ADHD She has a long standing history of ADHD. She reports she takes her medication during the workweek but does not take medication over the weekends.  She reports the medication works well for her.  She denies inability to focus, decreased concentration, easily distractible while taking medication.  She reports sleeping well and good appetite.  She requests refill on this medication today.  Past Medical History:  Diagnosis Date  . Abnormal Pap smear    Age 70 and age 56  . Dysplasia of cervix    leep  . Headache(784.0)   . Immunotherapy     Past Surgical History:    Procedure Laterality Date  . A GYN surgery  2002  . btl      Family History  Problem Relation Age of Onset  . Migraines Son     Social History   Socioeconomic History  . Marital status: Married    Spouse name: Gwyndolyn Saxon  . Number of children: 2  . Years of education: Not on file  . Highest education level: Master's degree (e.g., MA, MS, MEng, MEd, MSW, MBA)  Occupational History  . Occupation: Art gallery manager  Tobacco Use  . Smoking status: Former Smoker    Quit date: 01/23/2004    Years since quitting: 15.1  . Smokeless tobacco: Never Used  Substance and Sexual Activity  . Alcohol use: Yes    Alcohol/week: 1.0 standard drinks    Types: 1 Standard drinks or equivalent per week  . Drug use: No  . Sexual activity: Yes    Birth control/protection: None    Comment: IN MENOPAUSE  Other Topics Concern  . Not on file  Social History Narrative   She was adopted so we do not have an extensive family history. She does yoga for exercise and drinks 2 caffeinated drinks per day.      Lives at home with her husband & 3 dogs   Right handed   Social Determinants of Health   Financial Resource Strain:   . Difficulty of Paying Living Expenses: Not on file  Food Insecurity:   .  Worried About Charity fundraiser in the Last Year: Not on file  . Ran Out of Food in the Last Year: Not on file  Transportation Needs:   . Lack of Transportation (Medical): Not on file  . Lack of Transportation (Non-Medical): Not on file  Physical Activity:   . Days of Exercise per Week: Not on file  . Minutes of Exercise per Session: Not on file  Stress:   . Feeling of Stress : Not on file  Social Connections:   . Frequency of Communication with Friends and Family: Not on file  . Frequency of Social Gatherings with Friends and Family: Not on file  . Attends Religious Services: Not on file  . Active Member of Clubs or Organizations: Not on file  . Attends Archivist Meetings: Not on file  .  Marital Status: Not on file  Intimate Partner Violence:   . Fear of Current or Ex-Partner: Not on file  . Emotionally Abused: Not on file  . Physically Abused: Not on file  . Sexually Abused: Not on file    Outpatient Medications Prior to Visit  Medication Sig Dispense Refill  . azelastine (ASTELIN) 0.1 % nasal spray U 1 SPR IEN BID    . botulinum toxin Type A (BOTOX) 100 units SOLR injection Inject 155 units into the muscles of the head and neck every 3 months. To be injected by the provider. Discard remainder. 200 Units 3  . cyclobenzaprine (FLEXERIL) 10 MG tablet Take 1 tablet (10 mg total) by mouth every 8 (eight) hours as needed for muscle spasms. 90 tablet 11  . FIBER PO Take by mouth.    Marland Kitchen FLUoxetine (PROZAC) 20 MG tablet TAKE 2 TABLETS BY MOUTH DAILY 180 tablet 1  . LORazepam (ATIVAN) 0.5 MG tablet Take 1 tablet (0.5 mg total) by mouth every 8 (eight) hours. 30 tablet 4  . MAGNESIUM PO Take by mouth.    . Probiotic Product (PROBIOTIC PO) Take by mouth.    . progesterone (PROMETRIUM) 100 MG capsule Take 1 capsule (100 mg total) by mouth at bedtime as needed. 30 capsule 1  . valACYclovir (VALTREX) 500 MG tablet TAKE 1 TABLET(500 MG) BY MOUTH TWICE DAILY 14 tablet 6  . amphetamine-dextroamphetamine (ADDERALL XR) 15 MG 24 hr capsule Take 1 capsule by mouth every morning. 30 capsule 0  . amphetamine-dextroamphetamine (ADDERALL XR) 15 MG 24 hr capsule Take 1 capsule by mouth every morning. 30 capsule 0  . amphetamine-dextroamphetamine (ADDERALL XR) 15 MG 24 hr capsule Take 1 capsule by mouth every morning. 30 capsule 0  . atenolol (TENORMIN) 25 MG tablet Take 1 tablet (25 mg total) by mouth daily. (Patient not taking: Reported on 03/19/2019) 30 tablet 11  . azithromycin (ZITHROMAX) 250 MG tablet Take 1 tablet by mouth daily.    . metoCLOPramide (REGLAN) 10 MG tablet Take 1 tablet (10 mg total) by mouth every 6 (six) hours as needed for nausea. Or for migraines. (Patient not taking: Reported  on 03/19/2019) 90 tablet 6  . predniSONE (STERAPRED UNI-PAK 21 TAB) 5 MG (21) TBPK tablet Take 1 tablet by mouth as directed.     No facility-administered medications prior to visit.    Allergies  Allergen Reactions  . Ambien [Zolpidem] Other (See Comments)    Sleep walking and memory loss  . Belsomra [Suvorexant] Other (See Comments)    Bad dreams, sleep walking  . Sumatriptan Succinate     REACTION: Made her feel high  .  Zolmitriptan     REACTION: Made her feel high    Review of Systems  Constitutional: Negative for chills, fatigue and fever.  Gastrointestinal: Positive for abdominal distention and constipation. Negative for blood in stool, diarrhea, nausea and rectal pain.  Genitourinary: Positive for frequency and vaginal discharge. Negative for decreased urine volume, difficulty urinating, genital sores, hematuria, menstrual problem, pelvic pain, urgency, vaginal bleeding and vaginal pain.  Musculoskeletal: Positive for back pain.  Skin: Negative for color change, rash and wound.  Neurological: Negative for weakness.  Psychiatric/Behavioral: Negative for decreased concentration and sleep disturbance. The patient is not nervous/anxious.        Objective:    Physical Exam Vitals and nursing note reviewed.  Constitutional:      Appearance: Normal appearance.  HENT:     Head: Normocephalic.  Cardiovascular:     Rate and Rhythm: Normal rate.  Pulmonary:     Effort: Pulmonary effort is normal.  Abdominal:     General: Bowel sounds are normal. There is no distension.     Palpations: Abdomen is soft.     Tenderness: There is no abdominal tenderness. There is no right CVA tenderness or left CVA tenderness.  Musculoskeletal:        General: Tenderness present.     Cervical back: Normal range of motion and neck supple. No tenderness.     Thoracic back: Spasms and tenderness present. No swelling, edema, deformity or signs of trauma. Decreased range of motion. No scoliosis.      Lumbar back: Tenderness present. No swelling, edema, deformity, signs of trauma or spasms. Decreased range of motion. Negative right straight leg raise test and negative left straight leg raise test.       Back:     Right lower leg: No edema.     Left lower leg: No edema.  Skin:    General: Skin is warm and dry.     Capillary Refill: Capillary refill takes less than 2 seconds.  Neurological:     General: No focal deficit present.     Mental Status: She is alert and oriented to person, place, and time.  Psychiatric:        Mood and Affect: Mood normal.        Behavior: Behavior normal.        Thought Content: Thought content normal.        Judgment: Judgment normal.     BP 130/86   Pulse 86   Temp 98.2 F (36.8 C) (Oral)   Ht 5' 8.9" (1.75 m)   Wt 169 lb 1.3 oz (76.7 kg)   LMP 11/23/2015   SpO2 95%   BMI 25.04 kg/m  Wt Readings from Last 3 Encounters:  03/19/19 169 lb 1.3 oz (76.7 kg)  06/04/18 169 lb (76.7 kg)  05/22/18 164 lb (74.4 kg)    Health Maintenance Due  Topic Date Due  . COLONOSCOPY  06/25/2015  . PAP SMEAR-Modifier  08/21/2018  . MAMMOGRAM  10/19/2018    There are no preventive care reminders to display for this patient.   Lab Results  Component Value Date   TSH 2.250 06/04/2018   Lab Results  Component Value Date   WBC 5.3 06/04/2018   HGB 13.1 06/04/2018   HCT 39.0 06/04/2018   MCV 89 06/04/2018   PLT 242 06/04/2018   Lab Results  Component Value Date   NA 140 06/04/2018   K 5.2 06/04/2018   CO2 26 06/04/2018   GLUCOSE 83  06/04/2018   BUN 14 06/04/2018   CREATININE 0.66 06/04/2018   BILITOT 0.3 06/04/2018   ALKPHOS 116 06/04/2018   AST 28 06/04/2018   ALT 27 06/04/2018   PROT 7.3 06/04/2018   ALBUMIN 4.7 06/04/2018   CALCIUM 10.1 06/04/2018   Lab Results  Component Value Date   CHOL 183 09/17/2012   Lab Results  Component Value Date   HDL 64 09/17/2012   Lab Results  Component Value Date   LDLCALC 101 (H) 09/17/2012    Lab Results  Component Value Date   TRIG 90 09/17/2012   Lab Results  Component Value Date   CHOLHDL 2.9 09/17/2012   No results found for: HGBA1C     Assessment & Plan:  1. Acute bilateral low back pain without sciatica Urinalysis negative for any acute findings.  Will obtain x-ray of lumbar spine to rule out spinal or disc involvement.  Highly suspect this is muscular strain. Patient provided with meloxicam.  Percocet provided for extremely limited use only when pain is unbearable.  Patient educated and expressed understanding. Once x-ray results have been received will determine if further evaluation is needed. Educated the patient on use of ice, heat, lidocaine patches, and gentle stretching exercises to help with pain. - POCT URINALYSIS DIP (CLINITEK) - oxyCODONE-acetaminophen (PERCOCET) 5-325 MG tablet; Take 1-2 tablets by mouth every 6 (six) hours as needed for up to 5 days for severe pain. Use sparingly to avoid tolerance/dependence  Dispense: 15 tablet; Refill: 0 - meloxicam (MOBIC) 15 MG tablet; Take 1 tablet (15 mg total) by mouth daily.  Dispense: 30 tablet; Refill: 3 - DG Lumbar Spine Complete  2. Chronic constipation Ongoing chronic constipation.  We will trial lactulose for 30 days especially in the setting of opiate pain management. Patient to let us know if this is effective.  Will consider for long-term option if so. - lactulose (CHRONULAC) 10 GM/15ML solution; Take 30 mLs (20 g total) by mouth daily.  Dispense: 946 mL; Refill: 0  3. Attention deficit hyperactivity disorder (ADHD), combined type Well-controlled ADHD with 15 mg of Adderall daily. Educated the patient on the importance of keeping medication safe and taking only as needed. Patient to follow up in approximately 3 months for refills. - amphetamine-dextroamphetamine (ADDERALL XR) 15 MG 24 hr capsule; Take 1 capsule by mouth every morning.  Dispense: 30 capsule; Refill: 0 - amphetamine-dextroamphetamine  (ADDERALL XR) 15 MG 24 hr capsule; Take 1 capsule by mouth every morning.  Dispense: 30 capsule; Refill: 0 - amphetamine-dextroamphetamine (ADDERALL XR) 15 MG 24 hr capsule; Take 1 capsule by mouth every morning.  Dispense: 30 capsule; Refill: 0  4. Vaginal discharge Vaginal discharge post coitus.  Symptoms limited and resolved at time of visit.  Patient instructed to contact the office if this recurs, she experiences bleeding, pain, or any concerning symptoms.   Orma Render, NP

## 2019-03-19 NOTE — Patient Instructions (Signed)
Ice or Heat can be used  Lidocaine patches can be used on the area of pain to help  Gentle stretching or yoga may help  Low Back Sprain or Strain Rehab Ask your health care provider which exercises are safe for you. Do exercises exactly as told by your health care provider and adjust them as directed. It is normal to feel mild stretching, pulling, tightness, or discomfort as you do these exercises. Stop right away if you feel sudden pain or your pain gets worse. Do not begin these exercises until told by your health care provider. Stretching and range-of-motion exercises These exercises warm up your muscles and joints and improve the movement and flexibility of your back. These exercises also help to relieve pain, numbness, and tingling. Lumbar rotation  1. Lie on your back on a firm surface and bend your knees. 2. Straighten your arms out to your sides so each arm forms a 90-degree angle (right angle) with a side of your body. 3. Slowly move (rotate) both of your knees to one side of your body until you feel a stretch in your lower back (lumbar). Try not to let your shoulders lift off the floor. 4. Hold this position for __________ seconds. 5. Tense your abdominal muscles and slowly move your knees back to the starting position. 6. Repeat this exercise on the other side of your body. Repeat __________ times. Complete this exercise __________ times a day. Single knee to chest  1. Lie on your back on a firm surface with both legs straight. 2. Bend one of your knees. Use your hands to move your knee up toward your chest until you feel a gentle stretch in your lower back and buttock. ? Hold your leg in this position by holding on to the front of your knee. ? Keep your other leg as straight as possible. 3. Hold this position for __________ seconds. 4. Slowly return to the starting position. 5. Repeat with your other leg. Repeat __________ times. Complete this exercise __________ times a  day. Prone extension on elbows  1. Lie on your abdomen on a firm surface (prone position). 2. Prop yourself up on your elbows. 3. Use your arms to help lift your chest up until you feel a gentle stretch in your abdomen and your lower back. ? This will place some of your body weight on your elbows. If this is uncomfortable, try stacking pillows under your chest. ? Your hips should stay down, against the surface that you are lying on. Keep your hip and back muscles relaxed. 4. Hold this position for __________ seconds. 5. Slowly relax your upper body and return to the starting position. Repeat __________ times. Complete this exercise __________ times a day. Strengthening exercises These exercises build strength and endurance in your back. Endurance is the ability to use your muscles for a long time, even after they get tired. Pelvic tilt This exercise strengthens the muscles that lie deep in the abdomen. 1. Lie on your back on a firm surface. Bend your knees and keep your feet flat on the floor. 2. Tense your abdominal muscles. Tip your pelvis up toward the ceiling and flatten your lower back into the floor. ? To help with this exercise, you may place a small towel under your lower back and try to push your back into the towel. 3. Hold this position for __________ seconds. 4. Let your muscles relax completely before you repeat this exercise. Repeat __________ times. Complete this exercise __________ times  a day. Alternating arm and leg raises  1. Get on your hands and knees on a firm surface. If you are on a hard floor, you may want to use padding, such as an exercise mat, to cushion your knees. 2. Line up your arms and legs. Your hands should be directly below your shoulders, and your knees should be directly below your hips. 3. Lift your left leg behind you. At the same time, raise your right arm and straighten it in front of you. ? Do not lift your leg higher than your hip. ? Do not lift  your arm higher than your shoulder. ? Keep your abdominal and back muscles tight. ? Keep your hips facing the ground. ? Do not arch your back. ? Keep your balance carefully, and do not hold your breath. 4. Hold this position for __________ seconds. 5. Slowly return to the starting position. 6. Repeat with your right leg and your left arm. Repeat __________ times. Complete this exercise __________ times a day. Abdominal set with straight leg raise  1. Lie on your back on a firm surface. 2. Bend one of your knees and keep your other leg straight. 3. Tense your abdominal muscles and lift your straight leg up, 4-6 inches (10-15 cm) off the ground. 4. Keep your abdominal muscles tight and hold this position for __________ seconds. ? Do not hold your breath. ? Do not arch your back. Keep it flat against the ground. 5. Keep your abdominal muscles tense as you slowly lower your leg back to the starting position. 6. Repeat with your other leg. Repeat __________ times. Complete this exercise __________ times a day. Single leg lower with bent knees 1. Lie on your back on a firm surface. 2. Tense your abdominal muscles and lift your feet off the floor, one foot at a time, so your knees and hips are bent in 90-degree angles (right angles). ? Your knees should be over your hips and your lower legs should be parallel to the floor. 3. Keeping your abdominal muscles tense and your knee bent, slowly lower one of your legs so your toe touches the ground. 4. Lift your leg back up to return to the starting position. ? Do not hold your breath. ? Do not let your back arch. Keep your back flat against the ground. 5. Repeat with your other leg. Repeat __________ times. Complete this exercise __________ times a day. Posture and body mechanics Good posture and healthy body mechanics can help to relieve stress in your body's tissues and joints. Body mechanics refers to the movements and positions of your body  while you do your daily activities. Posture is part of body mechanics. Good posture means:  Your spine is in its natural S-curve position (neutral).  Your shoulders are pulled back slightly.  Your head is not tipped forward. Follow these guidelines to improve your posture and body mechanics in your everyday activities. Standing   When standing, keep your spine neutral and your feet about hip width apart. Keep a slight bend in your knees. Your ears, shoulders, and hips should line up.  When you do a task in which you stand in one place for a long time, place one foot up on a stable object that is 2-4 inches (5-10 cm) high, such as a footstool. This helps keep your spine neutral. Sitting   When sitting, keep your spine neutral and keep your feet flat on the floor. Use a footrest, if necessary, and keep your  thighs parallel to the floor. Avoid rounding your shoulders, and avoid tilting your head forward.  When working at a desk or a computer, keep your desk at a height where your hands are slightly lower than your elbows. Slide your chair under your desk so you are close enough to maintain good posture.  When working at a computer, place your monitor at a height where you are looking straight ahead and you do not have to tilt your head forward or downward to look at the screen. Resting  When lying down and resting, avoid positions that are most painful for you.  If you have pain with activities such as sitting, bending, stooping, or squatting, lie in a position in which your body does not bend very much. For example, avoid curling up on your side with your arms and knees near your chest (fetal position).  If you have pain with activities such as standing for a long time or reaching with your arms, lie with your spine in a neutral position and bend your knees slightly. Try the following positions: ? Lying on your side with a pillow between your knees. ? Lying on your back with a pillow  under your knees. Lifting   When lifting objects, keep your feet at least shoulder width apart and tighten your abdominal muscles.  Bend your knees and hips and keep your spine neutral. It is important to lift using the strength of your legs, not your back. Do not lock your knees straight out.  Always ask for help to lift heavy or awkward objects. This information is not intended to replace advice given to you by your health care provider. Make sure you discuss any questions you have with your health care provider. Document Revised: 05/02/2018 Document Reviewed: 01/30/2018 Elsevier Patient Education  [employer].

## 2019-03-23 ENCOUNTER — Telehealth: Payer: Self-pay | Admitting: Nurse Practitioner

## 2019-03-23 ENCOUNTER — Telehealth: Payer: Self-pay

## 2019-03-23 ENCOUNTER — Other Ambulatory Visit: Payer: Self-pay | Admitting: Nurse Practitioner

## 2019-03-23 DIAGNOSIS — M8580 Other specified disorders of bone density and structure, unspecified site: Secondary | ICD-10-CM

## 2019-03-23 NOTE — Telephone Encounter (Signed)
Pt aware and agreeable to plan. No further questions or concerns at this time.

## 2019-03-23 NOTE — Telephone Encounter (Signed)
This very well could be related to her worsening constipation. I recommend that she drink 1 bottle of magnesium citrate (available at any pharmacy over the counter) divided into 2 doses 2 hours apart. This should promote her to have multiple bowel movements and help to clear out some of the stool burden. She needs to be prepared to stay near the toilet for several hours after drinking the medication. It can begin working in as little as 30 minutes.  If she does not have any relief after she has completed the clean out, then let us know.

## 2019-03-23 NOTE — Telephone Encounter (Signed)
Please call the patient and let her know that her most recent X-ray showed osteopenia. I have placed an order for a DEXA scan to measure her bone density. We typically do not start monitoring bone density until age 54, but with the xray findings I would like to double check this. Once we have those results back we can better determine our plan to proceed.

## 2019-03-23 NOTE — Telephone Encounter (Signed)
Patient advised of recommendations.  

## 2019-03-23 NOTE — Telephone Encounter (Signed)
Lacey Jensen woke in the middle of the night with worsening of the back pain. She has not had any normal bowel movements. She has not tried any OTC stools softeners or laxatives. She reports she only drinks 16 oz of coffee daily. Please advise.

## 2019-03-24 ENCOUNTER — Encounter: Payer: Self-pay | Admitting: Nurse Practitioner

## 2019-03-24 NOTE — Telephone Encounter (Signed)
Lacey Jensen states she is doing so much better today. She said "you have no idea". She was able to pass the stool and now has almost complete relief. She states she was half tempted to send Lacey Jensen flowers.

## 2019-03-25 ENCOUNTER — Other Ambulatory Visit: Payer: Self-pay

## 2019-03-25 ENCOUNTER — Ambulatory Visit (INDEPENDENT_AMBULATORY_CARE_PROVIDER_SITE_OTHER): Payer: BC Managed Care – PPO

## 2019-03-25 DIAGNOSIS — M8580 Other specified disorders of bone density and structure, unspecified site: Secondary | ICD-10-CM | POA: Diagnosis not present

## 2019-04-10 ENCOUNTER — Ambulatory Visit (INDEPENDENT_AMBULATORY_CARE_PROVIDER_SITE_OTHER): Payer: BC Managed Care – PPO | Admitting: Family Medicine

## 2019-04-10 ENCOUNTER — Encounter: Payer: Self-pay | Admitting: Family Medicine

## 2019-04-10 ENCOUNTER — Other Ambulatory Visit: Payer: Self-pay

## 2019-04-10 VITALS — BP 129/76 | HR 76 | Ht 69.0 in | Wt 173.0 lb

## 2019-04-10 DIAGNOSIS — M8580 Other specified disorders of bone density and structure, unspecified site: Secondary | ICD-10-CM

## 2019-04-10 DIAGNOSIS — Z1231 Encounter for screening mammogram for malignant neoplasm of breast: Secondary | ICD-10-CM | POA: Diagnosis not present

## 2019-04-10 DIAGNOSIS — Z1211 Encounter for screening for malignant neoplasm of colon: Secondary | ICD-10-CM | POA: Diagnosis not present

## 2019-04-10 DIAGNOSIS — M810 Age-related osteoporosis without current pathological fracture: Secondary | ICD-10-CM

## 2019-04-10 DIAGNOSIS — Z0000 Encounter for general adult medical examination without abnormal findings: Secondary | ICD-10-CM | POA: Diagnosis not present

## 2019-04-10 DIAGNOSIS — G4709 Other insomnia: Secondary | ICD-10-CM | POA: Diagnosis not present

## 2019-04-10 MED ORDER — PROGESTERONE MICRONIZED 100 MG PO CAPS: 100.0000 mg | ORAL_CAPSULE | Freq: Every evening | ORAL | 1 refills | Status: DC | PRN

## 2019-04-10 NOTE — Patient Instructions (Signed)

## 2019-04-10 NOTE — Progress Notes (Signed)
Subjective:     Lacey Jensen is a 54 y.o. female and is here for a comprehensive physical exam. The patient reports no problems.  She is doing of well overall she just got back from [place].  She is not exercising consistently but says she is planning to.  She was recently diagnosed with osteoporosis.  She was noted to have some osteopenia on x-ray and so DEXA scan was performed.  It did show that she had a significantly low T score of -2.7 in the osteoporosis range.  We will get some updated blood work just to rule out vitamin D deficiency thyroid disorder and check serum calcium levels.  Social History   Socioeconomic History  . Marital status: Married    Spouse name: Gwyndolyn Saxon  . Number of children: 2  . Years of education: Not on file  . Highest education level: Master's degree (e.g., MA, MS, MEng, MEd, MSW, MBA)  Occupational History  . Occupation: Art gallery manager  Tobacco Use  . Smoking status: Former Smoker    Quit date: 01/23/2004    Years since quitting: 15.2  . Smokeless tobacco: Never Used  Substance and Sexual Activity  . Alcohol use: Yes    Alcohol/week: 1.0 standard drinks    Types: 1 Standard drinks or equivalent per week  . Drug use: No  . Sexual activity: Yes    Birth control/protection: None    Comment: IN MEN[place]AUSE  Other Topics Concern  . Not on file  Social History Narrative   She was adopted so we do not have an extensive family history. She does yoga for exercise and drinks 2 caffeinated drinks per day.      Lives at home with her husband & 3 dogs   Right handed   Social Determinants of Health   Financial Resource Strain:   . Difficulty of Paying Living Expenses:   Food Insecurity:   . Worried About Charity fundraiser in the Last Year:   . Arboriculturist in the Last Year:   Transportation Needs:   . Film/video editor (Medical):   Marland Kitchen Lack of Transportation (Non-Medical):   Physical Activity:   . Days of Exercise per Week:   . Minutes of  Exercise per Session:   Stress:   . Feeling of Stress :   Social Connections:   . Frequency of Communication with Friends and Family:   . Frequency of Social Gatherings with Friends and Family:   . Attends Religious Services:   . Active Member of Clubs or Organizations:   . Attends Archivist Meetings:   Marland Kitchen Marital Status:   Intimate Partner Violence:   . Fear of Current or Ex-Partner:   . Emotionally Abused:   Marland Kitchen Physically Abused:   . Sexually Abused:    Health Maintenance  Topic Date Due  . COLONOSCOPY  Never done  . MAMMOGRAM  10/19/2018  . INFLUENZA VACCINE  04/22/2019 (Originally 08/23/2018)  . HIV Screening  10/13/2019 (Originally [birth year]/24/1980)  . PAP SMEAR-Modifier  10/18/2021 (Originally 08/21/2018)  . TETANUS/TDAP  12/03/2023    The following portions of the patient's history were reviewed and updated as appropriate: allergies, current medications, past family history, past medical history, past social history, past surgical history and problem list.  Review of Systems A comprehensive review of systems was negative.   Objective:    BP 129/76   Pulse 76   Ht 5\' 9"  (1.753 m)   Wt 173 lb (78.5  kg)   LMP 11/23/2015   SpO2 100%   BMI 25.55 kg/m  General appearance: alert, cooperative and appears stated age Head: Normocephalic, without obvious abnormality, atraumatic Eyes: conj clear, EOMI, PEERLA Ears: normal TM's and external ear canals both ears Nose: Nares normal. Septum midline. Mucosa normal. No drainage or sinus tenderness. Throat: lips, mucosa, and tongue normal; teeth and gums normal Neck: no adenopathy, no carotid bruit, no JVD, supple, symmetrical, trachea midline and thyroid not enlarged, symmetric, no tenderness/mass/nodules Back: symmetric, no curvature. ROM normal. No CVA tenderness. Lungs: clear to auscultation bilaterally Breasts: normal appearance, no masses or tenderness Heart: regular rate and rhythm, S1, S2 normal, no murmur, click, rub or  gallop Abdomen: soft, non-tender; bowel sounds normal; no masses,  no organomegaly Extremities: extremities normal, atraumatic, no cyanosis or edema Pulses: 2+ and symmetric Skin: Skin color, texture, turgor normal. No rashes or lesions Lymph nodes: Cervical, supraclavicular, and axillary nodes normal. Neurologic: Alert and oriented X 3, normal strength and tone. Normal symmetric reflexes. Normal coordination and gait    Assessment:    Healthy female exam.      Plan:     See After Visit Summary for Counseling Recommendations   Keep up a regular exercise program and make sure you are eating a healthy diet Try to eat 4 servings of dairy a day, or if you are lactose intolerant take a calcium with vitamin D daily.  Your vaccines are up to date.  Refer for screening colonoscopy and mammogram.  Osteoporosis -new diagnosis recommend additional lab work-up to rule out renal liver disease, hyperthyroidism, hyperparathyroidism, Cushing's syndrome or subclinical hypercortisolism, early menopause, celiac disease and other forms of malabsorption, idiopathic hypercalciuria, or, rarely, connective tissue disorders.

## 2019-04-13 ENCOUNTER — Encounter: Payer: Self-pay | Admitting: Gastroenterology

## 2019-04-17 ENCOUNTER — Encounter: Payer: Self-pay | Admitting: Family Medicine

## 2019-04-17 MED ORDER — LINACLOTIDE 145 MCG PO CAPS: 145.0000 ug | ORAL_CAPSULE | Freq: Every day | ORAL | 4 refills | Status: DC

## 2019-04-17 NOTE — Telephone Encounter (Signed)
Routing to provider. New rx pended for approval.

## 2019-04-28 ENCOUNTER — Telehealth: Payer: Self-pay | Admitting: *Deleted

## 2019-04-28 NOTE — Telephone Encounter (Signed)
I called [employer]  [phone] and spoke to Leona. She states that [phone] and [phone] are billable.  [id number] will require PA.  Ref# for call [id number].  I called [phone] and spoke to [place] and she confirmed that there is already a PA on file.  Ref# [id number] Valid from 02/02/2019-02/02/2020 for 4 visits.

## 2019-05-04 NOTE — Progress Notes (Addendum)
Consent Form Botulism Toxin Injection For Chronic Migraine  Our third botox. Absolutely lovely, hilarious Education officer, museum. +a, extremely well, >80% decrease in frequency, nothing has ever worked so well, the first time she has had a significant change in her botox, she only had 1 migraine as the botox was wearing off, exceptional. 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 Stressful times, she is having anxiety attacks, limited ativan, can refill if needed. Discussed side effects.   Reviewed orally with patient, additionally signature is on file:  Consent Form Botulism Toxin Injection For Chronic Migraine    Reviewed orally with patient, additionally signature is on file:  Botulism toxin has been approved by the Federal drug administration for treatment of chronic migraine. Botulism toxin does not cure chronic migraine and it may not be effective in some patients.  The administration of botulism toxin is accomplished by injecting a small amount of toxin into the muscles of the neck and head. Dosage must be titrated for each individual. Any benefits resulting from botulism toxin tend to wear off after 3 months with a repeat injection required if benefit is to be maintained. Injections are usually done every 3-4 months with maximum effect peak achieved by about 2 or 3 weeks. Botulism toxin is expensive and you should be sure of what costs you will incur resulting from the injection.  The side effects of botulism toxin use for chronic migraine may include:   -Transient, and usually mild, facial weakness with facial injections  -Transient, and usually mild, head or neck weakness with head/neck injections  -Reduction or loss of forehead facial animation due to forehead muscle weakness  -Eyelid drooping  -Dry eye  -Pain at the site of injection or bruising at the site of injection  -Double  vision  -Potential unknown long term risks  Contraindications: You should not have Botox if you are pregnant, nursing, allergic to albumin, have an infection, skin condition, or muscle weakness at the site of the injection, or have myasthenia gravis, Lambert-Eaton syndrome, or ALS.  It is also possible that as with any injection, there may be an allergic reaction or no effect from the medication. Reduced effectiveness after repeated injections is sometimes seen and rarely infection at the injection site may occur. All care will be taken to prevent these side effects. If therapy is given over a long time, atrophy and wasting in the muscle injected may occur. Occasionally the patient's become refractory to treatment because they develop antibodies to the toxin. In this event, therapy needs to be modified.  I have read the above information and consent to the administration of botulism toxin.    BOTOX PROCEDURE NOTE FOR MIGRAINE HEADACHE    Contraindications and precautions discussed with patient(above). Aseptic procedure was observed and patient tolerated procedure. Procedure performed by Dr. Georgia Dom  The condition has existed for more than 6 months, and pt does not have a diagnosis of ALS, Myasthenia Gravis or Lambert-Eaton Syndrome.  Risks and benefits of injections discussed and pt agrees to proceed with the procedure.  Written consent obtained  These injections are medically necessary. Pt  receives good benefits from these injections. These injections do not cause sedations or hallucinations which the oral therapies may cause.  Description of procedure:  The patient was placed in a sitting position. The standard protocol was used for Botox as follows, with 5 units of  Botox injected at each site:   -Procerus muscle, midline injection  -Corrugator muscle, bilateral injection  -Frontalis muscle, bilateral injection, with 2 sites each side, medial injection was performed in the upper  one third of the frontalis muscle, in the region vertical from the medial inferior edge of the superior orbital rim. The lateral injection was again in the upper one third of the forehead vertically above the lateral limbus of the cornea, 1.5 cm lateral to the medial injection site.  -Temporalis muscle injection, 4 sites, bilaterally. The first injection was 3 cm above the tragus of the ear, second injection site was 1.5 cm to 3 cm up from the first injection site in line with the tragus of the ear. The third injection site was 1.5-3 cm forward between the first 2 injection sites. The fourth injection site was 1.5 cm posterior to the second injection site.   -Occipitalis muscle injection, 3 sites, bilaterally. The first injection was done one half way between the occipital protuberance and the tip of the mastoid process behind the ear. The second injection site was done lateral and superior to the first, 1 fingerbreadth from the first injection. The third injection site was 1 fingerbreadth superiorly and medially from the first injection site.  -Cervical paraspinal muscle injection, 2 sites, bilateral knee first injection site was 1 cm from the midline of the cervical spine, 3 cm inferior to the lower border of the occipital protuberance. The second injection site was 1.5 cm superiorly and laterally to the first injection site.  -Trapezius muscle injection was performed at 3 sites, bilaterally. The first injection site was in the upper trapezius muscle halfway between the inflection point of the neck, and the acromion. The second injection site was one half way between the acromion and the first injection site. The third injection was done between the first injection site and the inflection point of the neck.   Will return for repeat injection in 3 months.   200 units of Botox was used, any Botox not injected was wasted. The patient tolerated the procedure well, there were no complications of the above  procedure.

## 2019-05-05 ENCOUNTER — Other Ambulatory Visit: Payer: Self-pay

## 2019-05-05 ENCOUNTER — Ambulatory Visit (INDEPENDENT_AMBULATORY_CARE_PROVIDER_SITE_OTHER): Payer: BC Managed Care – PPO | Admitting: Neurology

## 2019-05-05 VITALS — Temp 97.8°F

## 2019-05-05 DIAGNOSIS — G43711 Chronic migraine without aura, intractable, with status migrainosus: Secondary | ICD-10-CM | POA: Diagnosis not present

## 2019-05-05 DIAGNOSIS — G43011 Migraine without aura, intractable, with status migrainosus: Secondary | ICD-10-CM

## 2019-05-05 MED ORDER — LORAZEPAM 0.5 MG PO TABS: 0.5000 mg | ORAL_TABLET | Freq: Three times a day (TID) | ORAL | 4 refills | Status: DC

## 2019-05-05 NOTE — Progress Notes (Signed)
Botox- 100 units x 2 vials Lot: [id number] Expiration: 12/2021 NDC: [id number]  Bacteriostatic 0.9% Sodium Chloride- 63mL total Lot: [id number] Expiration: 07/23/2019 NDC: [id number]  Dx: [id number] B/B

## 2019-05-13 ENCOUNTER — Encounter: Payer: BC Managed Care – PPO | Admitting: Gastroenterology

## 2019-05-29 ENCOUNTER — Telehealth: Payer: Self-pay | Admitting: Family Medicine

## 2019-05-29 NOTE — Telephone Encounter (Signed)
Call pt:  Needs colon cancer screning. See what she would like to do. She is 3 years overdue. Can always do FIT test if not interest in scope, etc.

## 2019-05-29 NOTE — Telephone Encounter (Signed)
Lacey Jensen called back and states she is going to have a colonoscopy this summer.

## 2019-05-29 NOTE — Telephone Encounter (Signed)
LVM advising pt of recommendations and asked that she RTC to let us know how she would like to proceed with this.

## 2019-07-01 ENCOUNTER — Other Ambulatory Visit: Payer: Self-pay | Admitting: Neurology

## 2019-07-01 NOTE — Telephone Encounter (Signed)
Drug registry checked. She last refilled rx 05/05/19 #30 at [place], [place]. Last prescription sent 05/05/19 to this pharmacy #30 with 4 refills.  Pt last seen 05/05/19 and next follow up is scheduled for 08/05/19

## 2019-07-01 NOTE — Telephone Encounter (Signed)
Pt has called asking for her refill of  LORazepam (ATIVAN) 0.5 MG tablet  to [facility] @ [address], [place]

## 2019-07-01 NOTE — Telephone Encounter (Signed)
Called and LVM for pt to let her know refills are available at [facility] which is where Dr. Jaynee Eagles last send prescription. We cannot transfer controlled substances. Advised she can ask Dr. Jaynee Eagles to e-scribe next fill to one in [facility]. Asked her to call back if she has further questions/concerns.

## 2019-07-13 ENCOUNTER — Other Ambulatory Visit: Payer: Self-pay | Admitting: Family Medicine

## 2019-07-20 ENCOUNTER — Other Ambulatory Visit: Payer: Self-pay

## 2019-07-20 ENCOUNTER — Ambulatory Visit: Payer: BC Managed Care – PPO | Admitting: Physician Assistant

## 2019-07-20 ENCOUNTER — Encounter: Payer: Self-pay | Admitting: Physician Assistant

## 2019-07-20 VITALS — BP 152/89 | HR 87 | Ht 69.0 in | Wt 169.0 lb

## 2019-07-20 DIAGNOSIS — K645 Perianal venous thrombosis: Secondary | ICD-10-CM | POA: Diagnosis not present

## 2019-07-20 NOTE — Patient Instructions (Signed)
Ice and NSAIDs tonight.        Keep good and clean.  Surgical Procedures for Hemorrhoids, Care After This sheet gives you information about how to care for yourself after your procedure. Your health care provider may also give you more specific instructions. If you have problems or questions, contact your health care provider. What can I expect after the procedure? After the procedure, it is common to have:  Rectal pain.  Pain when you are having a bowel movement.  Slight rectal bleeding. This is more likely to happen with the first bowel movement after surgery. Follow these instructions at home: Medicines  Take over-the-counter and prescription medicines only as told by your health care provider.  If you were prescribed an antibiotic medicine, use it as told by your health care provider. Do not stop using the antibiotic even if your condition improves.  Ask your health care provider if the medicine prescribed to you requires you to avoid driving or using heavy machinery.  Use a stool softener or a bulk laxative as told by your health care provider. Eating and drinking  Follow instructions from your health care provider about what to eat or drink after your procedure.  You may need to take actions to prevent or treat constipation, such as: ? Drink enough fluid to keep your urine pale yellow. ? Take over-the-counter or prescription medicines. ? Eat foods that are high in fiber, such as beans, whole grains, and fresh fruits and vegetables. ? Limit foods that are high in fat and processed sugars, such as fried or sweet foods. Activity   Rest as told by your health care provider.  Avoid sitting for a long time without moving. Get up to take short walks every 1-2 hours. This is important to improve blood flow and breathing. Ask for help if you feel weak or unsteady.  Return to your normal activities as told by your health care provider. Ask your health care provider what activities  are safe for you.  Do not lift anything that is heavier than 10 lb (4.5 kg), or the limit that you are told, until your health care provider says that it is safe.  Do not strain to have a bowel movement.  Do not spend a long time sitting on the toilet. General instructions   Take warm sitz baths for 15-20 minutes, 2-3 times a day to relieve soreness or itching and to keep the rectal area clean.  Apply ice packs to the area to reduce swelling and pain.  Do not drive for 24 hours if you were given a sedative during your procedure.  Keep all follow-up visits as told by your health care provider. This is important. Contact a health care provider if:  Your pain medicine is not helping.  You have a fever or chills.  You have bad smelling drainage.  You have a lot of swelling.  You become constipated.  You have trouble passing urine. Get help right away if:  You have very bad rectal pain.  You have heavy bleeding from your rectum. Summary  After the procedure, it is common to have pain and slight rectal bleeding.  Take warm sitz baths for 15-20 minutes, 2-3 times a day to relieve soreness or itching and to keep the rectal area clean.  Avoid straining when having a bowel movement.  Eat foods that are high in fiber, such as beans, whole grains, and fresh fruits and vegetables.  Take over-the-counter and prescription medicines only as  told by your health care provider. This information is not intended to replace advice given to you by your health care provider. Make sure you discuss any questions you have with your health care provider. Document Revised: 06/25/2018 Document Reviewed: 11/26/2017 Elsevier Patient Education  2020 [employer].   Hemorrhoids Hemorrhoids are swollen veins in and around the rectum or anus. There are two types of hemorrhoids:  Internal hemorrhoids. These occur in the veins that are just inside the rectum. They may poke through to the outside and  become irritated and painful.  External hemorrhoids. These occur in the veins that are outside the anus and can be felt as a painful swelling or hard lump near the anus. Most hemorrhoids do not cause serious problems, and they can be managed with home treatments such as diet and lifestyle changes. If home treatments do not help the symptoms, procedures can be done to shrink or remove the hemorrhoids. What are the causes? This condition is caused by increased pressure in the anal area. This pressure may result from various things, including:  Constipation.  Straining to have a bowel movement.  Diarrhea.  Pregnancy.  Obesity.  Sitting for long periods of time.  Heavy lifting or other activity that causes you to strain.  Anal sex.  Riding a bike for a long period of time. What are the signs or symptoms? Symptoms of this condition include:  Pain.  Anal itching or irritation.  Rectal bleeding.  Leakage of stool (feces).  Anal swelling.  One or more lumps around the anus. How is this diagnosed? This condition can often be diagnosed through a visual exam. Other exams or tests may also be done, such as:  An exam that involves feeling the rectal area with a gloved hand (digital rectal exam).  An exam of the anal canal that is done using a small tube (anoscope).  A blood test, if you have lost a significant amount of blood.  A test to look inside the colon using a flexible tube with a camera on the end (sigmoidoscopy or colonoscopy). How is this treated? This condition can usually be treated at home. However, various procedures may be done if dietary changes, lifestyle changes, and other home treatments do not help your symptoms. These procedures can help make the hemorrhoids smaller or remove them completely. Some of these procedures involve surgery, and others do not. Common procedures include:  Rubber band ligation. Rubber bands are placed at the base of the hemorrhoids to  cut off their blood supply.  Sclerotherapy. Medicine is injected into the hemorrhoids to shrink them.  Infrared coagulation. A type of light energy is used to get rid of the hemorrhoids.  Hemorrhoidectomy surgery. The hemorrhoids are surgically removed, and the veins that supply them are tied off.  Stapled hemorrhoidopexy surgery. The surgeon staples the base of the hemorrhoid to the rectal wall. Follow these instructions at home: Eating and drinking   Eat foods that have a lot of fiber in them, such as whole grains, beans, nuts, fruits, and vegetables.  Ask your health care provider about taking products that have added fiber (fiber supplements).  Reduce the amount of fat in your diet. You can do this by eating low-fat dairy products, eating less red meat, and avoiding processed foods.  Drink enough fluid to keep your urine pale yellow. Managing pain and swelling   Take warm sitz baths for 20 minutes, 3-4 times a day to ease pain and discomfort. You may do this  in a bathtub or using a portable sitz bath that fits over the toilet.  If directed, apply ice to the affected area. Using ice packs between sitz baths may be helpful. ? Put ice in a plastic bag. ? Place a towel between your skin and the bag. ? Leave the ice on for 20 minutes, 2-3 times a day. General instructions  Take over-the-counter and prescription medicines only as told by your health care provider.  Use medicated creams or suppositories as told.  Get regular exercise. Ask your health care provider how much and what kind of exercise is best for you. In general, you should do moderate exercise for at least 30 minutes on most days of the week (150 minutes each week). This can include activities such as walking, biking, or yoga.  Go to the bathroom when you have the urge to have a bowel movement. Do not wait.  Avoid straining to have bowel movements.  Keep the anal area dry and clean. Use wet toilet paper or moist  towelettes after a bowel movement.  Do not sit on the toilet for long periods of time. This increases blood pooling and pain.  Keep all follow-up visits as told by your health care provider. This is important. Contact a health care provider if you have:  Increasing pain and swelling that are not controlled by treatment or medicine.  Difficulty having a bowel movement, or you are unable to have a bowel movement.  Pain or inflammation outside the area of the hemorrhoids. Get help right away if you have:  Uncontrolled bleeding from your rectum. Summary  Hemorrhoids are swollen veins in and around the rectum or anus.  Most hemorrhoids can be managed with home treatments such as diet and lifestyle changes.  Taking warm sitz baths can help ease pain and discomfort.  In severe cases, procedures or surgery can be done to shrink or remove the hemorrhoids. This information is not intended to replace advice given to you by your health care provider. Make sure you discuss any questions you have with your health care provider. Document Revised: 06/06/2018 Document Reviewed: 05/30/2017 Elsevier Patient Education  [place].

## 2019-07-20 NOTE — Progress Notes (Signed)
° °  Subjective:    Patient ID: Lacey Jensen, female    DOB: 1965/09/20, 54 y.o.   MRN: [id number]  HPI Patient is a 54 year old female with history of thrombosed hemorrhoids who presents to the clinic with a painful bleeding hemorrhoid today.  She has had this particular hemorrhoid for 2 to 3 days.  She is done sitz bath's, rectal creams without any relief.  The only relief she gets is with the lidocaine over-the-counter cream.  She is on Linzess and denies any constipation.  She is really not sure what brought this hemorrhoid on.  She has had to have them removed in the past.  She is here today to consider removal.  She has seen GI once before for removal as well.  She cannot get in with them today.  .. Active Ambulatory Problems    Diagnosis Date Noted   Attention deficit hyperactivity disorder (ADHD) 06/25/2008   Migraine without aura 09/08/2009   ALLERGIC RHINITIS 04/25/2009   History of abnormal Pap smear 06/18/2012   Insomnia 12/02/2013   Anxiety 06/05/2017   Chronic migraine without aura, with intractable migraine, so stated, with status migrainosus 06/04/2018   Cervical myofascial pain syndrome 06/04/2018   Bloating 10/13/2018   Chronic constipation 10/13/2018   Thrombosed hemorrhoids 07/21/2019   Resolved Ambulatory Problems    Diagnosis Date Noted   PMDD (premenstrual dysphoric disorder) 04/04/2010   EXCESSIVE MENSTRUAL BLEEDING 04/04/2010   ANKLE PAIN 10/18/2010   Toe fracture, right 10/21/2014   Muscle spasm 11/02/2016   Acute recurrent maxillary sinusitis 10/14/2017   Past Medical History:  Diagnosis Date   Abnormal Pap smear    Dysplasia of cervix    Headache(784.0)    Immunotherapy       Review of Systems  All other systems reviewed and are negative.      Objective:   Physical Exam Genitourinary:   Neurological:     Mental Status: She is alert.           Assessment & Plan:  Marland KitchenMarland KitchenNeytiri was seen today for rectal  pain.  Diagnoses and all orders for this visit:  Thrombosed hemorrhoids -     hydrocortisone (PROCTO-MED HC) 2.5 % rectal cream; Place 1 application rectally 2 (two) times daily.   Needs colonoscopy. Pt states she will schedule and does not need referral.   Incision Thrombosed Hemorrhoid Procedure Note  Pre-operative Diagnosis: Thrombosed external hemorrhoid  Post-operative Diagnosis: Thrombosed external hemorrhoid  Locations:painful  Indications: Painful external hemorrhoid. Explained surgical vs conservative treatment; surgical incision and drainage of clot usually works quickly to reduce pain and shorten healing time, but is uncomfortable to perform.  After discussion, the patient wishes to proceed with this procedure.  Anesthesia: Lidocaine 1% with epinephrine without added sodium bicarbonate  Procedure Details  After adequate anesthesia, an elliptical incision was made, and the visible clot was extruded with curved hemostat. This was well tolerated. Bulky dressing is applied.  Complications: none.  Plan: 1. Very frequent Sitz baths. 2. Colace tid prn and increase fluids to prevent constipation.   3. Call or return to clinic prn if these symptoms worsen or fail to improve as anticipated  Steroid hemorrhoid cream given to try. Ibuprofen 800mg  for pain as needed.

## 2019-07-21 ENCOUNTER — Telehealth: Payer: Self-pay | Admitting: Physician Assistant

## 2019-07-21 DIAGNOSIS — Z1211 Encounter for screening for malignant neoplasm of colon: Secondary | ICD-10-CM

## 2019-07-21 DIAGNOSIS — K649 Unspecified hemorrhoids: Secondary | ICD-10-CM | POA: Insufficient documentation

## 2019-07-21 DIAGNOSIS — Z8719 Personal history of other diseases of the digestive system: Secondary | ICD-10-CM

## 2019-07-21 MED ORDER — HYDROCORTISONE (PERIANAL) 2.5 % EX CREA: 1.0000 "application " | TOPICAL_CREAM | Freq: Two times a day (BID) | CUTANEOUS | 1 refills | Status: DC

## 2019-07-21 NOTE — Telephone Encounter (Signed)
Patient did say she planned to have done this summer. Will send updated referral, last was placed in 2019.

## 2019-07-21 NOTE — Telephone Encounter (Signed)
Pt does need colonoscopy. Confirm if she needs appt/referral they could also follow up on hemorrhoids at that time.

## 2019-08-04 NOTE — Progress Notes (Signed)
Consent Form Botulism Toxin Injection For Chronic Migraine  07/23/2019:  Absolutely lovely, hilarious Education officer, [employer]. +a, extremely well, >80% decrease in frequency, nothing has ever worked so well, the first time she has had a significant change in her botox, she only had 1 migraine as the botox was wearing off, exceptional. 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 Stressful times, she is having anxiety attacks, limited ativan, can refill if needed. Discussed side effects. +a  Reviewed orally with patient, additionally signature is on file:  Consent Form Botulism Toxin Injection For Chronic Migraine    Reviewed orally with patient, additionally signature is on file:  Botulism toxin has been approved by the Federal drug administration for treatment of chronic migraine. Botulism toxin does not cure chronic migraine and it may not be effective in some patients.  The administration of botulism toxin is accomplished by injecting a small amount of toxin into the muscles of the neck and head. Dosage must be titrated for each individual. Any benefits resulting from botulism toxin tend to wear off after 3 months with a repeat injection required if benefit is to be maintained. Injections are usually done every 3-4 months with maximum effect peak achieved by about 2 or 3 weeks. Botulism toxin is expensive and you should be sure of what costs you will incur resulting from the injection.  The side effects of botulism toxin use for chronic migraine may include:   -Transient, and usually mild, facial weakness with facial injections  -Transient, and usually mild, head or neck weakness with head/neck injections  -Reduction or loss of forehead facial animation due to forehead muscle weakness  -Eyelid drooping  -Dry eye  -Pain at the site of injection or bruising at the site of injection  -Double  vision  -Potential unknown long term risks  Contraindications: You should not have Botox if you are pregnant, nursing, allergic to albumin, have an infection, skin condition, or muscle weakness at the site of the injection, or have myasthenia gravis, Lambert-Eaton syndrome, or ALS.  It is also possible that as with any injection, there may be an allergic reaction or no effect from the medication. Reduced effectiveness after repeated injections is sometimes seen and rarely infection at the injection site may occur. All care will be taken to prevent these side effects. If therapy is given over a long time, atrophy and wasting in the muscle injected may occur. Occasionally the patient's become refractory to treatment because they develop antibodies to the toxin. In this event, therapy needs to be modified.  I have read the above information and consent to the administration of botulism toxin.    BOTOX PROCEDURE NOTE FOR MIGRAINE HEADACHE    Contraindications and precautions discussed with patient(above). Aseptic procedure was observed and patient tolerated procedure. Procedure performed by Dr. Georgia Dom  The condition has existed for more than 6 months, and pt does not have a diagnosis of ALS, Myasthenia Gravis or Lambert-Eaton Syndrome.  Risks and benefits of injections discussed and pt agrees to proceed with the procedure.  Written consent obtained  These injections are medically necessary. Pt  receives good benefits from these injections. These injections do not cause sedations or hallucinations which the oral therapies may cause.  Description of procedure:  The patient was placed in a sitting position. The standard protocol was used for Botox as follows, with 5 units of Botox  injected at each site:   -Procerus muscle, midline injection  -Corrugator muscle, bilateral injection  -Frontalis muscle, bilateral injection, with 2 sites each side, medial injection was performed in the upper  one third of the frontalis muscle, in the region vertical from the medial inferior edge of the superior orbital rim. The lateral injection was again in the upper one third of the forehead vertically above the lateral limbus of the cornea, 1.5 cm lateral to the medial injection site.  -Temporalis muscle injection, 4 sites, bilaterally. The first injection was 3 cm above the tragus of the ear, second injection site was 1.5 cm to 3 cm up from the first injection site in line with the tragus of the ear. The third injection site was 1.5-3 cm forward between the first 2 injection sites. The fourth injection site was 1.5 cm posterior to the second injection site.   -Occipitalis muscle injection, 3 sites, bilaterally. The first injection was done one half way between the occipital protuberance and the tip of the mastoid process behind the ear. The second injection site was done lateral and superior to the first, 1 fingerbreadth from the first injection. The third injection site was 1 fingerbreadth superiorly and medially from the first injection site.  -Cervical paraspinal muscle injection, 2 sites, bilateral knee first injection site was 1 cm from the midline of the cervical spine, 3 cm inferior to the lower border of the occipital protuberance. The second injection site was 1.5 cm superiorly and laterally to the first injection site.  -Trapezius muscle injection was performed at 3 sites, bilaterally. The first injection site was in the upper trapezius muscle halfway between the inflection point of the neck, and the acromion. The second injection site was one half way between the acromion and the first injection site. The third injection was done between the first injection site and the inflection point of the neck.   Will return for repeat injection in 3 months.   200 units of Botox was used, any Botox not injected was wasted. The patient tolerated the procedure well, there were no complications of the above  procedure.

## 2019-08-05 ENCOUNTER — Other Ambulatory Visit: Payer: Self-pay | Admitting: Neurology

## 2019-08-05 ENCOUNTER — Ambulatory Visit: Payer: BC Managed Care – PPO | Admitting: Neurology

## 2019-08-05 DIAGNOSIS — G43711 Chronic migraine without aura, intractable, with status migrainosus: Secondary | ICD-10-CM

## 2019-08-05 MED ORDER — CYCLOBENZAPRINE HCL 10 MG PO TABS: 10.0000 mg | ORAL_TABLET | Freq: Every day | ORAL | 3 refills | Status: DC

## 2019-08-05 NOTE — Progress Notes (Signed)
Botox- 200 units x 1 vial Lot: [id number] Expiration: 03/2022 NDC: 0023-3921-02  Bacteriostatic 0.9% Sodium Chloride- 4mL total Lot: [id number] Expiration: 10/22/2020 NDC: 0409-1966-02  Dx: G43.711 B/B  

## 2019-08-11 ENCOUNTER — Telehealth: Payer: Self-pay

## 2019-08-11 DIAGNOSIS — Z1211 Encounter for screening for malignant neoplasm of colon: Secondary | ICD-10-CM

## 2019-08-11 NOTE — Telephone Encounter (Signed)
Barnett Applebaum called and wanted to proceed with the Cologuard. Cologuard ordered.

## 2019-08-13 ENCOUNTER — Other Ambulatory Visit: Payer: Self-pay | Admitting: Family Medicine

## 2019-09-08 ENCOUNTER — Encounter: Payer: Self-pay | Admitting: Gastroenterology

## 2019-09-14 ENCOUNTER — Telehealth (INDEPENDENT_AMBULATORY_CARE_PROVIDER_SITE_OTHER): Payer: BC Managed Care – PPO | Admitting: Family Medicine

## 2019-09-14 ENCOUNTER — Other Ambulatory Visit: Payer: Self-pay

## 2019-09-14 ENCOUNTER — Encounter: Payer: Self-pay | Admitting: Family Medicine

## 2019-09-14 DIAGNOSIS — K5909 Other constipation: Secondary | ICD-10-CM

## 2019-09-14 DIAGNOSIS — K649 Unspecified hemorrhoids: Secondary | ICD-10-CM | POA: Diagnosis not present

## 2019-09-14 DIAGNOSIS — F902 Attention-deficit hyperactivity disorder, combined type: Secondary | ICD-10-CM

## 2019-09-14 MED ORDER — AMPHETAMINE-DEXTROAMPHET ER 15 MG PO CP24: 15.0000 mg | ORAL_CAPSULE | ORAL | 0 refills | Status: DC

## 2019-09-14 MED ORDER — GABAPENTIN 100 MG PO CAPS: 100.0000 mg | ORAL_CAPSULE | Freq: Every day | ORAL | 1 refills | Status: DC

## 2019-09-14 NOTE — Progress Notes (Signed)
Virtual Visit via Video Note  I connected with [name] on 09/14/19 at  2:40 PM EDT by a video enabled telemedicine application and verified that I am speaking with the correct person using two identifiers.   I discussed the limitations of evaluation and management by telemedicine and the availability of in person appointments. The patient expressed understanding and agreed to proceed.  Patient location: Provider location: in office  Subjective:    CC: Hemorrhoids  HPI: Last seen in June by one of our PAs for thrombosed hemorrhoids.  She still has some prescription cream left over she said it started to flare again as she started to get constipated.  She says she really just feels like her bowels tend to fluctuate between constipation or diarrhea and every time it happens she has a flare with her hemorrhoids.  She says they are actually little better today.  But she is also concerned because she has been having some back pain.  Ports for about the last 2 weeks she has been having some persistent low back pain.  She says now it is actually a little better but she has been getting some tingling in the back of her knee and wonders if that still could be related.  She also has a history of restless leg syndrome.  She would like to try gabapentin she said over the summer she got to meet her biological mother who also has left this leg and does well with gabapentin.  She thinks she may have tried a different type of prescription years ago but does not member specifically what it was.  ADHD - Reports symptoms are well controlled on current regime. Denies any problems with insomnia, chest pain, palpitations, or SOB.  Typically comes off the medication in the summer since she does not work now that school has started back she would like to restart the medication.   Past medical history, Surgical history, Family history not pertinant except as noted below, Social history, Allergies, and  medications have been entered into the medical record, reviewed, and corrections made.   Review of Systems: No fevers, chills, night sweats, weight loss, chest pain, or shortness of breath.   Objective:    General: Speaking clearly in complete sentences without any shortness of breath.  Alert and oriented x3.  Normal judgment. No apparent acute distress.    Impression and Recommendations:    Attention deficit hyperactivity disorder (ADHD) Doing well on current regimen.  We will go ahead and send refills to pharmacy for 90 days.  Chronic constipation To work on high-fiber diet to keep the stools more soft.  Hemorrhoids He to keep stools soft as possible.  We can always refer to surgery if needed for more definitive treatment if she continues to have recurrence.  For now just using the topical cream.   Low back pain for a couple of weeks.  Difficult to say if this could be more musculoskeletal or may be even a disc issue now that she is getting a little bit of tingling behind one of her knees.  Discussed that it could be a little bit of radiculopathy going on.  Will actually be starting her on gabapentin for restless leg which may also help radicular symptoms for her low back.  If her back starts hurting again that I had like to see her in person for further work-up.   Time spent in encounter 22 minutes  I discussed the assessment and treatment plan with the patient.  The patient was provided an opportunity to ask questions and all were answered. The patient agreed with the plan and demonstrated an understanding of the instructions.   The patient was advised to call back or seek an in-person evaluation if the symptoms worsen or if the condition fails to improve as anticipated.   Lacey Lecher, MD

## 2019-09-14 NOTE — Assessment & Plan Note (Signed)
He to keep stools soft as possible.  We can always refer to surgery if needed for more definitive treatment if she continues to have recurrence.  For now just using the topical cream.

## 2019-09-14 NOTE — Assessment & Plan Note (Signed)
To work on high-fiber diet to keep the stools more soft.

## 2019-09-14 NOTE — Assessment & Plan Note (Signed)
Doing well on current regimen.  We will go ahead and send refills to pharmacy for 90 days.

## 2019-09-14 NOTE — Progress Notes (Signed)
Called pt and LVM advising that I was calling to do her prescreening.

## 2019-09-29 ENCOUNTER — Other Ambulatory Visit: Payer: Self-pay

## 2019-09-29 NOTE — Telephone Encounter (Signed)
Lacey Jensen wanted a refill on Bactroban, due to a sore in her nose. Pended Rx.

## 2019-09-30 ENCOUNTER — Other Ambulatory Visit: Payer: Self-pay

## 2019-09-30 MED ORDER — MUPIROCIN 2 % EX OINT: TOPICAL_OINTMENT | CUTANEOUS | 0 refills | Status: DC

## 2019-10-20 ENCOUNTER — Other Ambulatory Visit: Payer: Self-pay

## 2019-10-20 ENCOUNTER — Ambulatory Visit (AMBULATORY_SURGERY_CENTER): Payer: BC Managed Care – PPO

## 2019-10-20 VITALS — Ht 66.0 in | Wt 159.0 lb

## 2019-10-20 DIAGNOSIS — Z01818 Encounter for other preprocedural examination: Secondary | ICD-10-CM

## 2019-10-20 DIAGNOSIS — Z1211 Encounter for screening for malignant neoplasm of colon: Secondary | ICD-10-CM

## 2019-10-20 MED ORDER — SUTAB 1479-225-188 MG PO TABS: 12.0000 | ORAL_TABLET | ORAL | 0 refills | Status: DC

## 2019-10-20 NOTE — Progress Notes (Signed)
Patient's pre-visit was done today over the phone with the patient due to COVID-19 pandemic.   Name,DOB and address verified. Insurance verified. Packet of Prep instructions mailed to patient including copy of a consent form and pre-procedure patient acknowledgement form-pt is aware.   [employer] Coupon included. Patient understands to call us back with any questions or concerns.   COVID-19 screening test is on 11/05/19, the patient is aware.  Pt is aware that care partner will wait in the car during procedure; if they feel like they will be too hot or cold to wait in the car; they may wait in the 4 th floor lobby. Patient is aware to bring only one care partner. We want them to wear a mask (we do not have any that we can provide them), practice social distancing, and we will check their temperatures when they get here.  I did remind the patient that their care partner needs to stay in the parking lot the entire time and have a cell phone available, we will call them when the pt is ready for discharge. Patient will wear mask into building.  No allergies to soy or egg Pt is not on blood thinners or diet pills Denies issues with sedation/intubation Denies atrial flutter/fib Denies constipation   Emmi instructions given to pt  Pt is aware of Covid safety and care partner requirements.  159 lbs w/in 3 months-self report

## 2019-10-21 ENCOUNTER — Encounter: Payer: Self-pay | Admitting: Gastroenterology

## 2019-11-04 ENCOUNTER — Telehealth: Payer: Self-pay | Admitting: Neurology

## 2019-11-04 ENCOUNTER — Encounter: Payer: BC Managed Care – PPO | Admitting: Gastroenterology

## 2019-11-04 MED ORDER — BOTOX 100 UNITS IJ SOLR: INTRAMUSCULAR | 1 refills | Status: DC

## 2019-11-04 NOTE — Addendum Note (Signed)
Addended by: Gildardo Griffes on: 11/04/2019 02:27 PM   Modules accepted: Orders

## 2019-11-04 NOTE — Progress Notes (Signed)
Consent Form Botulism Toxin Injection For Chronic Migraine  11/05/2019:  Absolutely lovely, hilarious Education officer, [employer]. +a, doing extremely well, >80% decrease in frequency, nothing has ever worked so well, she has had a significant change in her botox, she only has about 1 migraine max a month and often only as the botox is wearing off, exceptional. 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 Stressful times, she is having anxiety attacks, limited ativan, can refill if needed. Discussed side effects. +a  Reviewed orally with patient, additionally signature is on file:  Consent Form Botulism Toxin Injection For Chronic Migraine    Reviewed orally with patient, additionally signature is on file:  Botulism toxin has been approved by the Federal drug administration for treatment of chronic migraine. Botulism toxin does not cure chronic migraine and it may not be effective in some patients.  The administration of botulism toxin is accomplished by injecting a small amount of toxin into the muscles of the neck and head. Dosage must be titrated for each individual. Any benefits resulting from botulism toxin tend to wear off after 3 months with a repeat injection required if benefit is to be maintained. Injections are usually done every 3-4 months with maximum effect peak achieved by about 2 or 3 weeks. Botulism toxin is expensive and you should be sure of what costs you will incur resulting from the injection.  The side effects of botulism toxin use for chronic migraine may include:   -Transient, and usually mild, facial weakness with facial injections  -Transient, and usually mild, head or neck weakness with head/neck injections  -Reduction or loss of forehead facial animation due to forehead muscle weakness  -Eyelid drooping  -Dry eye  -Pain at the site of injection or bruising at the site of  injection  -Double vision  -Potential unknown long term risks  Contraindications: You should not have Botox if you are pregnant, nursing, allergic to albumin, have an infection, skin condition, or muscle weakness at the site of the injection, or have myasthenia gravis, Lambert-Eaton syndrome, or ALS.  It is also possible that as with any injection, there may be an allergic reaction or no effect from the medication. Reduced effectiveness after repeated injections is sometimes seen and rarely infection at the injection site may occur. All care will be taken to prevent these side effects. If therapy is given over a long time, atrophy and wasting in the muscle injected may occur. Occasionally the patient's become refractory to treatment because they develop antibodies to the toxin. In this event, therapy needs to be modified.  I have read the above information and consent to the administration of botulism toxin.    BOTOX PROCEDURE NOTE FOR MIGRAINE HEADACHE    Contraindications and precautions discussed with patient(above). Aseptic procedure was observed and patient tolerated procedure. Procedure performed by Dr. Georgia Dom  The condition has existed for more than 6 months, and pt does not have a diagnosis of ALS, Myasthenia Gravis or Lambert-Eaton Syndrome.  Risks and benefits of injections discussed and pt agrees to proceed with the procedure.  Written consent obtained  These injections are medically necessary. Pt  receives good benefits from these injections. These injections do not cause sedations or hallucinations which the oral therapies may cause.  Description of procedure:  The patient was placed in a sitting position. The standard protocol was used for Botox as follows,  with 5 units of Botox injected at each site:   -Procerus muscle, midline injection  -Corrugator muscle, bilateral injection  -Frontalis muscle, bilateral injection, with 2 sites each side, medial injection was  performed in the upper one third of the frontalis muscle, in the region vertical from the medial inferior edge of the superior orbital rim. The lateral injection was again in the upper one third of the forehead vertically above the lateral limbus of the cornea, 1.5 cm lateral to the medial injection site.  -Temporalis muscle injection, 4 sites, bilaterally. The first injection was 3 cm above the tragus of the ear, second injection site was 1.5 cm to 3 cm up from the first injection site in line with the tragus of the ear. The third injection site was 1.5-3 cm forward between the first 2 injection sites. The fourth injection site was 1.5 cm posterior to the second injection site.   -Occipitalis muscle injection, 3 sites, bilaterally. The first injection was done one half way between the occipital protuberance and the tip of the mastoid process behind the ear. The second injection site was done lateral and superior to the first, 1 fingerbreadth from the first injection. The third injection site was 1 fingerbreadth superiorly and medially from the first injection site.  -Cervical paraspinal muscle injection, 2 sites, bilateral knee first injection site was 1 cm from the midline of the cervical spine, 3 cm inferior to the lower border of the occipital protuberance. The second injection site was 1.5 cm superiorly and laterally to the first injection site.  -Trapezius muscle injection was performed at 3 sites, bilaterally. The first injection site was in the upper trapezius muscle halfway between the inflection point of the neck, and the acromion. The second injection site was one half way between the acromion and the first injection site. The third injection was done between the first injection site and the inflection point of the neck.   Will return for repeat injection in 3 months.   200 units of Botox was used, any Botox not injected was wasted. The patient tolerated the procedure well, there were no  complications of the above procedure.

## 2019-11-04 NOTE — Telephone Encounter (Signed)
Patient has PA on file with [employer] for Botox that expires on 02/02/20. I filled out new PA form for [employer] to add [facility].   Romelle Starcher, can you please send patient's Botox prescription to [facility]?

## 2019-11-05 ENCOUNTER — Other Ambulatory Visit: Payer: Self-pay | Admitting: Gastroenterology

## 2019-11-05 ENCOUNTER — Ambulatory Visit: Payer: BC Managed Care – PPO | Admitting: Neurology

## 2019-11-05 DIAGNOSIS — G43711 Chronic migraine without aura, intractable, with status migrainosus: Secondary | ICD-10-CM | POA: Diagnosis not present

## 2019-11-05 NOTE — Progress Notes (Signed)
Botox- 200 units x 1 vial Lot: [id number] Expiration: 04/2022 NDC: 4621-9471-25  Bacteriostatic 0.9% Sodium Chloride- 47mL total Lot: [id number] Expiration: 10/22/2020 NDC: 9090-3014-99  Dx: U92.493 B/B

## 2019-11-06 LAB — SARS CORONAVIRUS 2 (TAT 6-24 HRS): SARS Coronavirus 2: NEGATIVE

## 2019-11-09 NOTE — Telephone Encounter (Signed)
I called the patient and explained the specialty pharmacy process. She verbalized understanding. I gave her the contact information for [facility] and she states that she will call them today to start the process of getting her medication here for her January appointment. I have mailed her a packet with further details regarding the specialty pharmacy, as well as the Botox Savings Program SP card information.

## 2019-11-10 ENCOUNTER — Ambulatory Visit (AMBULATORY_SURGERY_CENTER): Payer: BC Managed Care – PPO | Admitting: Gastroenterology

## 2019-11-10 ENCOUNTER — Encounter: Payer: Self-pay | Admitting: Gastroenterology

## 2019-11-10 ENCOUNTER — Other Ambulatory Visit: Payer: Self-pay

## 2019-11-10 VITALS — BP 123/80 | HR 67 | Temp 97.8°F | Resp 18 | Ht 69.0 in | Wt 159.0 lb

## 2019-11-10 DIAGNOSIS — K635 Polyp of colon: Secondary | ICD-10-CM | POA: Diagnosis not present

## 2019-11-10 DIAGNOSIS — Z1211 Encounter for screening for malignant neoplasm of colon: Secondary | ICD-10-CM

## 2019-11-10 DIAGNOSIS — D120 Benign neoplasm of cecum: Secondary | ICD-10-CM

## 2019-11-10 MED ORDER — SODIUM CHLORIDE 0.9 % IV SOLN: 500.0000 mL | Freq: Once | INTRAVENOUS | Status: DC

## 2019-11-10 NOTE — Progress Notes (Signed)
Called to room to assist during endoscopic procedure.  Patient ID and intended procedure confirmed with present staff. Received instructions for my participation in the procedure from the performing physician.  

## 2019-11-10 NOTE — Progress Notes (Signed)
C.W. vital signs. 

## 2019-11-10 NOTE — Op Note (Addendum)
[facility] Patient Name: Lacey Jensen Procedure Date: 11/10/2019 9:41 AM MRN: [id number] Endoscopist: Jackquline Denmark , MD Age: 54 Referring MD:  Date of Birth: 24-May-1965 Gender: Female Account #: [id number] Procedure:                Colonoscopy Indications:              Screening for colorectal malignant neoplasm Medicines:                Monitored Anesthesia Care Procedure:                Pre-Anesthesia Assessment:                           - Prior to the procedure, a History and Physical                            was performed, and patient medications and                            allergies were reviewed. The patient's tolerance of                            previous anesthesia was also reviewed. The risks                            and benefits of the procedure and the sedation                            options and risks were discussed with the patient.                            All questions were answered, and informed consent                            was obtained. Prior Anticoagulants: The patient has                            taken no previous anticoagulant or antiplatelet                            agents. ASA Grade Assessment: II - A patient with                            mild systemic disease. After reviewing the risks                            and benefits, the patient was deemed in                            satisfactory condition to undergo the procedure.                           After obtaining informed consent, the colonoscope  was passed under direct vision. Throughout the                            procedure, the patient's blood pressure, pulse, and                            oxygen saturations were monitored continuously. The                            Colonoscope was introduced through the anus and                            advanced to the 2 cm into the ileum. The                            colonoscopy was performed  without difficulty. The                            patient tolerated the procedure well. The quality                            of the bowel preparation was good. The terminal                            ileum, ileocecal valve, appendiceal orifice, and                            rectum were photographed. Scope In: 9:50:10 AM Scope Out: 10:06:29 AM Scope Withdrawal Time: 0 hours 11 minutes 37 seconds  Total Procedure Duration: 0 hours 16 minutes 19 seconds  Findings:                 A 6 mm polyp was found in the cecum. The polyp was                            sessile. The polyp was removed with a cold snare.                            Resection and retrieval were complete.                           A few small-mouthed diverticula were found in the                            sigmoid colon.                           Non-bleeding internal hemorrhoids were found during                            retroflexion. The hemorrhoids were moderate.                           The terminal ileum appeared normal.  The exam was otherwise without abnormality on                            direct and retroflexion views. The colon was                            redundant. Complications:            No immediate complications. Estimated Blood Loss:     Estimated blood loss: none. Impression:               - One 6 mm polyp in the cecum, removed with a cold                            snare. Resected and retrieved.                           - Mild sigmoid diverticulosis.                           - Non-bleeding internal hemorrhoids.                           - The examined portion of the ileum was normal.                           - The examination was otherwise normal on direct                            and retroflexion views. Recommendation:           - Patient has a contact number available for                            emergencies. The signs and symptoms of potential                             delayed complications were discussed with the                            patient. Return to normal activities tomorrow.                            Written discharge instructions were provided to the                            patient.                           - High fiber diet.                           - Continue present medications.                           - Await pathology results.                           -  Repeat colonoscopy for surveillance based on                            pathology results.                           - Return to GI office PRN. Jackquline Denmark, MD 11/10/2019 10:11:44 AM This report has been signed electronically.

## 2019-11-10 NOTE — Progress Notes (Signed)
Pt's states no medical or surgical changes since previsit or office visit. 

## 2019-11-10 NOTE — Progress Notes (Signed)
PT taken to PACU. Monitors in place. VSS. Report given to RN. 

## 2019-11-10 NOTE — Patient Instructions (Signed)
HANDOUTS PROVIDED ON: Polyps, diverticulosis, high fiber diet and hemorrhoids  The polyps removed today have been sent for pathology.  The results can take 1-3 weeks to receive.  When your next colonoscopy should occur will be based on the pathology results.    You may resume your previous diet and medication schedule.  Thank you for allowing [place] to care for you today!!!   YOU HAD AN ENDOSCOPIC PROCEDURE TODAY AT [facility]:   Refer to the procedure report that was given to you for any specific questions about what was found during the examination.  If the procedure report does not answer your questions, please call your gastroenterologist to clarify.  If you requested that your care partner not be given the details of your procedure findings, then the procedure report has been included in a sealed envelope for you to review at your convenience later.  YOU SHOULD EXPECT: Some feelings of bloating in the abdomen. Passage of more gas than usual.  Walking can help get rid of the air that was put into your GI tract during the procedure and reduce the bloating. If you had a lower endoscopy (such as a colonoscopy or flexible sigmoidoscopy) you may notice spotting of blood in your stool or on the toilet paper. If you underwent a bowel prep for your procedure, you may not have a normal bowel movement for a few days.  Please Note:  You might notice some irritation and congestion in your nose or some drainage.  This is from the oxygen used during your procedure.  There is no need for concern and it should clear up in a day or so.  SYMPTOMS TO REPORT IMMEDIATELY:   Following lower endoscopy (colonoscopy or flexible sigmoidoscopy):  Excessive amounts of blood in the stool  Significant tenderness or worsening of abdominal pains  Swelling of the abdomen that is new, acute  Fever of 100F or higher   For urgent or emergent issues, a gastroenterologist can be reached at any hour by calling  [phone]. Do not use MyChart messaging for urgent concerns.    DIET:  We do recommend a small meal at first, but then you may proceed to your regular diet.  Drink plenty of fluids but you should avoid alcoholic beverages for 24 hours.  ACTIVITY:  You should plan to take it easy for the rest of today and you should NOT DRIVE or use heavy machinery until tomorrow (because of the sedation medicines used during the test).    FOLLOW UP: Our staff will call the number listed on your records 48-72 hours following your procedure to check on you and address any questions or concerns that you may have regarding the information given to you following your procedure. If we do not reach you, we will leave a message.  We will attempt to reach you two times.  During this call, we will ask if you have developed any symptoms of COVID 19. If you develop any symptoms (ie: fever, flu-like symptoms, shortness of breath, cough etc.) before then, please call [phone].  If you test positive for Covid 19 in the 2 weeks post procedure, please call and report this information to [place].    If any biopsies were taken you will be contacted by phone or by letter within the next 1-3 weeks.  Please call us at [phone] if you have not heard about the biopsies in 3 weeks.    SIGNATURES/CONFIDENTIALITY: You and/or your care partner have  signed paperwork which will be entered into your electronic medical record.  These signatures attest to the fact that that the information above on your After Visit Summary has been reviewed and is understood.  Full responsibility of the confidentiality of this discharge information lies with you and/or your care-partner.

## 2019-11-11 NOTE — Telephone Encounter (Signed)
Faxed signed PA form to [facility].

## 2019-11-12 ENCOUNTER — Telehealth: Payer: Self-pay

## 2019-11-12 NOTE — Telephone Encounter (Signed)
  Follow up Call-  Call back number 11/10/2019  Post procedure Call Back phone  # [phone]  Permission to leave phone message Yes  Some recent data might be hidden     Patient questions:  Do you have a fever, pain , or abdominal swelling? No. Pain Score  0 *  Have you tolerated food without any problems? Yes.    Have you been able to return to your normal activities? Yes.    Do you have any questions about your discharge instructions: Diet   No. Medications  No. Follow up visit  No.  Do you have questions or concerns about your Care? No.  Actions: * If pain score is 4 or above: No action needed, pain <4.  1. Have you developed a fever since your procedure? no  2.   Have you had an respiratory symptoms (SOB or cough) since your procedure? no  3.   Have you tested positive for COVID 19 since your procedure no  4.   Have you had any family members/close contacts diagnosed with the COVID 19 since your procedure?  no   If yes to any of these questions please route to Joylene John, RN and Joella Prince, RN

## 2019-11-12 NOTE — Telephone Encounter (Signed)
Received fax from [facility] stating that patient's prescription has been placed on hold because they have not been able to reach the patient. I submitted PA to [facility] yesterday. Once I receive Botox approval, I will call [facility] to begin the delivery process.

## 2019-11-16 NOTE — Telephone Encounter (Signed)
Received fax from [facility] stating that patient's Botox PA information would remain the same, but states specialty pharmacy has been added. PA #[id number] (02/02/19- 02/02/20). Patient's next Botox appointment is 1/18, so patient will need new PA for Botox before then. I called [facility] and spoke with Melissa to give PA information. I asked her if the prescription was still on hold. She states the hold has been taken off and patient may call to give consent for shipment.

## 2019-11-17 ENCOUNTER — Ambulatory Visit: Payer: BC Managed Care – PPO | Admitting: Neurology

## 2019-11-18 ENCOUNTER — Encounter: Payer: Self-pay | Admitting: Gastroenterology

## 2019-12-02 ENCOUNTER — Other Ambulatory Visit: Payer: Self-pay | Admitting: Family Medicine

## 2019-12-02 DIAGNOSIS — G4709 Other insomnia: Secondary | ICD-10-CM

## 2019-12-14 NOTE — Telephone Encounter (Signed)
Would the practice lose money on buy and bill for this patient?

## 2019-12-14 NOTE — Telephone Encounter (Signed)
Patient called in with concerns about the cost of using the specialty pharmacy. Patient states she was initially told by the pharmacy that her co-pay would be over $1000.00 for Botox. She was able to get assistance from the co-pay program which brought the cost down to a little over $200.00. Patient states she is still not able to afford this every 3 months. Can we proceed with this patient as B/B, or should other options be considered?

## 2019-12-15 NOTE — Telephone Encounter (Signed)
Angie,   Could you take a look into this?  Thank you

## 2019-12-15 NOTE — Telephone Encounter (Signed)
Per Angie, we will not lose money on this patient if we B/B.

## 2019-12-15 NOTE — Telephone Encounter (Signed)
Then proceed with buy and bill thanks

## 2019-12-15 NOTE — Telephone Encounter (Signed)
I called patient and LVM to let her know that we will proceed with Botox as B/B.

## 2019-12-22 ENCOUNTER — Telehealth (INDEPENDENT_AMBULATORY_CARE_PROVIDER_SITE_OTHER): Payer: BC Managed Care – PPO | Admitting: Nurse Practitioner

## 2019-12-22 ENCOUNTER — Other Ambulatory Visit: Payer: Self-pay

## 2019-12-22 ENCOUNTER — Encounter: Payer: Self-pay | Admitting: Nurse Practitioner

## 2019-12-22 VITALS — Temp 98.7°F

## 2019-12-22 DIAGNOSIS — R059 Cough, unspecified: Secondary | ICD-10-CM | POA: Diagnosis not present

## 2019-12-22 MED ORDER — BENZONATATE 200 MG PO CAPS: 200.0000 mg | ORAL_CAPSULE | Freq: Three times a day (TID) | ORAL | 0 refills | Status: DC | PRN

## 2019-12-22 MED ORDER — AZITHROMYCIN 250 MG PO TABS: ORAL_TABLET | ORAL | 0 refills | Status: DC

## 2019-12-22 MED ORDER — HYDROCODONE-HOMATROPINE 5-1.5 MG/5ML PO SYRP: 5.0000 mL | ORAL_SOLUTION | Freq: Three times a day (TID) | ORAL | 0 refills | Status: AC | PRN

## 2019-12-22 NOTE — Patient Instructions (Addendum)
I hope you get to feeling better very soon!  I have called in a prescription pill for cough that can be used during the day. This will not make you sleepy and should help with the coughing.   I have also called in a prescription cough syrup for night. This will decrease the cough at night and help you sleep, but will make you sleepy, so do not use this during the day unless you are able to stay home and rest.   I have also called in a Z-pack, hopefully this will help clear things up quickly.   If your symptoms get worse over the week or do not improve, please send me a mychart message and let me know.   You can take over the counter mucinex to help make your cough more productive and decrease mucous production.  You can continue to take ibuprofen and tylenol alternating every 4-6 hours for headache and pain.  I recommend a cool mis humidifier while you sleep to help with breathing while you rest.  Hot showers can also help open up the airways.- going from a hot shower (or just standing in the bathroom with the shower running and doors closed) then straight to outside or opening the freezer can sometimes help break up the mucous in the lungs and help it to come up easier.  Vicks vapo rub or other menthol products can be helpful.  Warm tea with honey can sooth the throat.  Steroid nasal spray such as azelastin or flonase can be helpful for symptoms, as well.

## 2019-12-22 NOTE — Progress Notes (Signed)
Virtual Video Visit via MyChart Note  I connected with  Lacey Jensen on 12/22/19 at  8:20 AM EST by the video enabled telemedicine application for , MyChart, and verified that I am speaking with the correct person using two identifiers.   I introduced myself as a Designer, jewellery with the practice. We discussed the limitations of evaluation and management by telemedicine and the availability of in person appointments. The patient expressed understanding and agreed to proceed.  Participating parties in this visit include: The patient and the nurse practitioner listed only The patient is: at home I am: in the office  Subjective:    CC:  Chief Complaint  Patient presents with  . Sore Throat    onset 2 days ago, cough, fatigue, hoarseness, woke up in the middle of the night with worsening cough, has been taking Tylenol and Nyquil with minimal relief    HPI: Lacey Jensen is a 54 y.o. y/o female presenting via [facility] today for symptoms of fatigue, sore throat, headache, and hoarseness that have been ongoing since last week..  She reports that her granddaughters were sick last week and she was with them and began developing symptoms shortly thereafter.  She reports her symptoms have worsened over the past 2 days.  Last night she reports her cough was bad enough to keep her up every 3 hours.  She also reports a decreased appetite.  She tells me that her cough feels like it could be productive however she is unable to get anything up and out of her lungs.  She has been alternating Tylenol and ibuprofen for the headache.  She has also been taking NyQuil at night to help with the cough however this has been ineffective for the past 2 days.  She denies sinus pain or pressure, nausea, vomiting, diarrhea, loss of taste, loss of smell, chest pain, wheezing, palpitations, dizziness.   Past medical history, Surgical history, Family history not pertinant except as noted below, Social history,  Allergies, and medications have been entered into the medical record, reviewed, and corrections made.   Review of Systems:  All review of systems negative except what is listed in the HPI   Objective:    General: She is ill appearing. Speaking clearly in complete sentences.   She is audibly congested.  She is hoarse.  She has an audible cough with very mild shortness of breath noted when speaking in long sentences.  An audible expiratory wheeze is intermittently present. Alert and oriented x3.   Normal judgment.  No apparent acute distress.   Impression and Recommendations:    1. Cough Cough of unknown origin since last week with progressively worsening symptoms. Most likely started off of viral etiology but given the increase of her symptoms over the last 2 days we will begin antibiotic therapy with azithromycin.  Prescription for Hycodan provided for bedtime with instructions that she may take this in the day additionally as long as she is able to rest and not driving. [facility] provided for daytime relief. Discussed over-the-counter medication treatments that may be beneficial including Mucinex, cool mist humidifier, warm tea with honey, and nasal sprays. Recommend follow-up if symptoms worsen or fail to improve. - HYDROcodone-homatropine (HYCODAN) 5-1.5 MG/5ML syrup; Take 5 mLs by mouth every 8 (eight) hours as needed for up to 5 days for cough.  Dispense: 60 mL; Refill: 0 - benzonatate (TESSALON) 200 MG capsule; Take 1 capsule (200 mg total) by mouth 3 (three) times daily  as needed for cough.  Dispense: 30 capsule; Refill: 0 - azithromycin (ZITHROMAX) 250 MG tablet; Take 2 tabs (500 mg) together on the first day, then 1 tab (250 mg) daily until prescription complete.  Dispense: 10 tablet; Refill: 0     I discussed the assessment and treatment plan with the patient. The patient was provided an opportunity to ask questions and all were answered. The patient  agreed with the plan and demonstrated an understanding of the instructions.   The patient was advised to call back or seek an in-person evaluation if the symptoms worsen or if the condition fails to improve as anticipated.  I provided 20 minutes of non-face-to-face interaction with this [facility] visit including intake, same-day documentation, and chart review.   Orma Render, NP

## 2019-12-24 ENCOUNTER — Other Ambulatory Visit: Payer: Self-pay | Admitting: Nurse Practitioner

## 2019-12-24 MED ORDER — ONDANSETRON 8 MG PO TBDP: 8.0000 mg | ORAL_TABLET | Freq: Three times a day (TID) | ORAL | 3 refills | Status: DC | PRN

## 2020-01-05 ENCOUNTER — Telehealth: Payer: Self-pay | Admitting: Neurology

## 2020-01-05 ENCOUNTER — Other Ambulatory Visit: Payer: Self-pay | Admitting: Nurse Practitioner

## 2020-01-05 DIAGNOSIS — J0140 Acute pansinusitis, unspecified: Secondary | ICD-10-CM

## 2020-01-05 DIAGNOSIS — G43009 Migraine without aura, not intractable, without status migrainosus: Secondary | ICD-10-CM

## 2020-01-05 MED ORDER — PREDNISONE 20 MG PO TABS: 40.0000 mg | ORAL_TABLET | Freq: Every day | ORAL | 0 refills | Status: DC

## 2020-01-05 NOTE — Telephone Encounter (Signed)
Patient has her next Botox appointment on 1/18. She has a PA on file with [employer] that will expire 1/11. I filled out [employer] continuation request and gave to MD to sign.

## 2020-01-11 NOTE — Telephone Encounter (Signed)
Received approval from [facility]. PA #[id number] (01/23/20- 01/21/21).

## 2020-01-27 ENCOUNTER — Other Ambulatory Visit: Payer: Self-pay | Admitting: Family Medicine

## 2020-02-09 ENCOUNTER — Other Ambulatory Visit: Payer: Self-pay | Admitting: *Deleted

## 2020-02-09 ENCOUNTER — Ambulatory Visit: Payer: Self-pay | Admitting: Neurology

## 2020-02-09 ENCOUNTER — Telehealth: Payer: Self-pay | Admitting: Neurology

## 2020-02-09 MED ORDER — FLUOXETINE HCL 20 MG PO TABS
40.0000 mg | ORAL_TABLET | Freq: Every day | ORAL | 1 refills | Status: DC
Start: 2020-02-09 — End: 2020-09-22

## 2020-02-09 NOTE — Telephone Encounter (Signed)
Patient called today to reschedule her Botox appointment this afternoon due to the icy road conditions. During our call, I noticed patient's insurance appears to be inactive, but she states she has the same insurance but a new card. She states she will send me the card information.

## 2020-02-16 NOTE — Telephone Encounter (Signed)
I called patient to follow up on insurance information. Patient did not answer and I was not able to LVM.

## 2020-02-22 ENCOUNTER — Ambulatory Visit: Payer: BC Managed Care – PPO | Admitting: Neurology

## 2020-02-22 ENCOUNTER — Other Ambulatory Visit: Payer: Self-pay

## 2020-02-22 DIAGNOSIS — G43711 Chronic migraine without aura, intractable, with status migrainosus: Secondary | ICD-10-CM

## 2020-02-22 MED ORDER — LORAZEPAM 0.5 MG PO TABS
0.5000 mg | ORAL_TABLET | Freq: Three times a day (TID) | ORAL | 5 refills | Status: DC
Start: 2020-02-22 — End: 2020-08-30

## 2020-02-22 NOTE — Progress Notes (Signed)
Consent Form Botulism Toxin Injection For Chronic Migraine  02/22/2020:  Absolutely lovely, hilarious Education officer, museum. +a, dstill oing extremely well, stable >80% decrease in frequency, nothing has ever worked so well, she has had a significant change in her botox, she only has about 1 migraine max a month and often only as the botox is wearing off, exceptional. 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 Stressful times, she is having anxiety attacks, limited ativan, can refill if needed. Discussed side effects. +a  Reviewed orally with patient, additionally signature is on file:  Consent Form Botulism Toxin Injection For Chronic Migraine    Reviewed orally with patient, additionally signature is on file:  Botulism toxin has been approved by the Federal drug administration for treatment of chronic migraine. Botulism toxin does not cure chronic migraine and it may not be effective in some patients.  The administration of botulism toxin is accomplished by injecting a small amount of toxin into the muscles of the neck and head. Dosage must be titrated for each individual. Any benefits resulting from botulism toxin tend to wear off after 3 months with a repeat injection required if benefit is to be maintained. Injections are usually done every 3-4 months with maximum effect peak achieved by about 2 or 3 weeks. Botulism toxin is expensive and you should be sure of what costs you will incur resulting from the injection.  The side effects of botulism toxin use for chronic migraine may include:   -Transient, and usually mild, facial weakness with facial injections  -Transient, and usually mild, head or neck weakness with head/neck injections  -Reduction or loss of forehead facial animation due to forehead muscle weakness  -Eyelid drooping  -Dry eye  -Pain at the site of injection or bruising at the site  of injection  -Double vision  -Potential unknown long term risks  Contraindications: You should not have Botox if you are pregnant, nursing, allergic to albumin, have an infection, skin condition, or muscle weakness at the site of the injection, or have myasthenia gravis, Lambert-Eaton syndrome, or ALS.  It is also possible that as with any injection, there may be an allergic reaction or no effect from the medication. Reduced effectiveness after repeated injections is sometimes seen and rarely infection at the injection site may occur. All care will be taken to prevent these side effects. If therapy is given over a long time, atrophy and wasting in the muscle injected may occur. Occasionally the patient's become refractory to treatment because they develop antibodies to the toxin. In this event, therapy needs to be modified.  I have read the above information and consent to the administration of botulism toxin.    BOTOX PROCEDURE NOTE FOR MIGRAINE HEADACHE    Contraindications and precautions discussed with patient(above). Aseptic procedure was observed and patient tolerated procedure. Procedure performed by Dr. Georgia Dom  The condition has existed for more than 6 months, and pt does not have a diagnosis of ALS, Myasthenia Gravis or Lambert-Eaton Syndrome.  Risks and benefits of injections discussed and pt agrees to proceed with the procedure.  Written consent obtained  These injections are medically necessary. Pt  receives good benefits from these injections. These injections do not cause sedations or hallucinations which the oral therapies may cause.  Description of procedure:  The patient was placed in a sitting position. The standard protocol was used for Botox  as follows, with 5 units of Botox injected at each site:   -Procerus muscle, midline injection  -Corrugator muscle, bilateral injection  -Frontalis muscle, bilateral injection, with 2 sites each side, medial injection was  performed in the upper one third of the frontalis muscle, in the region vertical from the medial inferior edge of the superior orbital rim. The lateral injection was again in the upper one third of the forehead vertically above the lateral limbus of the cornea, 1.5 cm lateral to the medial injection site.  -Temporalis muscle injection, 4 sites, bilaterally. The first injection was 3 cm above the tragus of the ear, second injection site was 1.5 cm to 3 cm up from the first injection site in line with the tragus of the ear. The third injection site was 1.5-3 cm forward between the first 2 injection sites. The fourth injection site was 1.5 cm posterior to the second injection site.   -Occipitalis muscle injection, 3 sites, bilaterally. The first injection was done one half way between the occipital protuberance and the tip of the mastoid process behind the ear. The second injection site was done lateral and superior to the first, 1 fingerbreadth from the first injection. The third injection site was 1 fingerbreadth superiorly and medially from the first injection site.  -Cervical paraspinal muscle injection, 2 sites, bilateral knee first injection site was 1 cm from the midline of the cervical spine, 3 cm inferior to the lower border of the occipital protuberance. The second injection site was 1.5 cm superiorly and laterally to the first injection site.  -Trapezius muscle injection was performed at 3 sites, bilaterally. The first injection site was in the upper trapezius muscle halfway between the inflection point of the neck, and the acromion. The second injection site was one half way between the acromion and the first injection site. The third injection was done between the first injection site and the inflection point of the neck.   Will return for repeat injection in 3 months.   200 units of Botox was used, any Botox not injected was wasted. The patient tolerated the procedure well, there were no  complications of the above procedure.

## 2020-02-22 NOTE — Progress Notes (Signed)
Botox- 200 units x 1 vial Lot: [id number] Expiration: 10/2022 NDC: 5009-3818-29  Bacteriostatic 0.9% Sodium Chloride- 4mL total Lot: [id number] Expiration: 02/22/2021 NDC: 6789-3810-17  Dx: P10.258 B/B

## 2020-04-04 ENCOUNTER — Other Ambulatory Visit: Payer: Self-pay | Admitting: Family Medicine

## 2020-04-06 ENCOUNTER — Telehealth (INDEPENDENT_AMBULATORY_CARE_PROVIDER_SITE_OTHER): Payer: BC Managed Care – PPO | Admitting: Family Medicine

## 2020-04-06 ENCOUNTER — Encounter: Payer: Self-pay | Admitting: Family Medicine

## 2020-04-06 DIAGNOSIS — F902 Attention-deficit hyperactivity disorder, combined type: Secondary | ICD-10-CM | POA: Diagnosis not present

## 2020-04-06 DIAGNOSIS — K649 Unspecified hemorrhoids: Secondary | ICD-10-CM

## 2020-04-06 DIAGNOSIS — F419 Anxiety disorder, unspecified: Secondary | ICD-10-CM

## 2020-04-06 DIAGNOSIS — K5909 Other constipation: Secondary | ICD-10-CM

## 2020-04-06 DIAGNOSIS — K645 Perianal venous thrombosis: Secondary | ICD-10-CM | POA: Diagnosis not present

## 2020-04-06 MED ORDER — HYDROCORTISONE (PERIANAL) 2.5 % EX CREA: 1.0000 "application " | TOPICAL_CREAM | Freq: Two times a day (BID) | CUTANEOUS | 4 refills | Status: DC

## 2020-04-06 MED ORDER — VALACYCLOVIR HCL 500 MG PO TABS
500.0000 mg | ORAL_TABLET | Freq: Two times a day (BID) | ORAL | 6 refills | Status: DC
Start: 2020-04-06 — End: 2021-04-13

## 2020-04-06 MED ORDER — LUBIPROSTONE 24 MCG PO CAPS: 24.0000 ug | ORAL_CAPSULE | Freq: Two times a day (BID) | ORAL | 3 refills | Status: DC

## 2020-04-06 MED ORDER — AMPHETAMINE-DEXTROAMPHET ER 15 MG PO CP24: 15.0000 mg | ORAL_CAPSULE | ORAL | 0 refills | Status: DC

## 2020-04-06 NOTE — Assessment & Plan Note (Signed)
D/C LInzess since not helpful. Trial of Amitiza. Consider GI referral if not improving.

## 2020-04-06 NOTE — Assessment & Plan Note (Signed)
Stable on current regimen. F/U  In 6 mo. Continue fluoxetine.

## 2020-04-06 NOTE — Progress Notes (Deleted)
Established Patient Office Visit  Subjective:  Patient ID: Lacey Jensen, female    DOB: [birth year]  Age: 55 y.o. MRN: [id number]  CC: No chief complaint on file.   HPI Lacey Jensen presents for   ADD - Reports symptoms are well controlled on current regime. Denies any problems with insomnia, chest pain, palpitations, or SOB.     Past Medical History:  Diagnosis Date  . Abnormal Pap smear    Age 80 and age 36  . Allergy    seasonal  . Dysplasia of cervix    leep  . Headache(784.0)   . Immunotherapy   . Osteoporosis     Past Surgical History:  Procedure Laterality Date  . A GYN surgery  2002  . btl      Family History  Problem Relation Age of Onset  . Migraines Son   . Colon cancer Neg Hx   . Colon polyps Neg Hx   . Esophageal cancer Neg Hx   . Rectal cancer Neg Hx   . Stomach cancer Neg Hx     Social History   Socioeconomic History  . Marital status: Married    Spouse name: Gwyndolyn Saxon  . Number of children: 2  . Years of education: Not on file  . Highest education level: Master's degree (e.g., MA, MS, MEng, MEd, MSW, MBA)  Occupational History  . Occupation: Art gallery manager  Tobacco Use  . Smoking status: Former Smoker    Quit date: 01/23/2004    Years since quitting: 16.2  . Smokeless tobacco: Never Used  Vaping Use  . Vaping Use: Never used  Substance and Sexual Activity  . Alcohol use: Yes    Alcohol/week: 1.0 standard drink    Types: 1 Standard drinks or equivalent per week  . Drug use: No  . Sexual activity: Yes    Birth control/protection: None    Comment: IN [place]  Other Topics Concern  . Not on file  Social History Narrative   She was adopted so we do not have an extensive family history. She does yoga for exercise and drinks 2 caffeinated drinks per day.      Lives at home with her husband & 3 dogs   Right handed   Social Determinants of Health   Financial Resource Strain: Not on file  Food Insecurity: Not on file   Transportation Needs: Not on file  Physical Activity: Not on file  Stress: Not on file  Social Connections: Not on file  Intimate Partner Violence: Not on file    Outpatient Medications Prior to Visit  Medication Sig Dispense Refill  . amphetamine-dextroamphetamine (ADDERALL XR) 15 MG 24 hr capsule Take 1 capsule by mouth every morning. 30 capsule 0  . azelastine (ASTELIN) 0.1 % nasal spray as needed.     . botulinum toxin Type A (BOTOX) 100 units SOLR injection Inject 155 units into the muscles of the head and neck every 3 months. To be injected by the provider. Discard remainder. 2 each 1  . CALCIUM-VITAMIN D PO Take 1 tablet by mouth daily.    . cyclobenzaprine (FLEXERIL) 10 MG tablet Take 1 tablet (10 mg total) by mouth at bedtime. 90 tablet 3  . FIBER PO Take by mouth.    Marland Kitchen FLUoxetine (PROZAC) 20 MG tablet Take 2 tablets (40 mg total) by mouth daily. 180 tablet 1  . gabapentin (NEURONTIN) 100 MG capsule TAKE 1 TO 3 CAPSULES(100 TO 300 MG) BY MOUTH AT BEDTIME 90  capsule 1  . hydrocortisone (PROCTO-MED HC) 2.5 % rectal cream Place 1 application rectally 2 (two) times daily. 30 g 1  . linaclotide (LINZESS) 145 MCG CAPS capsule Take 1 capsule (145 mcg total) by mouth daily before breakfast. 30 capsule 4  . LORazepam (ATIVAN) 0.5 MG tablet Take 1 tablet (0.5 mg total) by mouth every 8 (eight) hours. 30 tablet 5  . MAGNESIUM PO Take by mouth.     . meloxicam (MOBIC) 15 MG tablet Take 1 tablet (15 mg total) by mouth daily. 30 tablet 3  . ondansetron (ZOFRAN-ODT) 8 MG disintegrating tablet Take 1 tablet (8 mg total) by mouth every 8 (eight) hours as needed for nausea. 20 tablet 3  . Probiotic Product (PROBIOTIC PO) Take by mouth.     . progesterone (PROMETRIUM) 100 MG capsule TAKE 1 CAPSULE(100 MG) BY MOUTH AT BEDTIME AS NEEDED 90 capsule 0  . valACYclovir (VALTREX) 500 MG tablet TAKE 1 TABLET BY MOUTH TWICE DAILY 14 tablet 6   No facility-administered medications prior to visit.     Allergies  Allergen Reactions  . Ambien [Zolpidem] Other (See Comments)    Sleep walking and memory loss  . Belsomra [Suvorexant] Other (See Comments)    Bad dreams, sleep walking  . Sumatriptan Succinate     REACTION: Made her feel high  . Zolmitriptan     REACTION: Made her feel high    ROS Review of Systems    Objective:    Physical Exam  LMP 11/23/2015  Wt Readings from Last 3 Encounters:  11/10/19 159 lb (72.1 kg)  10/20/19 159 lb (72.1 kg)  07/20/19 169 lb (76.7 kg)     Health Maintenance Due  Topic Date Due  . COVID-19 Vaccine (2 - Pfizer 3-dose series) 12/25/2019    There are no preventive care reminders to display for this patient.  Lab Results  Component Value Date   TSH 2.250 06/04/2018   Lab Results  Component Value Date   WBC 5.3 06/04/2018   HGB 13.1 06/04/2018   HCT 39.0 06/04/2018   MCV 89 06/04/2018   PLT 242 06/04/2018   Lab Results  Component Value Date   NA 140 06/04/2018   K 5.2 06/04/2018   CO2 26 06/04/2018   GLUCOSE 83 06/04/2018   BUN 14 06/04/2018   CREATININE 0.66 06/04/2018   BILITOT 0.3 06/04/2018   ALKPHOS 116 06/04/2018   AST 28 06/04/2018   ALT 27 06/04/2018   PROT 7.3 06/04/2018   ALBUMIN 4.7 06/04/2018   CALCIUM 10.1 06/04/2018   Lab Results  Component Value Date   CHOL 183 09/17/2012   Lab Results  Component Value Date   HDL 64 09/17/2012   Lab Results  Component Value Date   LDLCALC 101 (H) 09/17/2012   Lab Results  Component Value Date   TRIG 90 09/17/2012   Lab Results  Component Value Date   CHOLHDL 2.9 09/17/2012   No results found for: HGBA1C    Assessment & Plan:   Problem List Items Addressed This Visit      Other   Attention deficit hyperactivity disorder (ADHD) - Primary   Anxiety      No orders of the defined types were placed in this encounter.   Follow-up: No follow-ups on file.    Beatrice Lecher, MD

## 2020-04-06 NOTE — Progress Notes (Signed)
Virtual Visit via Video Note  I connected with Lacey Jensen on 04/06/20 at 11:10 AM EDT by a video enabled telemedicine application and verified that I am speaking with the correct person using two identifiers.   I discussed the limitations of evaluation and management by telemedicine and the availability of in person appointments. The patient expressed understanding and agreed to proceed.  Patient location: in her car Provider location: in office  Subjective:    CC: F/U ADD  HPI: ADD - Reports symptoms are well controlled on current regime. Denies any problems with insomnia, chest pain, palpitations, or SOB.    She is still having problems with her hemorrhoids to the point of having to push them back in.  Has been drinking more water, cut out more sugar and has been exercising.  Still struggling with constipation.  Feels like the LInzess is not really helping. She even stopped it for a month to see if any difference.    Chronic constipation - says the Linzess isn't really helping.  Has inc fluids, exercise, etc.   Past medical history, Surgical history, Family history not pertinant except as noted below, Social history, Allergies, and medications have been entered into the medical record, reviewed, and corrections made.   Review of Systems: No fevers, chills, night sweats, weight loss, chest pain, or shortness of breath.   Objective:    General: Speaking clearly in complete sentences without any shortness of breath.  Alert and oriented x3.  Normal judgment. No apparent acute distress.    Impression and Recommendations:    Attention deficit hyperactivity disorder (ADHD) Well controlled. Continue current regimen. Follow up in  6 mo   Hemorrhoids Will refer to Gen Surg for consultation for definitive treatment.    Anxiety Stable on current regimen. F/U  In 6 mo. Continue fluoxetine.    Chronic constipation D/C LInzess since not helpful. Trial of Amitiza. Consider GI referral  if not improving.    Orders Placed This Encounter  Procedures  . Ambulatory referral to General Surgery    Referral Priority:   Routine    Referral Type:   Surgical    Referral Reason:   Specialty Services Required    Requested Specialty:   General Surgery    Number of Visits Requested:   1   Meds ordered this encounter  Medications  . amphetamine-dextroamphetamine (ADDERALL XR) 15 MG 24 hr capsule    Sig: Take 1 capsule by mouth every morning.    Dispense:  30 capsule    Refill:  0  . valACYclovir (VALTREX) 500 MG tablet    Sig: Take 1 tablet (500 mg total) by mouth 2 (two) times daily.    Dispense:  60 tablet    Refill:  6  . hydrocortisone (PROCTO-MED HC) 2.5 % rectal cream    Sig: Place 1 application rectally 2 (two) times daily.    Dispense:  30 g    Refill:  4    Pt will call when needed.  Marland Kitchen amphetamine-dextroamphetamine (ADDERALL XR) 15 MG 24 hr capsule    Sig: Take 1 capsule by mouth every morning.    Dispense:  30 capsule    Refill:  0  . lubiprostone (AMITIZA) 24 MCG capsule    Sig: Take 1 capsule (24 mcg total) by mouth 2 (two) times daily with a meal. Take with food and water    Dispense:  30 capsule    Refill:  3  . amphetamine-dextroamphetamine (ADDERALL XR) 15 MG  24 hr capsule    Sig: Take 1 capsule by mouth every morning.    Dispense:  30 capsule    Refill:  0     I discussed the assessment and treatment plan with the patient. The patient was provided an opportunity to ask questions and all were answered. The patient agreed with the plan and demonstrated an understanding of the instructions.   The patient was advised to call back or seek an in-person evaluation if the symptoms worsen or if the condition fails to improve as anticipated.   Beatrice Lecher, MD

## 2020-04-06 NOTE — Assessment & Plan Note (Signed)
Will refer to Gen Surg for consultation for definitive treatment.

## 2020-04-06 NOTE — Assessment & Plan Note (Signed)
Well controlled. Continue current regimen. Follow up in  6 mo  

## 2020-05-24 ENCOUNTER — Ambulatory Visit: Payer: Self-pay | Admitting: Neurology

## 2020-05-26 ENCOUNTER — Other Ambulatory Visit: Payer: Self-pay

## 2020-05-26 ENCOUNTER — Ambulatory Visit: Payer: BC Managed Care – PPO | Admitting: Neurology

## 2020-05-26 DIAGNOSIS — G43711 Chronic migraine without aura, intractable, with status migrainosus: Secondary | ICD-10-CM | POA: Diagnosis not present

## 2020-05-26 NOTE — Progress Notes (Signed)
Consent Form Botulism Toxin Injection For Chronic Migraine   Interval history 05/26/2020: Stable  02/22/2020:  Absolutely lovely, hilarious Education officer, [employer]. +a, dstill oing extremely well, stable >80% decrease in frequency, nothing has ever worked so well, she has had a significant change in her botox, she only has about 1 migraine max a month and often only as the botox is wearing off, exceptional. 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 Stressful times, she is having anxiety attacks, limited ativan, can refill if needed. Discussed side effects. +a  Reviewed orally with patient, additionally signature is on file:  Consent Form Botulism Toxin Injection For Chronic Migraine    Reviewed orally with patient, additionally signature is on file:  Botulism toxin has been approved by the Federal drug administration for treatment of chronic migraine. Botulism toxin does not cure chronic migraine and it may not be effective in some patients.  The administration of botulism toxin is accomplished by injecting a small amount of toxin into the muscles of the neck and head. Dosage must be titrated for each individual. Any benefits resulting from botulism toxin tend to wear off after 3 months with a repeat injection required if benefit is to be maintained. Injections are usually done every 3-4 months with maximum effect peak achieved by about 2 or 3 weeks. Botulism toxin is expensive and you should be sure of what costs you will incur resulting from the injection.  The side effects of botulism toxin use for chronic migraine may include:   -Transient, and usually mild, facial weakness with facial injections  -Transient, and usually mild, head or neck weakness with head/neck injections  -Reduction or loss of forehead facial animation due to forehead muscle weakness  -Eyelid drooping  -Dry eye  -Pain at the  site of injection or bruising at the site of injection  -Double vision  -Potential unknown long term risks  Contraindications: You should not have Botox if you are pregnant, nursing, allergic to albumin, have an infection, skin condition, or muscle weakness at the site of the injection, or have myasthenia gravis, Lambert-Eaton syndrome, or ALS.  It is also possible that as with any injection, there may be an allergic reaction or no effect from the medication. Reduced effectiveness after repeated injections is sometimes seen and rarely infection at the injection site may occur. All care will be taken to prevent these side effects. If therapy is given over a long time, atrophy and wasting in the muscle injected may occur. Occasionally the patient's become refractory to treatment because they develop antibodies to the toxin. In this event, therapy needs to be modified.  I have read the above information and consent to the administration of botulism toxin.    BOTOX PROCEDURE NOTE FOR MIGRAINE HEADACHE    Contraindications and precautions discussed with patient(above). Aseptic procedure was observed and patient tolerated procedure. Procedure performed by Dr. Georgia Dom  The condition has existed for more than 6 months, and pt does not have a diagnosis of ALS, Myasthenia Gravis or Lambert-Eaton Syndrome.  Risks and benefits of injections discussed and pt agrees to proceed with the procedure.  Written consent obtained  These injections are medically necessary. Pt  receives good benefits from these injections. These injections do not cause sedations or hallucinations which the oral therapies may cause.  Description of procedure:  The patient was placed in a sitting position. The  standard protocol was used for Botox as follows, with 5 units of Botox injected at each site:   -Procerus muscle, midline injection  -Corrugator muscle, bilateral injection  -Frontalis muscle, bilateral injection, with  2 sites each side, medial injection was performed in the upper one third of the frontalis muscle, in the region vertical from the medial inferior edge of the superior orbital rim. The lateral injection was again in the upper one third of the forehead vertically above the lateral limbus of the cornea, 1.5 cm lateral to the medial injection site.  -Temporalis muscle injection, 4 sites, bilaterally. The first injection was 3 cm above the tragus of the ear, second injection site was 1.5 cm to 3 cm up from the first injection site in line with the tragus of the ear. The third injection site was 1.5-3 cm forward between the first 2 injection sites. The fourth injection site was 1.5 cm posterior to the second injection site.   -Occipitalis muscle injection, 3 sites, bilaterally. The first injection was done one half way between the occipital protuberance and the tip of the mastoid process behind the ear. The second injection site was done lateral and superior to the first, 1 fingerbreadth from the first injection. The third injection site was 1 fingerbreadth superiorly and medially from the first injection site.  -Cervical paraspinal muscle injection, 2 sites, bilateral knee first injection site was 1 cm from the midline of the cervical spine, 3 cm inferior to the lower border of the occipital protuberance. The second injection site was 1.5 cm superiorly and laterally to the first injection site.  -Trapezius muscle injection was performed at 3 sites, bilaterally. The first injection site was in the upper trapezius muscle halfway between the inflection point of the neck, and the acromion. The second injection site was one half way between the acromion and the first injection site. The third injection was done between the first injection site and the inflection point of the neck.   Will return for repeat injection in 3 months.   200 units of Botox was used, any Botox not injected was wasted. The patient  tolerated the procedure well, there were no complications of the above procedure.

## 2020-05-26 NOTE — Progress Notes (Signed)
Botox- 200 units x 1 vial Lot: [id number] Expiration: 01/2023 NDC: 7471-5953-96  Bacteriostatic 0.9% Sodium Chloride- 83mL total Lot: [id number] Expiration: 06/22/2021 NDC: 1504-1364-38  Dx: P77.939 B/B

## 2020-06-09 ENCOUNTER — Telehealth: Payer: Self-pay | Admitting: *Deleted

## 2020-06-09 NOTE — Telephone Encounter (Signed)
Pt called stating that she didn't have a refill of her adderall xr 15 mg this was sent on 04/06/2020  [facility] [phone] and spoke with Quillian Quince he transferred me to the pharmacist  Yolanda Manges who checked on this she informed me that this was on back order by the manufacturer.  Asked if this can be obtained with another mfg? And was advised that there wasn't another mfg that had this dosage.  The suggestion was to either get a PA for the branded adderall or she can take 2 of the 10 mg or 1 of the 20 mg.    Called pt and made her aware of this and she is ok with taking 2 of the 10 mg  Will fwd to pcp for review.

## 2020-06-10 MED ORDER — AMPHETAMINE-DEXTROAMPHET ER 10 MG PO CP24: 20.0000 mg | ORAL_CAPSULE | Freq: Every day | ORAL | 0 refills | Status: DC

## 2020-06-10 NOTE — Telephone Encounter (Signed)
Meds ordered this encounter  Medications  . amphetamine-dextroamphetamine (ADDERALL XR) 10 MG 24 hr capsule    Sig: Take 2 capsules (20 mg total) by mouth daily.    Dispense:  60 capsule    Refill:  0    Due to shortage

## 2020-06-13 ENCOUNTER — Encounter: Payer: Self-pay | Admitting: *Deleted

## 2020-08-08 ENCOUNTER — Other Ambulatory Visit: Payer: Self-pay | Admitting: *Deleted

## 2020-08-08 DIAGNOSIS — F902 Attention-deficit hyperactivity disorder, combined type: Secondary | ICD-10-CM

## 2020-08-08 NOTE — Telephone Encounter (Signed)
Lvm asking if she wanted to stay on the 20 mg total (10 mg ea tablet) if this is ok to rtn call or send my chart to let us know.

## 2020-08-11 MED ORDER — AMPHETAMINE-DEXTROAMPHET ER 10 MG PO CP24: 20.0000 mg | ORAL_CAPSULE | Freq: Every day | ORAL | 0 refills | Status: DC

## 2020-08-17 ENCOUNTER — Other Ambulatory Visit: Payer: Self-pay | Admitting: Family Medicine

## 2020-08-26 ENCOUNTER — Telehealth: Payer: Self-pay

## 2020-08-26 NOTE — Telephone Encounter (Signed)
Pt called to inquire about PA for Adderall.  She states that she desperately needs to get this filled and she is waiting on this PA.  Please advise pt of status of PA.  Charyl Bigger, CMA

## 2020-08-30 ENCOUNTER — Ambulatory Visit: Payer: BC Managed Care – PPO | Admitting: Neurology

## 2020-08-30 ENCOUNTER — Encounter: Payer: Self-pay | Admitting: Family Medicine

## 2020-08-30 ENCOUNTER — Other Ambulatory Visit: Payer: Self-pay | Admitting: Medical-Surgical

## 2020-08-30 DIAGNOSIS — F902 Attention-deficit hyperactivity disorder, combined type: Secondary | ICD-10-CM

## 2020-08-30 DIAGNOSIS — G43711 Chronic migraine without aura, intractable, with status migrainosus: Secondary | ICD-10-CM | POA: Diagnosis not present

## 2020-08-30 MED ORDER — AMPHETAMINE-DEXTROAMPHET ER 10 MG PO CP24: 10.0000 mg | ORAL_CAPSULE | Freq: Every day | ORAL | 0 refills | Status: DC

## 2020-08-30 NOTE — Progress Notes (Signed)
Botox- 200 units x 1 vial Lot: [id number] Expiration: 01/2023 NDC: [id number]  Bacteriostatic 0.9% Sodium Chloride- 62m total Lot: [id number]: 01/22/2022 NDC: [id number] Dx: [id number]

## 2020-08-30 NOTE — Progress Notes (Signed)
Consent Form Botulism Toxin Injection For Chronic Migraine   Interval history 08/30/2020: Stable. +a. She has  new job at a better schol and both her grandkids are there and she is thrilled. +5 orb oculi, +5 masseters, +5 nasalis really helped as well. Doing fantastic. May be late to next appointment.  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 Stressful times, she is having anxiety attacks, limited ativan, can refill if needed. Discussed side effects. +a  Reviewed orally with patient, additionally signature is on file:  Consent Form Botulism Toxin Injection For Chronic Migraine    Reviewed orally with patient, additionally signature is on file:  Botulism toxin has been approved by the Federal drug administration for treatment of chronic migraine. Botulism toxin does not cure chronic migraine and it may not be effective in some patients.  The administration of botulism toxin is accomplished by injecting a small amount of toxin into the muscles of the neck and head. Dosage must be titrated for each individual. Any benefits resulting from botulism toxin tend to wear off after 3 months with a repeat injection required if benefit is to be maintained. Injections are usually done every 3-4 months with maximum effect peak achieved by about 2 or 3 weeks. Botulism toxin is expensive and you should be sure of what costs you will incur resulting from the injection.  The side effects of botulism toxin use for chronic migraine may include:   -Transient, and usually mild, facial  weakness with facial injections  -Transient, and usually mild, head or neck weakness with head/neck injections  -Reduction or loss of forehead facial animation due to forehead muscle weakness  -Eyelid drooping  -Dry eye  -Pain at the site of injection or bruising at the site of injection  -Double vision  -Potential unknown long term risks  Contraindications: You should not have Botox if you are pregnant, nursing, allergic to albumin, have an infection, skin condition, or muscle weakness at the site of the injection, or have myasthenia gravis, Lambert-Eaton syndrome, or ALS.  It is also possible that as with any injection, there may be an allergic reaction or no effect from the medication. Reduced effectiveness after repeated injections is sometimes seen and rarely infection at the injection site may occur. All care will be taken to prevent these side effects. If therapy is given over a long time, atrophy and wasting in the muscle injected may occur. Occasionally the patient's become refractory to treatment because they develop antibodies to the toxin. In this event, therapy needs to be modified.  I have read the above information and consent to the administration of botulism toxin.    BOTOX PROCEDURE NOTE FOR MIGRAINE HEADACHE    Contraindications and precautions discussed with patient(above). Aseptic procedure was observed and patient tolerated procedure. Procedure performed by Dr. Georgia Dom  The condition has existed for more than 6 months, and pt does not have a diagnosis of ALS, Myasthenia Gravis or Lambert-Eaton Syndrome.  Risks and benefits of injections discussed and pt agrees to proceed with the procedure.  Written  consent obtained  These injections are medically necessary. Pt  receives good benefits from these injections. These injections do not cause sedations or hallucinations which the oral therapies may cause.  Description of procedure:  The patient was placed in a sitting  position. The standard protocol was used for Botox as follows, with 5 units of Botox injected at each site:   -Procerus muscle, midline injection  -Corrugator muscle, bilateral injection  -Frontalis muscle, bilateral injection, with 2 sites each side, medial injection was performed in the upper one third of the frontalis muscle, in the region vertical from the medial inferior edge of the superior orbital rim. The lateral injection was again in the upper one third of the forehead vertically above the lateral limbus of the cornea, 1.5 cm lateral to the medial injection site.  -Temporalis muscle injection, 4 sites, bilaterally. The first injection was 3 cm above the tragus of the ear, second injection site was 1.5 cm to 3 cm up from the first injection site in line with the tragus of the ear. The third injection site was 1.5-3 cm forward between the first 2 injection sites. The fourth injection site was 1.5 cm posterior to the second injection site.   -Occipitalis muscle injection, 3 sites, bilaterally. The first injection was done one half way between the occipital protuberance and the tip of the mastoid process behind the ear. The second injection site was done lateral and superior to the first, 1 fingerbreadth from the first injection. The third injection site was 1 fingerbreadth superiorly and medially from the first injection site.  -Cervical paraspinal muscle injection, 2 sites, bilateral knee first injection site was 1 cm from the midline of the cervical spine, 3 cm inferior to the lower border of the occipital protuberance. The second injection site was 1.5 cm superiorly and laterally to the first injection site.  -Trapezius muscle injection was performed at 3 sites, bilaterally. The first injection site was in the upper trapezius muscle halfway between the inflection point of the neck, and the acromion. The second injection site was one half way between the acromion and the first injection site.  The third injection was done between the first injection site and the inflection point of the neck.   Will return for repeat injection in 3 months.   200 units of Botox was used, any Botox not injected was wasted. The patient tolerated the procedure well, there were no complications of the above procedure.

## 2020-08-30 NOTE — Telephone Encounter (Signed)
Medication: Adderall XR 10 mg Prior authorization submitted via CoverMyMeds PA submission pending

## 2020-08-31 NOTE — Telephone Encounter (Addendum)
Medication: Adderall XR 10 mg Prior authorization determination received Medication has been approved on 08/30/2020 Approved from 08/30/2020-08/31/2023

## 2020-09-01 ENCOUNTER — Other Ambulatory Visit: Payer: Self-pay | Admitting: Medical-Surgical

## 2020-09-01 DIAGNOSIS — F902 Attention-deficit hyperactivity disorder, combined type: Secondary | ICD-10-CM

## 2020-09-01 MED ORDER — AMPHETAMINE-DEXTROAMPHET ER 10 MG PO CP24: 20.0000 mg | ORAL_CAPSULE | Freq: Every day | ORAL | 0 refills | Status: DC

## 2020-09-01 NOTE — Telephone Encounter (Signed)
Samuel Bouche, NP  You 1 hour ago (12:31 PM)   Awesome. I had re-sent the prescription for '10mg'$  daily for 30 days since she was desperate for the medication. Can you verify if she picked this up or not? If not, I can resend the original '20mg'$  daily dose script for her.

## 2020-09-01 NOTE — Telephone Encounter (Signed)
LVM for pt TRC.  Spoke with the pharmacy and she has not picked up the Rx yet.

## 2020-09-01 NOTE — Progress Notes (Signed)
New prescription for '10mg'$  capsules with a daily dose of '20mg'$  sent to the pharmacy. Prior prescription cancelled. Voicemail left for patient regarding the change.   Clearnce Sorrel, DNP, APRN, FNP-BC [facility]

## 2020-09-01 NOTE — Telephone Encounter (Signed)
It was submited for the 10 mg caps, quantity of 60 for 30 day supply.

## 2020-09-02 NOTE — Telephone Encounter (Signed)
Spoke to pt and she has not picked up the Adderall XR 10 mg, 2 qd yet. I asked her if she wanted a new Rx sent in for Adderall XR 20 mg qd. She said that either way was fine with her, but asked if a new PA would need to be done because of the dosage difference. I told her I was not sure about that, but if a PA needed to be done, I could go ahead and start the process. She noted that she is no longer working for [employer] and that her insurance will be changing the first week of September. She said that she will send all of the info when she has it, but she will also be changing pharmacies. I told her that with that being the case, that an option would be to go ahead and pick up what was at the pharmacy now, and once she had the new insurance it could be sent in at the 20 mg dose to the new pharmacy and then if a PA needed to be done with her new insurance co we could start the process then. Pt agreeable to taking current Rx at the pharmacy and will reach out once she has the new insurance info.

## 2020-09-02 NOTE — Telephone Encounter (Signed)
Spoke with Joy who said that she went ahead and resent the Rx for the 20 mg qd dose to her pharmacy last night. I called pt back to make sure she knew that the 20 mg dose is what she will be picking up at the pharmacy. Pt aware. No further questions or concerns at this time.

## 2020-09-22 ENCOUNTER — Encounter: Payer: Self-pay | Admitting: Family Medicine

## 2020-09-22 MED ORDER — FLUOXETINE HCL 20 MG PO TABS: 40.0000 mg | ORAL_TABLET | Freq: Every day | ORAL | 0 refills | Status: DC

## 2020-10-03 ENCOUNTER — Other Ambulatory Visit: Payer: Self-pay | Admitting: Neurology

## 2020-10-11 ENCOUNTER — Other Ambulatory Visit: Payer: Self-pay | Admitting: Neurology

## 2020-10-11 DIAGNOSIS — G43711 Chronic migraine without aura, intractable, with status migrainosus: Secondary | ICD-10-CM

## 2020-10-11 MED ORDER — METHYLPREDNISOLONE 4 MG PO TBPK: ORAL_TABLET | ORAL | 1 refills | Status: DC

## 2020-10-11 MED ORDER — ELETRIPTAN HYDROBROMIDE 40 MG PO TABS: 40.0000 mg | ORAL_TABLET | ORAL | 11 refills | Status: DC | PRN

## 2020-10-28 ENCOUNTER — Telehealth (INDEPENDENT_AMBULATORY_CARE_PROVIDER_SITE_OTHER): Payer: 59 | Admitting: Family Medicine

## 2020-10-28 ENCOUNTER — Encounter: Payer: Self-pay | Admitting: Family Medicine

## 2020-10-28 DIAGNOSIS — F419 Anxiety disorder, unspecified: Secondary | ICD-10-CM

## 2020-10-28 DIAGNOSIS — G43009 Migraine without aura, not intractable, without status migrainosus: Secondary | ICD-10-CM

## 2020-10-28 DIAGNOSIS — F902 Attention-deficit hyperactivity disorder, combined type: Secondary | ICD-10-CM

## 2020-10-28 DIAGNOSIS — G43711 Chronic migraine without aura, intractable, with status migrainosus: Secondary | ICD-10-CM | POA: Diagnosis not present

## 2020-10-28 MED ORDER — AMPHETAMINE-DEXTROAMPHET ER 10 MG PO CP24: 20.0000 mg | ORAL_CAPSULE | Freq: Every day | ORAL | 0 refills | Status: DC

## 2020-10-28 MED ORDER — FLUOXETINE HCL 20 MG PO TABS: 40.0000 mg | ORAL_TABLET | Freq: Every day | ORAL | 0 refills | Status: DC

## 2020-10-28 MED ORDER — AMPHETAMINE-DEXTROAMPHET ER 10 MG PO CP24
20.0000 mg | ORAL_CAPSULE | Freq: Every day | ORAL | 0 refills | Status: DC
Start: 2020-11-27 — End: 2021-02-09

## 2020-10-28 NOTE — Progress Notes (Signed)
Virtual Visit via Video Note  I connected with Lacey Jensen on 10/28/20 at  4:20 PM EDT by a video enabled telemedicine application and verified that I am speaking with the correct person using two identifiers.   I discussed the limitations of evaluation and management by telemedicine and the availability of in person appointments. The patient expressed understanding and agreed to proceed.  Patient location: at home Provider location: in office  Subjective:    CC:   HPI: Follow-up:  ADHD - Reports symptoms are well controlled on current regime. Denies any problems with insomnia, chest pain, palpitations, or SOB.  She has been doing well with the 20 mg dose she was probably previously on 15.  She has not had any symptoms she says on the weekends she just takes 10 mg.  Follow-up migraine headaches-overall she is doing well she has not been getting them frequently.  They did have a bad one recently and recently started Relpax she is only had to use it once but so far it worked well.  Follow-up anxiety-doing well with her fluoxetine 40 mg daily happy with her current regimen.   Past medical history, Surgical history, Family history not pertinant except as noted below, Social history, Allergies, and medications have been entered into the medical record, reviewed, and corrections made.    Objective:    General: Speaking clearly in complete sentences without any shortness of breath.  Alert and oriented x3.  Normal judgment. No apparent acute distress.    Impression and Recommendations:    Attention deficit hyperactivity disorder (ADHD) Well controlled. Continue current regimen. 90 days supply sent to pharmacy. Follow up in  6 months.   Chronic migraine without aura, with intractable migraine, so stated, with status migrainosus She is actually doing well recently started a new triptan.  She is only taken it once but it worked well.  Anxiety Happy with her current regimen of  fluoxetine.  We will make sure that she has refills plan to follow-up in 6 months.    No orders of the defined types were placed in this encounter.   Meds ordered this encounter  Medications   amphetamine-dextroamphetamine (ADDERALL XR) 10 MG 24 hr capsule    Sig: Take 2 capsules (20 mg total) by mouth daily.    Dispense:  60 capsule    Refill:  0   amphetamine-dextroamphetamine (ADDERALL XR) 10 MG 24 hr capsule    Sig: Take 2 capsules (20 mg total) by mouth daily.    Dispense:  60 capsule    Refill:  0   amphetamine-dextroamphetamine (ADDERALL XR) 10 MG 24 hr capsule    Sig: Take 2 capsules (20 mg total) by mouth daily.    Dispense:  60 capsule    Refill:  0   FLUoxetine (PROZAC) 20 MG tablet    Sig: Take 2 tablets (40 mg total) by mouth daily.    Dispense:  180 tablet    Refill:  0    Did encourage her to schedule her mammogram.  She is due for Pap smear in the spring with Dr. Gala Romney.  I discussed the assessment and treatment plan with the patient. The patient was provided an opportunity to ask questions and all were answered. The patient agreed with the plan and demonstrated an understanding of the instructions.   The patient was advised to call back or seek an in-person evaluation if the symptoms worsen or if the condition fails to improve as anticipated.   Barnetta Chapel  Madilyn Fireman, MD

## 2020-10-28 NOTE — Assessment & Plan Note (Signed)
Well controlled. Continue current regimen. 90 days supply sent to pharmacy. Follow up in  6 months.

## 2020-10-28 NOTE — Assessment & Plan Note (Signed)
Happy with her current regimen of fluoxetine.  We will make sure that she has refills plan to follow-up in 6 months.

## 2020-10-28 NOTE — Assessment & Plan Note (Signed)
She is actually doing well recently started a new triptan.  She is only taken it once but it worked well.

## 2020-11-17 ENCOUNTER — Telehealth: Payer: Self-pay | Admitting: *Deleted

## 2020-11-17 MED ORDER — LISDEXAMFETAMINE DIMESYLATE 30 MG PO CAPS: 30.0000 mg | ORAL_CAPSULE | ORAL | 0 refills | Status: DC

## 2020-11-17 NOTE — Telephone Encounter (Signed)
Called [facility] to see why her medication hasn't been released for p/u.   Logan informed me that this will require a PA however they don't have this medication in stock. They don't have any Adderall 10,15,20,or 30 mg.   Called pt to inform her of this and that Dr. Madilyn Fireman will need to change her to Vyvanse this is the closest to what she is currently on.   Pt is agreeable to this she stated that she has taken this prior.

## 2020-11-17 NOTE — Telephone Encounter (Signed)
Pt advised of medication being sent and that it may possibly need a PA

## 2020-11-17 NOTE — Telephone Encounter (Signed)
Meds ordered this encounter  Medications   lisdexamfetamine (VYVANSE) 30 MG capsule    Sig: Take 1 capsule (30 mg total) by mouth every morning.    Dispense:  30 capsule    Refill:  0

## 2020-11-23 ENCOUNTER — Telehealth: Payer: Self-pay | Admitting: *Deleted

## 2020-11-29 ENCOUNTER — Encounter: Payer: Self-pay | Admitting: Family Medicine

## 2020-12-01 ENCOUNTER — Ambulatory Visit: Payer: BC Managed Care – PPO | Admitting: Adult Health

## 2020-12-02 NOTE — Telephone Encounter (Signed)
Error

## 2020-12-05 ENCOUNTER — Encounter: Payer: Self-pay | Admitting: Neurology

## 2020-12-05 ENCOUNTER — Ambulatory Visit: Payer: 59 | Admitting: Neurology

## 2020-12-05 VITALS — Wt 160.4 lb

## 2020-12-05 DIAGNOSIS — G43711 Chronic migraine without aura, intractable, with status migrainosus: Secondary | ICD-10-CM

## 2020-12-05 MED ORDER — LORAZEPAM 0.5 MG PO TABS: 0.5000 mg | ORAL_TABLET | Freq: Three times a day (TID) | ORAL | 4 refills | Status: DC

## 2020-12-05 NOTE — Progress Notes (Addendum)
Consent Form Botulism Toxin Injection For Chronic Migraine   Interval history 12/08/2020: Stable, > 80% improvement migraine and headache freq and severity. +a. 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   Reviewed orally with patient, additionally signature is on file:  Consent Form Botulism Toxin Injection For Chronic Migraine    Reviewed orally with patient, additionally signature is on file:  Botulism toxin has been approved by the Federal drug administration for treatment of chronic migraine. Botulism toxin does not cure chronic migraine and it may not be effective in some patients.  The administration of botulism toxin is accomplished by injecting a small amount of toxin into the muscles of the neck and head. Dosage must be titrated for each individual. Any benefits resulting from botulism toxin tend to wear off after 3 months with a repeat injection required if benefit is to be maintained. Injections are usually done every 3-4 months with maximum effect peak achieved by about 2 or 3 weeks. Botulism toxin is expensive and you should be sure of what costs you will incur resulting from the injection.  The side effects of botulism toxin use for chronic migraine may include:   -Transient, and usually mild, facial weakness with facial injections  -Transient, and usually mild, head or neck weakness with head/neck injections  -Reduction or loss of forehead facial animation due to forehead muscle weakness  -Eyelid drooping  -Dry eye  -Pain at the site of injection or bruising at the site of injection  -Double vision  -Potential unknown long term risks  Contraindications: You should not have Botox if you are pregnant, nursing, allergic to albumin, have an infection, skin condition, or muscle weakness at the site of the injection, or have myasthenia gravis, Lambert-Eaton syndrome, or ALS.  It is also possible that as with any injection,  there may be an allergic reaction or no effect from the medication. Reduced effectiveness after repeated injections is sometimes seen and rarely infection at the injection site may occur. All care will be taken to prevent these side effects. If therapy is given over a long time, atrophy and wasting in the muscle injected may occur. Occasionally the patient's become refractory to treatment because they develop antibodies to the toxin. In this event, therapy needs to be modified.  I have read the above information and consent to the administration of botulism toxin.    BOTOX PROCEDURE NOTE FOR MIGRAINE HEADACHE    Contraindications and precautions discussed with patient(above). Aseptic procedure was observed and patient tolerated procedure. Procedure performed by Dr. Georgia Dom  The condition has existed for more than 6 months, and pt does not have a diagnosis of ALS, Myasthenia Gravis or Lambert-Eaton Syndrome.  Risks and benefits of injections discussed and pt agrees to proceed with the procedure.  Written consent obtained  These injections are medically necessary. Pt  receives good benefits from these injections. These injections do not cause sedations or hallucinations which the oral therapies may cause.  Description of procedure:  The patient was placed in a sitting position. The standard protocol was used for Botox as follows, with 5 units of Botox injected at each site:   -Procerus muscle, midline injection  -Corrugator muscle, bilateral injection  -Frontalis muscle, bilateral injection, with 2 sites each side, medial injection was performed in the upper one third of the frontalis muscle, in the region vertical from the medial inferior edge of the superior orbital rim.  The lateral injection was again in the upper one third of the forehead vertically above the lateral limbus of the cornea, 1.5 cm lateral to the medial injection site.  -Temporalis muscle injection, 4 sites, bilaterally.  The first injection was 3 cm above the tragus of the ear, second injection site was 1.5 cm to 3 cm up from the first injection site in line with the tragus of the ear. The third injection site was 1.5-3 cm forward between the first 2 injection sites. The fourth injection site was 1.5 cm posterior to the second injection site.   -Occipitalis muscle injection, 3 sites, bilaterally. The first injection was done one half way between the occipital protuberance and the tip of the mastoid process behind the ear. The second injection site was done lateral and superior to the first, 1 fingerbreadth from the first injection. The third injection site was 1 fingerbreadth superiorly and medially from the first injection site.  -Cervical paraspinal muscle injection, 2 sites, bilateral knee first injection site was 1 cm from the midline of the cervical spine, 3 cm inferior to the lower border of the occipital protuberance. The second injection site was 1.5 cm superiorly and laterally to the first injection site.  -Trapezius muscle injection was performed at 3 sites, bilaterally. The first injection site was in the upper trapezius muscle halfway between the inflection point of the neck, and the acromion. The second injection site was one half way between the acromion and the first injection site. The third injection was done between the first injection site and the inflection point of the neck.   Will return for repeat injection in 3 months.   155 units of Botox was used, 45U Botox not injected was wasted. The patient tolerated the procedure well, there were no complications of the above procedure.

## 2020-12-05 NOTE — Progress Notes (Signed)
Botox- 200 units x 1 vial Lot: [id number] Expiration: 04/2023 NDC: 0086-7619-50  Bacteriostatic 0.9% Sodium Chloride- 4mL total Lot: [id number] Expiration: 01/22/2022 NDC: 2458-0998-33  Dx:  A25.053  B/B

## 2020-12-06 ENCOUNTER — Ambulatory Visit: Payer: BC Managed Care – PPO | Admitting: Neurology

## 2020-12-07 ENCOUNTER — Telehealth: Payer: Self-pay

## 2020-12-07 NOTE — Telephone Encounter (Signed)
Medication: lisdexamfetamine (VYVANSE) 30 MG capsule Prior authorization submitted via CoverMyMeds on 12/07/2020 PA submission pending

## 2020-12-09 NOTE — Telephone Encounter (Signed)
Medication: lisdexamfetamine (VYVANSE) 30 MG capsule Prior authorization determination received Medication has been denied Reason for denial:  "This medicine is covered only if you have failed or cannot use all of the following: (1) Brand Concerta*. (2) Methylphenidate extended-release capsule* (generic Metadate CD or Ritalin LA)."

## 2020-12-09 NOTE — Telephone Encounter (Signed)
LVM for pt to call to discuss.  T. Daquane Aguilar, CMA  

## 2020-12-09 NOTE — Telephone Encounter (Signed)
Spoke w/pt she is ok with whatever Dr. Madilyn Fireman advises

## 2020-12-09 NOTE — Telephone Encounter (Signed)
Please call and let patient know that the Vyvanse was denied and that they will only play for Concerta, Metadate CD or Ritalin LA

## 2020-12-12 MED ORDER — METHYLPHENIDATE HCL ER (OSM) 27 MG PO TBCR: 27.0000 mg | EXTENDED_RELEASE_TABLET | ORAL | 0 refills | Status: DC

## 2020-12-12 NOTE — Addendum Note (Signed)
Addended by: Beatrice Lecher D on: 12/12/2020 12:42 PM   Modules accepted: Orders

## 2020-12-12 NOTE — Telephone Encounter (Signed)
OK, 30 days supply sent of Concerta to try. We can adjust dose if needed.

## 2020-12-12 NOTE — Telephone Encounter (Signed)
Left message advising of recommendations.  

## 2020-12-20 ENCOUNTER — Other Ambulatory Visit: Payer: Self-pay | Admitting: *Deleted

## 2020-12-20 DIAGNOSIS — F419 Anxiety disorder, unspecified: Secondary | ICD-10-CM

## 2020-12-20 MED ORDER — FLUOXETINE HCL 20 MG PO TABS: 40.0000 mg | ORAL_TABLET | Freq: Every day | ORAL | 1 refills | Status: DC

## 2021-01-11 ENCOUNTER — Other Ambulatory Visit: Payer: Self-pay

## 2021-01-11 ENCOUNTER — Telehealth: Payer: Self-pay | Admitting: Neurology

## 2021-01-11 MED ORDER — GABAPENTIN 100 MG PO CAPS: ORAL_CAPSULE | ORAL | 0 refills | Status: DC

## 2021-01-11 NOTE — Telephone Encounter (Signed)
Patient's next Botox injection is 03/07/21. She now has [employer]. She no longer has BC[employer].

## 2021-01-19 MED ORDER — BOTOX 100 UNITS IJ SOLR: INTRAMUSCULAR | 1 refills | Status: DC

## 2021-01-19 NOTE — Telephone Encounter (Signed)
Obtained PA from [facility]. PA #[id number] (01/19/21- 01/19/22).

## 2021-01-19 NOTE — Addendum Note (Signed)
Addended by: Gildardo Griffes on: 01/19/2021 12:19 PM   Modules accepted: Orders

## 2021-01-26 NOTE — Telephone Encounter (Signed)
I called patient to follow up on the MyChart message I sent a few weeks ago. I left a message with information regarding [place] explained we are required to use them to obtain Botox when patients have [facility] insurance. Provided [facility]'s direct number and details on how to provide the pharmacy with her Botox Savings copay card. Requested CB if she has questions.

## 2021-02-06 ENCOUNTER — Encounter: Payer: Self-pay | Admitting: Neurology

## 2021-02-06 NOTE — Telephone Encounter (Signed)
Spoke with [facility] today to discuss Botox prescription. Patient was connected to our call to provide consent/payment for Botox. Patient provided Botox Savings Card to the representative but was told she still owed a little over $200. Patient was not prepared to pay this out of pocket, so she is unsure if she can continue with Botox treatment. She states she will think on it and call back.

## 2021-02-09 ENCOUNTER — Other Ambulatory Visit: Payer: Self-pay

## 2021-02-09 DIAGNOSIS — F902 Attention-deficit hyperactivity disorder, combined type: Secondary | ICD-10-CM

## 2021-02-09 MED ORDER — METHYLPHENIDATE HCL ER (OSM) 27 MG PO TBCR: 27.0000 mg | EXTENDED_RELEASE_TABLET | ORAL | 0 refills | Status: DC

## 2021-02-09 NOTE — Telephone Encounter (Signed)
Patient called requesting a refill on Concerta. Patient states Concerta is working well for her. Forward to Dr. Madilyn Fireman.

## 2021-03-06 ENCOUNTER — Emergency Department
Admission: EM | Admit: 2021-03-06 | Discharge: 2021-03-06 | Disposition: A | Discharge status: Home / Self Care | Payer: 59 | Source: Home / Self Care | Attending: Family Medicine | Admitting: Family Medicine

## 2021-03-06 ENCOUNTER — Other Ambulatory Visit: Payer: Self-pay

## 2021-03-06 DIAGNOSIS — J020 Streptococcal pharyngitis: Secondary | ICD-10-CM | POA: Diagnosis not present

## 2021-03-06 LAB — POCT RAPID STREP A (OFFICE): Rapid Strep A Screen: POSITIVE — AB

## 2021-03-06 MED ORDER — PENICILLIN V POTASSIUM 500 MG PO TABS: 500.0000 mg | ORAL_TABLET | Freq: Two times a day (BID) | ORAL | 0 refills | Status: AC

## 2021-03-06 MED ORDER — ONDANSETRON 4 MG PO TBDP
4.0000 mg | ORAL_TABLET | Freq: Once | ORAL | Status: AC
  Administered 2021-03-06: 4 mg via ORAL

## 2021-03-06 NOTE — ED Triage Notes (Signed)
Pt presents with congestion for several weeks and sore throat that began this morning. Pt has been taking Mucinex D with little improvement. Pt states she is also experiencing nausea

## 2021-03-06 NOTE — ED Provider Notes (Signed)
[facility]    CSN: [id number] Arrival date & time: 03/06/21  0820      History   Chief Complaint Chief Complaint  Patient presents with   Nasal Congestion   Sore Throat   Headache    HPI Lacey Jensen is a 56 y.o. female.   HPI  Patient works in the school system.  She has had a cough and cold for the last 10 days to 2 weeks.  She woke up in the middle of the night with a very painful sore throat.  Could hardly swallow.  Headaches and body aches.  Past Medical History:  Diagnosis Date   Abnormal Pap smear    Age 34 and age 75   Allergy    seasonal   Dysplasia of cervix    leep   Headache(784.0)    Immunotherapy    Osteoporosis     Patient Active Problem List   Diagnosis Date Noted   Hemorrhoids 07/21/2019   Bloating 10/13/2018   Chronic constipation 10/13/2018   Chronic migraine without aura, with intractable migraine, so stated, with status migrainosus 06/04/2018   Cervical myofascial pain syndrome 06/04/2018   Anxiety 06/05/2017   Insomnia 12/02/2013   History of abnormal Pap smear 06/18/2012   ALLERGIC RHINITIS 04/25/2009   Attention deficit hyperactivity disorder (ADHD) 06/25/2008    Past Surgical History:  Procedure Laterality Date   A GYN surgery  2002   btl      OB History     Gravida  2   Para  2   Term      Preterm      AB      Living  2      SAB      IAB      Ectopic      Multiple      Live Births               Home Medications    Prior to Admission medications   Medication Sig Start Date End Date Taking? Authorizing Provider  penicillin v potassium (VEETID) 500 MG tablet Take 1 tablet (500 mg total) by mouth 2 (two) times daily for 10 days. 03/06/21 03/16/21 Yes Raylene Everts, MD  azelastine (ASTELIN) 0.1 % nasal spray as needed.  02/06/18   [provider]  botulinum toxin Type A (BOTOX) 100 units SOLR injection Inject 155 units into the muscles of the head and neck every 3 months. To  be injected by the provider. Discard remainder. 01/19/21   Melvenia Beam, MD  CALCIUM-VITAMIN D PO Take 1 tablet by mouth daily.    [provider]  cyclobenzaprine (FLEXERIL) 10 MG tablet Take 1 tablet (10 mg total) by mouth at bedtime. 08/05/19   Melvenia Beam, MD  FIBER PO Take by mouth.    [provider]  FLUoxetine (PROZAC) 20 MG tablet Take 2 tablets (40 mg total) by mouth daily. 12/20/20   Hali Marry, MD  gabapentin (NEURONTIN) 100 MG capsule TAKE 1 TO 3 CAPSULES(100 TO 300 MG) BY MOUTH AT BEDTIME 01/11/21   Hali Marry, MD  MAGNESIUM PO Take by mouth.     [provider]  valACYclovir (VALTREX) 500 MG tablet Take 1 tablet (500 mg total) by mouth 2 (two) times daily. 04/06/20   Hali Marry, MD    Family History Family History  Problem Relation Age of Onset   Migraines Son    Colon cancer  Neg Hx    Colon polyps Neg Hx    Esophageal cancer Neg Hx    Rectal cancer Neg Hx    Stomach cancer Neg Hx     Social History Social History   Tobacco Use   Smoking status: Former    Types: Cigarettes    Quit date: 01/23/2004    Years since quitting: 17.1   Smokeless tobacco: Never  Vaping Use   Vaping Use: Never used  Substance Use Topics   Alcohol use: Yes    Alcohol/week: 1.0 standard drink    Types: 1 Standard drinks or equivalent per week   Drug use: No     Allergies   Ambien [zolpidem], Belsomra [suvorexant], Sumatriptan succinate, and Zolmitriptan   Review of Systems Review of Systems  See HPI Physical Exam Triage Vital Signs ED Triage Vitals  Enc Vitals Group     BP 03/06/21 0834 117/79     Pulse Rate 03/06/21 0834 90     Resp 03/06/21 0834 14     Temp 03/06/21 0834 98.7 F (37.1 C)     Temp Source 03/06/21 0834 Oral     SpO2 03/06/21 0834 97 %     Weight 03/06/21 0836 165 lb (74.8 kg)     Height --      Head Circumference --      Peak Flow --      Pain Score 03/06/21 0836 9     Pain Loc --       Pain Edu? --      Excl. in [place]? --    No data found.  Updated Vital Signs BP 117/79 (BP Location: Right Arm)    Pulse 90    Temp 98.7 F (37.1 C) (Oral)    Resp 14    Wt 74.8 kg    LMP 11/23/2015    SpO2 97%    BMI 24.37 kg/m      Physical Exam Constitutional:      General: She is not in acute distress.    Appearance: She is well-developed.  HENT:     Head: Normocephalic and atraumatic.     Right Ear: Tympanic membrane and ear canal normal.     Left Ear: Tympanic membrane and ear canal normal.     Nose: No congestion.     Mouth/Throat:     Mouth: Mucous membranes are moist.     Pharynx: Uvula midline. Pharyngeal swelling and posterior oropharyngeal erythema present.     Tonsils: Tonsillar exudate present. 2+ on the right. 2+ on the left.  Eyes:     Conjunctiva/sclera: Conjunctivae normal.     Pupils: Pupils are equal, round, and reactive to light.  Cardiovascular:     Rate and Rhythm: Normal rate and regular rhythm.  Pulmonary:     Effort: Pulmonary effort is normal. No respiratory distress.     Breath sounds: Normal breath sounds.  Abdominal:     General: There is no distension.     Palpations: Abdomen is soft.  Musculoskeletal:        General: Normal range of motion.     Cervical back: Normal range of motion.  Lymphadenopathy:     Cervical: Cervical adenopathy present.  Skin:    General: Skin is warm and dry.  Neurological:     Mental Status: She is alert.     UC Treatments / Results  Labs (all labs ordered are listed, but only abnormal results are displayed) Labs Reviewed  POCT RAPID  STREP A (OFFICE) - Abnormal; Notable for the following components:      Result Value   Rapid Strep A Screen Positive (*)    All other components within normal limits    EKG   Radiology No results found.  Procedures Procedures (including critical care time)  Medications Ordered in UC Medications  ondansetron (ZOFRAN-ODT) disintegrating tablet 4 mg (4 mg Oral Given  03/06/21 0841)    Initial Impression / Assessment and Plan / UC Course  I have reviewed the triage vital signs and the nursing notes.  Pertinent labs & imaging results that were available during my care of the patient were reviewed by me and considered in my medical decision making (see chart for details).     Final Clinical Impressions(s) / UC Diagnoses   Final diagnoses:  Strep pharyngitis     Discharge Instructions      Take your penicillin 2 times a day for 10 full days.  Take 2 doses today May take Tylenol, Advil, only for the sore throat pain.  Sore throat lozenges and sprays also help   ED Prescriptions     Medication Sig Dispense Auth. Provider   penicillin v potassium (VEETID) 500 MG tablet Take 1 tablet (500 mg total) by mouth 2 (two) times daily for 10 days. 20 tablet Raylene Everts, MD      PDMP not reviewed this encounter.   Raylene Everts, MD 03/06/21 [phone]

## 2021-03-06 NOTE — Discharge Instructions (Addendum)
Take your penicillin 2 times a day for 10 full days.  Take 2 doses today May take Tylenol, Advil, only for the sore throat pain.  Sore throat lozenges and sprays also help

## 2021-03-07 ENCOUNTER — Ambulatory Visit: Payer: 59 | Admitting: Neurology

## 2021-03-07 NOTE — Telephone Encounter (Signed)
Pt will need to resched appt due to [facility] not having an updated consent from the patient, called pt 2x yesterday to let her know she needed to call them but not answer.

## 2021-03-14 ENCOUNTER — Encounter: Payer: Self-pay | Admitting: Neurology

## 2021-03-15 NOTE — Telephone Encounter (Signed)
Unfortunately she has [employer] and they don't do buy and bill, so she has to go through specialty.

## 2021-03-20 ENCOUNTER — Telehealth: Payer: Self-pay | Admitting: Neurology

## 2021-03-20 NOTE — Telephone Encounter (Signed)
I sent her a message offering tomorrow at 2 pm or if she can't make that, Wednesday 3/1 at 4 pm.

## 2021-03-20 NOTE — Telephone Encounter (Signed)
Already sent her a mychart message in the other existing encounter.

## 2021-03-20 NOTE — Telephone Encounter (Signed)
Can we squeeze her in this week or next? I am happy to use samples until we can figure out anything that is going on, let me know. 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 :)

## 2021-03-21 ENCOUNTER — Telehealth: Payer: Self-pay | Admitting: Neurology

## 2021-03-21 NOTE — Telephone Encounter (Signed)
Received 2 100 units vials of Botox.

## 2021-03-22 ENCOUNTER — Ambulatory Visit: Payer: 59 | Admitting: Neurology

## 2021-03-22 DIAGNOSIS — G43711 Chronic migraine without aura, intractable, with status migrainosus: Secondary | ICD-10-CM | POA: Diagnosis not present

## 2021-03-22 NOTE — Progress Notes (Signed)
Botox- 200 units x 1 vial ?Lot: [id number] ?Expiration: 05.2025 ?[place]: [phone] ? ?Bacteriostatic 0.9% Sodium Chloride- 76mL total ?Lot: [id number] ?Expiration: 08/23/2022 ?[place]: 7125-2479-98 ? ?Dx: S01.239 ?S/P  ? ?

## 2021-03-22 NOTE — Progress Notes (Signed)
? ?Consent Form ?Botulism Toxin Injection For Chronic Migraine ? ? ? ?03/22/2021: stable ?Interval history 12/08/2020: Stable, > 80% improvement migraine and headache freq and severity. +a. 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  ? ?Reviewed orally with patient, additionally signature is on file: ? ?Consent Form ?Botulism Toxin Injection For Chronic Migraine ? ? ? ?Reviewed orally with patient, additionally signature is on file: ? ?Botulism toxin has been approved by the Federal drug administration for treatment of chronic migraine. Botulism toxin does not cure chronic migraine and it may not be effective in some patients. ? ?The administration of botulism toxin is accomplished by injecting a small amount of toxin into the muscles of the neck and head. Dosage must be titrated for each individual. Any benefits resulting from botulism toxin tend to wear off after 3 months with a repeat injection required if benefit is to be maintained. Injections are usually done every 3-4 months with maximum effect peak achieved by about 2 or 3 weeks. Botulism toxin is expensive and you should be sure of what costs you will incur resulting from the injection. ? ?The side effects of botulism toxin use for chronic migraine may include: ? ? -Transient, and usually mild, facial weakness with facial injections ? -Transient, and usually mild, head or neck weakness with head/neck injections ? -Reduction or loss of forehead facial animation due to forehead muscle weakness ? -Eyelid drooping ? -Dry eye ? -Pain at the site of injection or bruising at the site of injection ? -Double vision ? -Potential unknown long term risks ? ?Contraindications: You should not have Botox if you are pregnant, nursing, allergic to albumin, have an infection, skin condition, or muscle weakness at the site of the injection, or have myasthenia gravis, Lambert-Eaton syndrome, or ALS. ? ?It is also possible that as  with any injection, there may be an allergic reaction or no effect from the medication. Reduced effectiveness after repeated injections is sometimes seen and rarely infection at the injection site may occur. All care will be taken to prevent these side effects. If therapy is given over a long time, atrophy and wasting in the muscle injected may occur. Occasionally the patient's become refractory to treatment because they develop antibodies to the toxin. In this event, therapy needs to be modified. ? ?I have read the above information and consent to the administration of botulism toxin. ? ? ? ?BOTOX PROCEDURE NOTE FOR MIGRAINE HEADACHE ? ? ? ?Contraindications and precautions discussed with patient(above). Aseptic procedure was observed and patient tolerated procedure. Procedure performed by Dr. Georgia Dom ? ?The condition has existed for more than 6 months, and pt does not have a diagnosis of ALS, Myasthenia Gravis or Lambert-Eaton Syndrome.  Risks and benefits of injections discussed and pt agrees to proceed with the procedure.  Written consent obtained ? ?These injections are medically necessary. Pt  receives good benefits from these injections. These injections do not cause sedations or hallucinations which the oral therapies may cause. ? ?Description of procedure: ? ?The patient was placed in a sitting position. The standard protocol was used for Botox as follows, with 5 units of Botox injected at each site: ? ? ?-Procerus muscle, midline injection ? ?-Corrugator muscle, bilateral injection ? ?-Frontalis muscle, bilateral injection, with 2 sites each side, medial injection was performed in the upper one third of the frontalis muscle, in the region vertical from the medial inferior edge of the  superior orbital rim. The lateral injection was again in the upper one third of the forehead vertically above the lateral limbus of the cornea, 1.5 cm lateral to the medial injection site. ? ?-Temporalis muscle injection, 4  sites, bilaterally. The first injection was 3 cm above the tragus of the ear, second injection site was 1.5 cm to 3 cm up from the first injection site in line with the tragus of the ear. The third injection site was 1.5-3 cm forward between the first 2 injection sites. The fourth injection site was 1.5 cm posterior to the second injection site.  ? ?-Occipitalis muscle injection, 3 sites, bilaterally. The first injection was done one half way between the occipital protuberance and the tip of the mastoid process behind the ear. The second injection site was done lateral and superior to the first, 1 fingerbreadth from the first injection. The third injection site was 1 fingerbreadth superiorly and medially from the first injection site. ? ?-Cervical paraspinal muscle injection, 2 sites, bilateral knee first injection site was 1 cm from the midline of the cervical spine, 3 cm inferior to the lower border of the occipital protuberance. The second injection site was 1.5 cm superiorly and laterally to the first injection site. ? ?-Trapezius muscle injection was performed at 3 sites, bilaterally. The first injection site was in the upper trapezius muscle halfway between the inflection point of the neck, and the acromion. The second injection site was one half way between the acromion and the first injection site. The third injection was done between the first injection site and the inflection point of the neck. ? ? ?Will return for repeat injection in 3 months. ? ? ?155 units of Botox was used, 45U Botox not injected was wasted. The patient tolerated the procedure well, there were no complications of the above procedure. ? ? ? ?

## 2021-03-23 ENCOUNTER — Other Ambulatory Visit: Payer: Self-pay | Admitting: Neurology

## 2021-03-23 MED ORDER — CYCLOBENZAPRINE HCL 10 MG PO TABS: 10.0000 mg | ORAL_TABLET | Freq: Every day | ORAL | 3 refills | Status: DC

## 2021-04-06 ENCOUNTER — Encounter: Payer: Self-pay | Admitting: Family Medicine

## 2021-04-06 DIAGNOSIS — F419 Anxiety disorder, unspecified: Secondary | ICD-10-CM

## 2021-04-06 DIAGNOSIS — F902 Attention-deficit hyperactivity disorder, combined type: Secondary | ICD-10-CM

## 2021-04-06 NOTE — Telephone Encounter (Signed)
I do not see Concerta on the pt's current medication list.  Charyl Bigger, CMA ?

## 2021-04-07 MED ORDER — METHYLPHENIDATE HCL ER (OSM) 27 MG PO TBCR: 27.0000 mg | EXTENDED_RELEASE_TABLET | ORAL | 0 refills | Status: DC

## 2021-04-07 MED ORDER — FLUOXETINE HCL 20 MG PO TABS: 40.0000 mg | ORAL_TABLET | Freq: Every day | ORAL | 1 refills | Status: DC

## 2021-04-07 NOTE — Telephone Encounter (Signed)
Meds ordered this encounter  ?Medications  ? FLUoxetine (PROZAC) 20 MG tablet  ?  Sig: Take 2 tablets (40 mg total) by mouth daily.  ?  Dispense:  180 tablet  ?  Refill:  1  ? methylphenidate (CONCERTA) 27 MG PO CR tablet  ?  Sig: Take 1 tablet (27 mg total) by mouth every morning.  ?  Dispense:  30 tablet  ?  Refill:  0  ? ? ?

## 2021-04-13 MED ORDER — VALACYCLOVIR HCL 500 MG PO TABS: 500.0000 mg | ORAL_TABLET | Freq: Two times a day (BID) | ORAL | 0 refills | Status: DC

## 2021-05-05 ENCOUNTER — Telehealth: Payer: 59 | Admitting: Family Medicine

## 2021-05-05 ENCOUNTER — Encounter: Payer: Self-pay | Admitting: Family Medicine

## 2021-05-05 VITALS — Temp 98.7°F | Wt 165.0 lb

## 2021-05-05 DIAGNOSIS — K5909 Other constipation: Secondary | ICD-10-CM

## 2021-05-05 DIAGNOSIS — F902 Attention-deficit hyperactivity disorder, combined type: Secondary | ICD-10-CM | POA: Diagnosis not present

## 2021-05-05 DIAGNOSIS — F419 Anxiety disorder, unspecified: Secondary | ICD-10-CM

## 2021-05-05 DIAGNOSIS — Z13220 Encounter for screening for lipoid disorders: Secondary | ICD-10-CM | POA: Diagnosis not present

## 2021-05-05 MED ORDER — LUBIPROSTONE 24 MCG PO CAPS: 24.0000 ug | ORAL_CAPSULE | Freq: Two times a day (BID) | ORAL | 1 refills | Status: DC

## 2021-05-05 MED ORDER — METHYLPHENIDATE HCL ER (OSM) 27 MG PO TBCR: 27.0000 mg | EXTENDED_RELEASE_TABLET | ORAL | 0 refills | Status: DC

## 2021-05-05 NOTE — Progress Notes (Signed)
Pt reports that she is doing well on the Concerta. ? ?Pt stated that she is still experiencing BM issues since she had surgery to remove her hemorrhoids ?

## 2021-05-05 NOTE — Progress Notes (Addendum)
? ? ? ? ?Virtual Visit via Video Note ? ?I connected with Lacey Jensen on 05/09/21 at  1:00 PM EDT by a video enabled telemedicine application and verified that I am speaking with the correct person using two identifiers. ?  ?I discussed the limitations of evaluation and management by telemedicine and the availability of in person appointments. The patient expressed understanding and agreed to proceed. ? ?Patient location: at home/beach ?Provider location: in office ? ? ? ? ?Established Patient Office Visit ? ?Subjective:  ?Patient ID: Lacey Jensen, female    DOB: 22-Sep-1965  Age: 56 y.o. MRN: [id number] ? ?CC:  ?Chief Complaint  ?Patient presents with  ? Medication Refill  ? ? ?HPI ?Lacey Jensen presents for  ? ?ADD - Reports symptoms are well controlled on current regime. Denies any problems with insomnia, chest pain, palpitations, or SOB.  Currently on Concerta 27 mg extended release daily. ? ?F/U Anxiety -currently on fluoxetine 20 mg daily. ? ?Says her stools are hard balls. Not really better after her hemorrhoid surgery.  No chang with fiber.  Drinking 64 oz fo water. Linzess didn't help. Hasn't tried [place].  ? ?Past Medical History:  ?Diagnosis Date  ? Abnormal Pap smear   ? Age 66 and age 34  ? Allergy   ? seasonal  ? Dysplasia of cervix   ? leep  ? Headache(784.0)   ? Immunotherapy   ? Osteoporosis   ? ? ?Past Surgical History:  ?Procedure Laterality Date  ? A GYN surgery  2002  ? btl    ? ? ?Family History  ?Problem Relation Age of Onset  ? Migraines Son   ? Colon cancer Neg Hx   ? Colon polyps Neg Hx   ? Esophageal cancer Neg Hx   ? Rectal cancer Neg Hx   ? Stomach cancer Neg Hx   ? ? ?Social History  ? ?Socioeconomic History  ? Marital status: Married  ?  Spouse name: Gwyndolyn Saxon  ? Number of children: 2  ? Years of education: Not on file  ? Highest education level: Master's degree (e.g., MA, MS, MEng, MEd, MSW, MBA)  ?Occupational History  ? Occupation: Art gallery manager  ?Tobacco Use  ? Smoking status:  Former  ?  Types: Cigarettes  ?  Quit date: 01/23/2004  ?  Years since quitting: 17.2  ? Smokeless tobacco: Never  ?Vaping Use  ? Vaping Use: Never used  ?Substance and Sexual Activity  ? Alcohol use: Yes  ?  Alcohol/week: 1.0 standard drink  ?  Types: 1 Standard drinks or equivalent per week  ? Drug use: No  ? Sexual activity: Yes  ?  Birth control/protection: None  ?  Comment: IN MEN[place]AUSE  ?Other Topics Concern  ? Not on file  ?Social History Narrative  ? She was adopted so we do not have an extensive family history. She does yoga for exercise and drinks 2 caffeinated drinks per day.  ?   ? Lives at home with her husband & 3 dogs  ? Right handed  ? ?Social Determinants of Health  ? ?Financial Resource Strain: Not on file  ?Food Insecurity: Not on file  ?Transportation Needs: Not on file  ?Physical Activity: Not on file  ?Stress: Not on file  ?Social Connections: Not on file  ?Intimate Partner Violence: Not on file  ? ? ?Outpatient Medications Prior to Visit  ?Medication Sig Dispense Refill  ? azelastine (ASTELIN) 0.1 % nasal spray as needed.     ?  botulinum toxin Type A (BOTOX) 100 units SOLR injection Inject 155 units into the muscles of the head and neck every 3 months. To be injected by the provider. Discard remainder. 2 each 1  ? CALCIUM-VITAMIN D PO Take 1 tablet by mouth daily.    ? cyclobenzaprine (FLEXERIL) 10 MG tablet Take 1 tablet (10 mg total) by mouth at bedtime. 90 tablet 3  ? FIBER PO Take by mouth.    ? FLUoxetine (PROZAC) 20 MG tablet Take 2 tablets (40 mg total) by mouth daily. 180 tablet 1  ? gabapentin (NEURONTIN) 100 MG capsule TAKE 1 TO 3 CAPSULES(100 TO 300 MG) BY MOUTH AT BEDTIME 90 capsule 0  ? MAGNESIUM PO Take by mouth.     ? valACYclovir (VALTREX) 500 MG tablet Take 1 tablet (500 mg total) by mouth 2 (two) times daily. 60 tablet 0  ? methylphenidate (CONCERTA) 27 MG PO CR tablet Take 1 tablet (27 mg total) by mouth every morning. 30 tablet 0  ? ?No facility-administered medications prior  to visit.  ? ? ?Allergies  ?Allergen Reactions  ? Ambien [Zolpidem] Other (See Comments)  ?  Sleep walking and memory loss  ? Belsomra [Suvorexant] Other (See Comments)  ?  Bad dreams, sleep walking  ? Sumatriptan Succinate   ?  REACTION: Made her feel high  ? Zolmitriptan   ?  REACTION: Made her feel high  ? ? ?ROS ?Review of Systems ? ?  ?Objective:  ?  ?Physical Exam ?Vitals reviewed.  ?Constitutional:   ?   Appearance: She is well-developed.  ?HENT:  ?   Head: Normocephalic and atraumatic.  ?Eyes:  ?   Conjunctiva/sclera: Conjunctivae normal.  ?Cardiovascular:  ?   Rate and Rhythm: Normal rate.  ?Pulmonary:  ?   Effort: Pulmonary effort is normal.  ?Skin: ?   General: Skin is dry.  ?Neurological:  ?   Mental Status: She is alert and oriented to person, place, and time.  ?Psychiatric:     ?   Behavior: Behavior normal.  ? ? ?Temp 98.7 ?F (37.1 ?C)   Wt 165 lb (74.8 kg)   LMP 11/23/2015   BMI 24.37 kg/m?  ?Wt Readings from Last 3 Encounters:  ?05/05/21 165 lb (74.8 kg)  ?03/06/21 165 lb (74.8 kg)  ?12/05/20 160 lb 6.4 oz (72.8 kg)  ? ? ? ?Health Maintenance Due  ?Topic Date Due  ? Hepatitis C Screening  Never done  ? MAMMOGRAM  10/19/2018  ? ? ?There are no preventive care reminders to display for this patient. ? ?Lab Results  ?Component Value Date  ? TSH 2.250 06/04/2018  ? ?Lab Results  ?Component Value Date  ? WBC 5.3 06/04/2018  ? HGB 13.1 06/04/2018  ? HCT 39.0 06/04/2018  ? MCV 89 06/04/2018  ? PLT 242 06/04/2018  ? ?Lab Results  ?Component Value Date  ? NA 140 06/04/2018  ? K 5.2 06/04/2018  ? CO2 26 06/04/2018  ? GLUCOSE 83 06/04/2018  ? BUN 14 06/04/2018  ? CREATININE 0.66 06/04/2018  ? BILITOT 0.3 06/04/2018  ? ALKPHOS 116 06/04/2018  ? AST 28 06/04/2018  ? ALT 27 06/04/2018  ? PROT 7.3 06/04/2018  ? ALBUMIN 4.7 06/04/2018  ? CALCIUM 10.1 06/04/2018  ? ?Lab Results  ?Component Value Date  ? CHOL 183 09/17/2012  ? ?Lab Results  ?Component Value Date  ? HDL 64 09/17/2012  ? ?Lab Results  ?Component  Value Date  ? LDLCALC 101 (H) 09/17/2012  ? ?  Lab Results  ?Component Value Date  ? TRIG 90 09/17/2012  ? ?Lab Results  ?Component Value Date  ? CHOLHDL 2.9 09/17/2012  ? ?No results found for: HGBA1C ? ?  ?Assessment & Plan:  ? ?Problem List Items Addressed This Visit   ? ?  ? Digestive  ? Chronic constipation  ?  Discussed options.  Recommended trial of MiraLAX nightly for 30 days to see if she feels like this is effective if not then we can try Amitiza.  She has not tried that one yet and at least does have a generic available so I think it could be helpful she is really doing a great job with increased hydration.  She was really hoping when she had the hemorrhoid surgery that it would make a big difference but unfortunately has not but she has had improved pain with having had the hemorrhoids removed.  We also discussed maybe even considering Trulance if the Amitiza is not effective. ?  ?  ? Relevant Orders  ? COMPLETE METABOLIC PANEL WITH GFR  ? CBC  ? TSH  ?  ? Other  ? Attention deficit hyperactivity disorder (ADHD) - Primary  ?  Stable on her current regimen.  Next 90 days sent to pharmacy. ?  ?  ? Relevant Medications  ? methylphenidate (CONCERTA) 27 MG PO CR tablet (Start on 07/05/2021)  ? methylphenidate (CONCERTA) 27 MG PO CR tablet (Start on 06/05/2021)  ? methylphenidate (CONCERTA) 27 MG PO CR tablet (Start on 05/07/2021)  ? Anxiety  ? ?Other Visit Diagnoses   ? ? Screening, lipid      ? Relevant Orders  ? Lipid Panel w/reflex Direct LDL  ? COMPLETE METABOLIC PANEL WITH GFR  ? ?  ? ?Reminded her to schedule her mammogram when she is back in town. ? ? ?Meds ordered this encounter  ?Medications  ? lubiprostone (AMITIZA) 24 MCG capsule  ?  Sig: Take 1 capsule (24 mcg total) by mouth 2 (two) times daily with a meal.  ?  Dispense:  60 capsule  ?  Refill:  1  ?  Pt will call when needed.  ? methylphenidate (CONCERTA) 27 MG PO CR tablet  ?  Sig: Take 1 tablet (27 mg total) by mouth every morning.  ?  Dispense:   30 tablet  ?  Refill:  0  ? methylphenidate (CONCERTA) 27 MG PO CR tablet  ?  Sig: Take 1 tablet (27 mg total) by mouth every morning.  ?  Dispense:  30 tablet  ?  Refill:  0  ? methylphenidate (CONCERTA) 2

## 2021-05-05 NOTE — Assessment & Plan Note (Signed)
Stable on her current regimen.  Next 90 days sent to pharmacy. ?

## 2021-05-05 NOTE — Assessment & Plan Note (Signed)
Discussed options.  Recommended trial of MiraLAX nightly for 30 days to see if she feels like this is effective if not then we can try Amitiza.  She has not tried that one yet and at least does have a generic available so I think it could be helpful she is really doing a great job with increased hydration.  She was really hoping when she had the hemorrhoid surgery that it would make a big difference but unfortunately has not but she has had improved pain with having had the hemorrhoids removed.  We also discussed maybe even considering Trulance if the Amitiza is not effective. ?

## 2021-05-09 ENCOUNTER — Telehealth: Payer: Self-pay

## 2021-05-09 NOTE — Telephone Encounter (Signed)
Initiated Prior authorization VHO:[id number] 24MCG capsules ? ?Via: Covermymeds ?Case/Key:[id number] ?Status: approved as of 05/09/21 ?Reason:approved through 05/10/2022.  ?Notified Pt via: Mychart ?

## 2021-05-10 ENCOUNTER — Encounter: Payer: Self-pay | Admitting: Family Medicine

## 2021-05-10 ENCOUNTER — Ambulatory Visit: Payer: 59 | Admitting: Family Medicine

## 2021-05-10 VITALS — BP 119/81 | HR 94 | Temp 98.5°F

## 2021-05-10 DIAGNOSIS — J019 Acute sinusitis, unspecified: Secondary | ICD-10-CM

## 2021-05-10 DIAGNOSIS — J029 Acute pharyngitis, unspecified: Secondary | ICD-10-CM | POA: Diagnosis not present

## 2021-05-10 DIAGNOSIS — R051 Acute cough: Secondary | ICD-10-CM | POA: Diagnosis not present

## 2021-05-10 LAB — POCT RAPID STREP A (OFFICE): Rapid Strep A Screen: NEGATIVE

## 2021-05-10 MED ORDER — AZITHROMYCIN 250 MG PO TABS: ORAL_TABLET | ORAL | 0 refills | Status: AC

## 2021-05-10 NOTE — Progress Notes (Signed)
? ?Acute Office Visit ? ?Subjective:  ? ?  ?Patient ID: Lacey Jensen, female    DOB: Sep 01, 1965, 56 y.o.   MRN: [id number] ? ?Chief Complaint  ?Patient presents with  ? Sore Throat  ? ? ?HPI ?Patient is in today for Coughing that started about 4 days ago.  She is headed a lot of pressure in her forehead and facial cheeks and sinus cavity.  She has not had any significant nasal congestion but feels like she is had a lot of congestion in her chest dn thick post nasal drip  She says sometimes she will even cough up globs of mucus.  No fevers or nausea.  She has been exposed to strep throat she was around her granddaughter's all last week and they were all around her.  And also her daughter just tested positive for strep a couple days ago.  He does not have a severe sore throat. ? ?ROS ? ? ?   ?Objective:  ?  ?BP 119/81   Pulse 94   Temp 98.5 ?F (36.9 ?C)   LMP 11/23/2015   SpO2 95%  ? ? ?Physical Exam ?Constitutional:   ?   Appearance: She is well-developed.  ?HENT:  ?   Head: Normocephalic and atraumatic.  ?   Right Ear: External ear normal.  ?   Left Ear: External ear normal.  ?   Nose: Nose normal.  ?   Mouth/Throat:  ?   Pharynx: Posterior oropharyngeal erythema present. No oropharyngeal exudate.  ?Eyes:  ?   Conjunctiva/sclera: Conjunctivae normal.  ?   Pupils: Pupils are equal, round, and reactive to light.  ?Neck:  ?   Thyroid: No thyromegaly.  ?Cardiovascular:  ?   Rate and Rhythm: Normal rate and regular rhythm.  ?   Heart sounds: Normal heart sounds.  ?Pulmonary:  ?   Effort: Pulmonary effort is normal.  ?   Breath sounds: Normal breath sounds. No wheezing.  ?Musculoskeletal:  ?   Cervical back: Neck supple.  ?Lymphadenopathy:  ?   Cervical: No cervical adenopathy.  ?Skin: ?   General: Skin is warm and dry.  ?Neurological:  ?   Mental Status: She is alert and oriented to person, place, and time.  ? ? ?Results for orders placed or performed in visit on 05/10/21  ?POCT rapid strep A  ?Result Value Ref Range   ? Rapid Strep A Screen Negative Negative  ? ? ? ?   ?Assessment & Plan:  ? ?Problem List Items Addressed This Visit   ?None ?Visit Diagnoses   ? ? Sore throat    -  Primary  ? Relevant Orders  ? POCT rapid strep A (Completed)  ? Acute cough      ? Acute non-recurrent sinusitis, unspecified location      ? Relevant Medications  ? azithromycin (ZITHROMAX) 250 MG tablet  ? ?  ? ?Did test for strep with exposure. Test was negative.  Will tx for sinusitis since she has a lot of facial pressure and pain and she is congested.  Chest is clear on exam today so I do not hear any evidence of bronchitis.  If not getting better over the next week please let us know.  Always consider still could be a viral illness. ? ?Did encourage her to schedule her mammogram as well. ? ?Meds ordered this encounter  ?Medications  ? azithromycin (ZITHROMAX) 250 MG tablet  ?  Sig: 2 Ttabs PO on Day 1, then one a  day x 4 days.  ?  Dispense:  6 tablet  ?  Refill:  0  ? ? ?No follow-ups on file. ? ?Beatrice Lecher, MD ? ? ?

## 2021-06-08 MED ORDER — BOTOX 100 UNITS IJ SOLR
INTRAMUSCULAR | 1 refills | Status: DC
Start: 2021-06-08 — End: 2021-07-11

## 2021-06-08 NOTE — Addendum Note (Signed)
Addended by: Gildardo Griffes on: 06/08/2021 10:13 AM   Modules accepted: Orders

## 2021-06-08 NOTE — Telephone Encounter (Signed)
Please send Botox Rx to [facility].

## 2021-06-20 ENCOUNTER — Ambulatory Visit: Payer: Self-pay | Admitting: Neurology

## 2021-06-27 ENCOUNTER — Encounter: Payer: Self-pay | Admitting: Neurology

## 2021-07-05 NOTE — Telephone Encounter (Signed)
Pt has new insurance completed [employer] Botox PA form placed in Nurse pod for MD signature.

## 2021-07-05 NOTE — Telephone Encounter (Signed)
Faxed signed PA form with OV notes to [employer]. ?

## 2021-07-11 MED ORDER — BOTOX 100 UNITS IJ SOLR: INTRAMUSCULAR | 3 refills | Status: DC

## 2021-07-11 NOTE — Addendum Note (Signed)
Addended by: Skeet Simmer A on: 07/11/2021 03:50 PM   Modules accepted: Orders

## 2021-07-11 NOTE — Telephone Encounter (Addendum)
Received approval from [place], [place] # [id number] (07/05/2021-06/05/2022). Please send Botox RX to [facility].

## 2021-07-11 NOTE — Telephone Encounter (Signed)
Faxed signed PA form with OV noes to [place].

## 2021-07-18 ENCOUNTER — Other Ambulatory Visit: Payer: Self-pay | Admitting: Family Medicine

## 2021-07-19 ENCOUNTER — Ambulatory Visit: Payer: Self-pay | Admitting: Neurology

## 2021-07-19 DIAGNOSIS — G43711 Chronic migraine without aura, intractable, with status migrainosus: Secondary | ICD-10-CM

## 2021-07-19 NOTE — Progress Notes (Signed)
Consent Form Botulism Toxin Injection For Chronic Migraine    07/19/2021: stable. SAMPLES USED TODAY NO CHARGE 03/22/2021: stable Interval history 12/08/2020: Stable, > 80% improvement migraine and headache freq and severity. +a. 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   Reviewed orally with patient, additionally signature is on file:  Consent Form Botulism Toxin Injection For Chronic Migraine    Reviewed orally with patient, additionally signature is on file:  Botulism toxin has been approved by the Federal drug administration for treatment of chronic migraine. Botulism toxin does not cure chronic migraine and it may not be effective in some patients.  The administration of botulism toxin is accomplished by injecting a small amount of toxin into the muscles of the neck and head. Dosage must be titrated for each individual. Any benefits resulting from botulism toxin tend to wear off after 3 months with a repeat injection required if benefit is to be maintained. Injections are usually done every 3-4 months with maximum effect peak achieved by about 2 or 3 weeks. Botulism toxin is expensive and you should be sure of what costs you will incur resulting from the injection.  The side effects of botulism toxin use for chronic migraine may include:   -Transient, and usually mild, facial weakness with facial injections  -Transient, and usually mild, head or neck weakness with head/neck injections  -Reduction or loss of forehead facial animation due to forehead muscle weakness  -Eyelid drooping  -Dry eye  -Pain at the site of injection or bruising at the site of injection  -Double vision  -Potential unknown long term risks  Contraindications: You should not have Botox if you are pregnant, nursing, allergic to albumin, have an infection, skin condition, or muscle weakness at the site of the injection, or have myasthenia gravis, Lambert-Eaton  syndrome, or ALS.  It is also possible that as with any injection, there may be an allergic reaction or no effect from the medication. Reduced effectiveness after repeated injections is sometimes seen and rarely infection at the injection site may occur. All care will be taken to prevent these side effects. If therapy is given over a long time, atrophy and wasting in the muscle injected may occur. Occasionally the patient's become refractory to treatment because they develop antibodies to the toxin. In this event, therapy needs to be modified.  I have read the above information and consent to the administration of botulism toxin.    BOTOX PROCEDURE NOTE FOR MIGRAINE HEADACHE    Contraindications and precautions discussed with patient(above). Aseptic procedure was observed and patient tolerated procedure. Procedure performed by Dr. Georgia Dom  The condition has existed for more than 6 months, and pt does not have a diagnosis of ALS, Myasthenia Gravis or Lambert-Eaton Syndrome.  Risks and benefits of injections discussed and pt agrees to proceed with the procedure.  Written consent obtained  These injections are medically necessary. Pt  receives good benefits from these injections. These injections do not cause sedations or hallucinations which the oral therapies may cause.  Description of procedure:  The patient was placed in a sitting position. The standard protocol was used for Botox as follows, with 5 units of Botox injected at each site:   -Procerus muscle, midline injection  -Corrugator muscle, bilateral injection  -Frontalis muscle, bilateral injection, with 2 sites each side, medial injection was performed in the upper one third of the frontalis muscle, in the region vertical  from the medial inferior edge of the superior orbital rim. The lateral injection was again in the upper one third of the forehead vertically above the lateral limbus of the cornea, 1.5 cm lateral to the medial  injection site.  -Temporalis muscle injection, 4 sites, bilaterally. The first injection was 3 cm above the tragus of the ear, second injection site was 1.5 cm to 3 cm up from the first injection site in line with the tragus of the ear. The third injection site was 1.5-3 cm forward between the first 2 injection sites. The fourth injection site was 1.5 cm posterior to the second injection site.   -Occipitalis muscle injection, 3 sites, bilaterally. The first injection was done one half way between the occipital protuberance and the tip of the mastoid process behind the ear. The second injection site was done lateral and superior to the first, 1 fingerbreadth from the first injection. The third injection site was 1 fingerbreadth superiorly and medially from the first injection site.  -Cervical paraspinal muscle injection, 2 sites, bilateral knee first injection site was 1 cm from the midline of the cervical spine, 3 cm inferior to the lower border of the occipital protuberance. The second injection site was 1.5 cm superiorly and laterally to the first injection site.  -Trapezius muscle injection was performed at 3 sites, bilaterally. The first injection site was in the upper trapezius muscle halfway between the inflection point of the neck, and the acromion. The second injection site was one half way between the acromion and the first injection site. The third injection was done between the first injection site and the inflection point of the neck.   Will return for repeat injection in 3 months.   155 units of Botox was used, 45U Botox not injected was wasted. The patient tolerated the procedure well, there were no complications of the above procedure.

## 2021-07-19 NOTE — Progress Notes (Signed)
SAMPLES USED TODAY NO CHARGE  Botox- 200 units x 1 vial Lot: [id number] Expiration: 02/2024 NDC: 0459-1368-59  Bacteriostatic 0.9% Sodium Chloride- 52m total Lot: [id number] Expiration: 08/23/2022 NDC: [address]-60 Dx: [id number]

## 2021-07-24 ENCOUNTER — Other Ambulatory Visit: Payer: Self-pay | Admitting: *Deleted

## 2021-07-24 DIAGNOSIS — F419 Anxiety disorder, unspecified: Secondary | ICD-10-CM

## 2021-07-24 MED ORDER — FLUOXETINE HCL 20 MG PO TABS: 40.0000 mg | ORAL_TABLET | Freq: Every day | ORAL | 1 refills | Status: DC

## 2021-07-26 DIAGNOSIS — G43709 Chronic migraine without aura, not intractable, without status migrainosus: Secondary | ICD-10-CM | POA: Diagnosis not present

## 2021-07-27 ENCOUNTER — Telehealth: Payer: Self-pay | Admitting: *Deleted

## 2021-07-27 DIAGNOSIS — F902 Attention-deficit hyperactivity disorder, combined type: Secondary | ICD-10-CM

## 2021-07-27 MED ORDER — METHYLPHENIDATE HCL ER (LA) 30 MG PO CP24: 30.0000 mg | ORAL_CAPSULE | ORAL | 0 refills | Status: DC

## 2021-07-27 NOTE — Telephone Encounter (Signed)
Left message advising of the prescription.  

## 2021-07-27 NOTE — Telephone Encounter (Signed)
Pt called stating that the pharmacy informed her that the Concerta 27 mg is on backorder and she can get the generic filled.   Routing to pcp for refill

## 2021-07-27 NOTE — Telephone Encounter (Signed)
Meds ordered this encounter  Medications   methylphenidate (RITALIN LA) 30 MG 24 hr capsule    Sig: Take 1 capsule (30 mg total) by mouth every morning.    Dispense:  30 capsule    Refill:  0

## 2021-07-27 NOTE — Telephone Encounter (Signed)
Received (2) 100 unit vials of Botox from [facility].

## 2021-08-10 ENCOUNTER — Other Ambulatory Visit: Payer: Self-pay | Admitting: Neurology

## 2021-08-10 NOTE — Telephone Encounter (Signed)
I called and LMVM for pt to call back.  Is she taking this?    Last Epic note I see cancelled 03-06-21 when in ED.  (Not taking).  [place] drug registry checked last fill 02-23-2021 #20.

## 2021-08-28 ENCOUNTER — Telehealth: Payer: Self-pay | Admitting: Neurology

## 2021-08-28 NOTE — Telephone Encounter (Signed)
[name]) would like a call back to verify ICD-10 code.

## 2021-08-30 NOTE — Telephone Encounter (Signed)
Called [facility] gave them the information that was needed.

## 2021-09-05 ENCOUNTER — Ambulatory Visit: Payer: BC Managed Care – PPO | Admitting: Neurology

## 2021-09-05 ENCOUNTER — Telehealth: Payer: Self-pay | Admitting: Neurology

## 2021-09-05 NOTE — Telephone Encounter (Signed)
LVM and sent mychart msg informing pt of r/s needed for today's Botox- MD out.

## 2021-09-14 ENCOUNTER — Encounter: Payer: Self-pay | Admitting: Neurology

## 2021-09-19 ENCOUNTER — Telehealth: Payer: Self-pay

## 2021-09-19 DIAGNOSIS — G43709 Chronic migraine without aura, not intractable, without status migrainosus: Secondary | ICD-10-CM | POA: Diagnosis not present

## 2021-09-19 NOTE — Telephone Encounter (Signed)
I called [facility]. I spoke with Reniel. I scheduled botox delivery for 09/20/21. The reference # for this call is Reniel, A 8/29.

## 2021-09-20 ENCOUNTER — Encounter: Payer: Self-pay | Admitting: Neurology

## 2021-09-20 ENCOUNTER — Ambulatory Visit: Payer: BC Managed Care – PPO | Admitting: Neurology

## 2021-09-20 DIAGNOSIS — G43711 Chronic migraine without aura, intractable, with status migrainosus: Secondary | ICD-10-CM | POA: Diagnosis not present

## 2021-09-20 MED ORDER — ONABOTULINUMTOXINA 100 UNITS IJ SOLR
155.0000 [IU] | Freq: Once | INTRAMUSCULAR | Status: AC
  Administered 2021-09-20: 155 [IU] via INTRAMUSCULAR

## 2021-09-20 NOTE — Progress Notes (Signed)
Botox- 100 units x 2 vials Lot: [id number] Expiration: 11/2023 NDC: 2301-7209-10  Bacteriostatic 0.9% Sodium Chloride- 53m total Lot: [id number]: 08/23/2022 NDC: [address]-86 Dx: [id number]/P

## 2021-09-20 NOTE — Progress Notes (Signed)
Consent Form Botulism Toxin Injection For Chronic Migraine    09/20/2021: doing well, stable 07/19/2021: stable. 03/22/2021: stable Interval history 12/08/2020: Stable, > 80% improvement migraine and headache freq and severity. +a. 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   Reviewed orally with patient, additionally signature is on file:  Consent Form Botulism Toxin Injection For Chronic Migraine    Reviewed orally with patient, additionally signature is on file:  Botulism toxin has been approved by the Federal drug administration for treatment of chronic migraine. Botulism toxin does not cure chronic migraine and it may not be effective in some patients.  The administration of botulism toxin is accomplished by injecting a small amount of toxin into the muscles of the neck and head. Dosage must be titrated for each individual. Any benefits resulting from botulism toxin tend to wear off after 3 months with a repeat injection required if benefit is to be maintained. Injections are usually done every 3-4 months with maximum effect peak achieved by about 2 or 3 weeks. Botulism toxin is expensive and you should be sure of what costs you will incur resulting from the injection.  The side effects of botulism toxin use for chronic migraine may include:   -Transient, and usually mild, facial weakness with facial injections  -Transient, and usually mild, head or neck weakness with head/neck injections  -Reduction or loss of forehead facial animation due to forehead muscle weakness  -Eyelid drooping  -Dry eye  -Pain at the site of injection or bruising at the site of injection  -Double vision  -Potential unknown long term risks  Contraindications: You should not have Botox if you are pregnant, nursing, allergic to albumin, have an infection, skin condition, or muscle weakness at the site of the injection, or have myasthenia gravis, Lambert-Eaton  syndrome, or ALS.  It is also possible that as with any injection, there may be an allergic reaction or no effect from the medication. Reduced effectiveness after repeated injections is sometimes seen and rarely infection at the injection site may occur. All care will be taken to prevent these side effects. If therapy is given over a long time, atrophy and wasting in the muscle injected may occur. Occasionally the patient's become refractory to treatment because they develop antibodies to the toxin. In this event, therapy needs to be modified.  I have read the above information and consent to the administration of botulism toxin.    BOTOX PROCEDURE NOTE FOR MIGRAINE HEADACHE    Contraindications and precautions discussed with patient(above). Aseptic procedure was observed and patient tolerated procedure. Procedure performed by Dr. Georgia Dom  The condition has existed for more than 6 months, and pt does not have a diagnosis of ALS, Myasthenia Gravis or Lambert-Eaton Syndrome.  Risks and benefits of injections discussed and pt agrees to proceed with the procedure.  Written consent obtained  These injections are medically necessary. Pt  receives good benefits from these injections. These injections do not cause sedations or hallucinations which the oral therapies may cause.  Description of procedure:  The patient was placed in a sitting position. The standard protocol was used for Botox as follows, with 5 units of Botox injected at each site:   -Procerus muscle, midline injection  -Corrugator muscle, bilateral injection  -Frontalis muscle, bilateral injection, with 2 sites each side, medial injection was performed in the upper one third of the frontalis muscle, in the region vertical from  the medial inferior edge of the superior orbital rim. The lateral injection was again in the upper one third of the forehead vertically above the lateral limbus of the cornea, 1.5 cm lateral to the medial  injection site.  -Temporalis muscle injection, 4 sites, bilaterally. The first injection was 3 cm above the tragus of the ear, second injection site was 1.5 cm to 3 cm up from the first injection site in line with the tragus of the ear. The third injection site was 1.5-3 cm forward between the first 2 injection sites. The fourth injection site was 1.5 cm posterior to the second injection site.   -Occipitalis muscle injection, 3 sites, bilaterally. The first injection was done one half way between the occipital protuberance and the tip of the mastoid process behind the ear. The second injection site was done lateral and superior to the first, 1 fingerbreadth from the first injection. The third injection site was 1 fingerbreadth superiorly and medially from the first injection site.  -Cervical paraspinal muscle injection, 2 sites, bilateral knee first injection site was 1 cm from the midline of the cervical spine, 3 cm inferior to the lower border of the occipital protuberance. The second injection site was 1.5 cm superiorly and laterally to the first injection site.  -Trapezius muscle injection was performed at 3 sites, bilaterally. The first injection site was in the upper trapezius muscle halfway between the inflection point of the neck, and the acromion. The second injection site was one half way between the acromion and the first injection site. The third injection was done between the first injection site and the inflection point of the neck.   Will return for repeat injection in 3 months.   155 units of Botox was used, 45U Botox not injected was wasted. The patient tolerated the procedure well, there were no complications of the above procedure.

## 2021-10-06 ENCOUNTER — Other Ambulatory Visit: Payer: Self-pay | Admitting: *Deleted

## 2021-10-06 DIAGNOSIS — F902 Attention-deficit hyperactivity disorder, combined type: Secondary | ICD-10-CM

## 2021-10-06 MED ORDER — METHYLPHENIDATE HCL ER (OSM) 27 MG PO TBCR: 27.0000 mg | EXTENDED_RELEASE_TABLET | ORAL | 0 refills | Status: DC

## 2021-10-06 NOTE — Telephone Encounter (Signed)
Pt advised via VM that the pharmacy did have the Methylphenidate 27 mg and will refill this. The co-pay is $20

## 2021-10-10 ENCOUNTER — Ambulatory Visit: Payer: BC Managed Care – PPO | Admitting: Neurology

## 2021-10-12 ENCOUNTER — Other Ambulatory Visit: Payer: Self-pay | Admitting: Neurology

## 2021-10-12 MED ORDER — LORAZEPAM 0.5 MG PO TABS: ORAL_TABLET | ORAL | 4 refills | Status: DC

## 2021-10-26 ENCOUNTER — Telehealth (INDEPENDENT_AMBULATORY_CARE_PROVIDER_SITE_OTHER): Payer: BC Managed Care – PPO | Admitting: Family Medicine

## 2021-10-26 DIAGNOSIS — J0100 Acute maxillary sinusitis, unspecified: Secondary | ICD-10-CM

## 2021-10-26 MED ORDER — AMOXICILLIN 875 MG PO TABS: 875.0000 mg | ORAL_TABLET | Freq: Two times a day (BID) | ORAL | 0 refills | Status: AC

## 2021-10-26 NOTE — Progress Notes (Signed)
    Virtual Visit via Video Note  I connected with Lacey Jensen on 10/26/21 at 10:30 AM EDT by a video enabled telemedicine application and verified that I am speaking with the correct person using two identifiers.   I discussed the limitations of evaluation and management by telemedicine and the availability of in person appointments. The patient expressed understanding and agreed to proceed.  Patient location: at home Provider location: in office  Subjective:    CC:  No chief complaint on file.   HPI: C/O sinus pain and pressure on the left sx 1 week.  Getting some blood.  + HA.  No fever.  Using nasal saline. No ST or ear pain. Feels more tired today.  Today missed work..some matted eyes. No known sick contracts. No GI sxs or rash. Appetite is good. No cough   Past medical history, Surgical history, Family history not pertinant except as noted below, Social history, Allergies, and medications have been entered into the medical record, reviewed, and corrections made.    Objective:    General: Speaking clearly in complete sentences without any shortness of breath.  Alert and oriented x3.  Normal judgment. No apparent acute distress.    Impression and Recommendations:    Problem List Items Addressed This Visit   None Visit Diagnoses     Acute non-recurrent maxillary sinusitis    -  Primary   Relevant Medications   amoxicillin (AMOXIL) 875 MG tablet       Acute sinusitis - since feeling worse today and sxs > 1 week will treat with Amox. Call if not improving or continuing to have bloody discharge. Rec symptomatic care and running a humidifier.   No orders of the defined types were placed in this encounter.   Meds ordered this encounter  Medications   amoxicillin (AMOXIL) 875 MG tablet    Sig: Take 1 tablet (875 mg total) by mouth 2 (two) times daily for 7 days.    Dispense:  14 tablet    Refill:  0   I spent 12 minutes on the day of the encounter to include  pre-visit record review, face-to-face time with the patient and post visit ordering of test.   I discussed the assessment and treatment plan with the patient. The patient was provided an opportunity to ask questions and all were answered. The patient agreed with the plan and demonstrated an understanding of the instructions.   The patient was advised to call back or seek an in-person evaluation if the symptoms worsen or if the condition fails to improve as anticipated.   Beatrice Lecher, MD

## 2021-11-02 ENCOUNTER — Other Ambulatory Visit: Payer: Self-pay | Admitting: Family Medicine

## 2021-11-03 ENCOUNTER — Other Ambulatory Visit: Payer: Self-pay | Admitting: Family Medicine

## 2021-11-03 DIAGNOSIS — F902 Attention-deficit hyperactivity disorder, combined type: Secondary | ICD-10-CM

## 2021-11-03 MED ORDER — METHYLPHENIDATE HCL ER (OSM) 27 MG PO TBCR: 27.0000 mg | EXTENDED_RELEASE_TABLET | ORAL | 0 refills | Status: DC

## 2021-11-03 MED ORDER — GABAPENTIN 100 MG PO CAPS
ORAL_CAPSULE | ORAL | 0 refills | Status: DC
Start: 2021-11-03 — End: 2023-05-29

## 2021-11-03 NOTE — Telephone Encounter (Signed)
Med sent.  Please call patient and have her schedule an ADD follow-up in about 3 to 4 weeks.  Meds ordered this encounter  Medications   methylphenidate (CONCERTA) 27 MG PO CR tablet    Sig: Take 1 tablet (27 mg total) by mouth every morning.    Dispense:  30 tablet    Refill:  0

## 2021-11-06 ENCOUNTER — Telehealth: Payer: Self-pay | Admitting: Neurology

## 2021-11-06 NOTE — Telephone Encounter (Signed)
Lacey Jensen is calling. Stated Pt is out of network with there [employer]. Pt is in network with [employer]. States if there are any question please call.

## 2021-11-16 ENCOUNTER — Ambulatory Visit: Payer: BC Managed Care – PPO | Admitting: Neurology

## 2021-11-22 ENCOUNTER — Telehealth: Payer: Self-pay | Admitting: Neurology

## 2021-11-22 NOTE — Telephone Encounter (Addendum)
Thank you :)

## 2021-11-22 NOTE — Telephone Encounter (Signed)
Called patient back and she will be here at 4:00PM tomorrow :)

## 2021-11-22 NOTE — Telephone Encounter (Signed)
It was for tomorrow, 11/2 at 4 pm. Will you see if she can make that?

## 2021-11-22 NOTE — Telephone Encounter (Signed)
Patient called in stated she had to cancel her previous Botox appointment on 10/26 due an unexpected emergency. She called and said someone had left her a message saying to come in another day but accidentally deleted it without writing it down and I don't see any documentation indicating when this would have been. Please advise or give patient a call back regarding this, thank you

## 2021-11-23 ENCOUNTER — Ambulatory Visit: Payer: BC Managed Care – PPO | Admitting: Neurology

## 2021-11-23 DIAGNOSIS — G43711 Chronic migraine without aura, intractable, with status migrainosus: Secondary | ICD-10-CM

## 2021-11-23 MED ORDER — UBRELVY 100 MG PO TABS: 100.0000 mg | ORAL_TABLET | ORAL | 0 refills | Status: DC | PRN

## 2021-11-23 MED ORDER — NURTEC 75 MG PO TBDP: 75.0000 mg | ORAL_TABLET | Freq: Every day | ORAL | 0 refills | Status: DC | PRN

## 2021-11-23 MED ORDER — ONABOTULINUMTOXINA 100 UNITS IJ SOLR
155.0000 [IU] | Freq: Once | INTRAMUSCULAR | Status: AC
  Administered 2021-11-23: 155 [IU] via INTRAMUSCULAR

## 2021-11-23 MED ORDER — ZAVZPRET 10 MG/ACT NA SOLN: 1.0000 | Freq: Once | NASAL | 0 refills | Status: AC

## 2021-11-23 NOTE — Progress Notes (Signed)
Botox- 100 units x 2 vials Lot: [id number] Expiration: 10/2023 NDC: 9672-8979-15  Bacteriostatic 0.9% Sodium Chloride- 12m total Lot: [id number]: 09/23/2022 NDC: [address]-68 Dx: [id number]/P

## 2021-11-23 NOTE — Progress Notes (Addendum)
Consent Form Botulism Toxin Injection For Chronic Migraine    11/23/2021: stable doing great. Lacey Jensen, nurtec,zavzpret samples to try. Since being on botox >>50% improvement in migraines only has 4 migraine days a month and < 10 total headache days a month she has tried imitrex,maxalt,ubrelvy,nurtec.  Meds ordered this encounter  Medications   botulinum toxin Type A (BOTOX) injection 155 Units    Botox- 100 units x 2 vials Lot: [id number] Expiration: 10/2023 NDC: 1601-0932-35  Bacteriostatic 0.9% Sodium Chloride- 85m total Lot: [id number]: 09/23/2022 NDC: [address]-37 Dx: [id number]/P   Zavegepant HCl (ZAVZPRET) 10 MG/ACT SOLN    Sig: Place 1 spray into the nose once for 1 dose.    Dispense:  1 each    Refill:  0   Rimegepant Sulfate (NURTEC) 75 MG TBDP    Sig: Take 75 mg by mouth daily as needed. For migraines. Take as close to onset of migraine as possible. One daily maximum.    Dispense:  2 tablet    Refill:  0   Ubrogepant (UBRELVY) 100 MG TABS    Sig: Take 100 mg by mouth every 2 (two) hours as needed. Maximum '200mg'$  a day.    Dispense:  2 tablet    Refill:  0    09/20/2021: doing well, stable 07/19/2021: stable. 03/22/2021: stable Interval history 12/08/2020: Stable, > 80% improvement migraine and headache freq and severity. +a. 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   Reviewed orally with patient, additionally signature is on file:  Consent Form Botulism Toxin Injection For Chronic Migraine    Reviewed orally with patient, additionally signature is on file:  Botulism toxin has been approved by the Federal drug administration for treatment of chronic migraine. Botulism toxin does not cure chronic migraine and it may not be effective in some patients.  The administration of botulism toxin is accomplished by injecting a small amount of toxin into the muscles of the neck and head. Dosage must be titrated  for each individual. Any benefits resulting from botulism toxin tend to wear off after 3 months with a repeat injection required if benefit is to be maintained. Injections are usually done every 3-4 months with maximum effect peak achieved by about 2 or 3 weeks. Botulism toxin is expensive and you should be sure of what costs you will incur resulting from the injection.  The side effects of botulism toxin use for chronic migraine may include:   -Transient, and usually mild, facial weakness with facial injections  -Transient, and usually mild, head or neck weakness with head/neck injections  -Reduction or loss of forehead facial animation due to forehead muscle weakness  -Eyelid drooping  -Dry eye  -Pain at the site of injection or bruising at the site of injection  -Double vision  -Potential unknown long term risks  Contraindications: You should not have Botox if you are pregnant, nursing, allergic to albumin, have an infection, skin condition, or muscle weakness at the site of the injection, or have myasthenia gravis, Lambert-Eaton syndrome, or ALS.  It is also possible that as with any injection, there may be an allergic reaction or no effect from the medication. Reduced effectiveness after repeated injections is sometimes seen and rarely infection at the injection site may occur. All care will be taken to prevent these side effects. If therapy is given over a long time, atrophy and wasting in the muscle injected may occur.  Occasionally the patient's become refractory to treatment because they develop antibodies to the toxin. In this event, therapy needs to be modified.  I have read the above information and consent to the administration of botulism toxin.    BOTOX PROCEDURE NOTE FOR MIGRAINE HEADACHE    Contraindications and precautions discussed with patient(above). Aseptic procedure was observed and patient tolerated procedure. Procedure performed by Dr. Georgia Dom  The condition has  existed for more than 6 months, and pt does not have a diagnosis of ALS, Myasthenia Gravis or Lambert-Eaton Syndrome.  Risks and benefits of injections discussed and pt agrees to proceed with the procedure.  Written consent obtained  These injections are medically necessary. Pt  receives good benefits from these injections. These injections do not cause sedations or hallucinations which the oral therapies may cause.  Description of procedure:  The patient was placed in a sitting position. The standard protocol was used for Botox as follows, with 5 units of Botox injected at each site:   -Procerus muscle, midline injection  -Corrugator muscle, bilateral injection  -Frontalis muscle, bilateral injection, with 2 sites each side, medial injection was performed in the upper one third of the frontalis muscle, in the region vertical from the medial inferior edge of the superior orbital rim. The lateral injection was again in the upper one third of the forehead vertically above the lateral limbus of the cornea, 1.5 cm lateral to the medial injection site.  -Temporalis muscle injection, 4 sites, bilaterally. The first injection was 3 cm above the tragus of the ear, second injection site was 1.5 cm to 3 cm up from the first injection site in line with the tragus of the ear. The third injection site was 1.5-3 cm forward between the first 2 injection sites. The fourth injection site was 1.5 cm posterior to the second injection site.   -Occipitalis muscle injection, 3 sites, bilaterally. The first injection was done one half way between the occipital protuberance and the tip of the mastoid process behind the ear. The second injection site was done lateral and superior to the first, 1 fingerbreadth from the first injection. The third injection site was 1 fingerbreadth superiorly and medially from the first injection site.  -Cervical paraspinal muscle injection, 2 sites, bilateral knee first injection site was 1  cm from the midline of the cervical spine, 3 cm inferior to the lower border of the occipital protuberance. The second injection site was 1.5 cm superiorly and laterally to the first injection site.  -Trapezius muscle injection was performed at 3 sites, bilaterally. The first injection site was in the upper trapezius muscle halfway between the inflection point of the neck, and the acromion. The second injection site was one half way between the acromion and the first injection site. The third injection was done between the first injection site and the inflection point of the neck.   Will return for repeat injection in 3 months.   155 units of Botox was used, 45U Botox not injected was wasted. The patient tolerated the procedure well, there were no complications of the above procedure.

## 2021-11-27 ENCOUNTER — Ambulatory Visit: Payer: BC Managed Care – PPO | Admitting: Family Medicine

## 2021-11-27 ENCOUNTER — Encounter: Payer: Self-pay | Admitting: Family Medicine

## 2021-11-27 NOTE — Progress Notes (Deleted)
   Established Patient Office Visit  Subjective   Patient ID: Lacey Jensen, female    DOB: [birth year]  Age: 56 y.o. MRN: [id number]  No chief complaint on file.   HPI  ADD - Reports symptoms are well controlled on current regime. Denies any problems with insomnia, chest pain, palpitations, or SOB.     {History (Optional):23778}  ROS    Objective:     LMP 11/23/2015  {Vitals History (Optional):23777}  Physical Exam Vitals and nursing note reviewed.  Constitutional:      Appearance: She is well-developed.  HENT:     Head: Normocephalic and atraumatic.  Cardiovascular:     Rate and Rhythm: Normal rate and regular rhythm.     Heart sounds: Normal heart sounds.  Pulmonary:     Effort: Pulmonary effort is normal.     Breath sounds: Normal breath sounds.  Skin:    General: Skin is warm and dry.  Neurological:     Mental Status: She is alert and oriented to person, place, and time.  Psychiatric:        Behavior: Behavior normal.    No results found for any visits on 11/27/21.  {Labs (Optional):23779}  The ASCVD Risk score (Arnett DK, et al., 2019) failed to calculate for the following reasons:   Cannot find a previous HDL lab   Cannot find a previous total cholesterol lab    Assessment & Plan:   Problem List Items Addressed This Visit       Other   Attention deficit hyperactivity disorder (ADHD) - Primary    No follow-ups on file.    Beatrice Lecher, MD

## 2021-12-05 ENCOUNTER — Telehealth: Payer: Self-pay | Admitting: Family Medicine

## 2021-12-05 DIAGNOSIS — F902 Attention-deficit hyperactivity disorder, combined type: Secondary | ICD-10-CM

## 2021-12-05 MED ORDER — METHYLPHENIDATE HCL ER (OSM) 27 MG PO TBCR: 27.0000 mg | EXTENDED_RELEASE_TABLET | ORAL | 0 refills | Status: DC

## 2021-12-05 NOTE — Telephone Encounter (Signed)
Patient is scheduled for an appt on 12/4. But she is out of her med.   I am currently out of my ADHD medication (I have 4 days left).  Could she refill through then.  Can a refill be sent until her appt?

## 2021-12-05 NOTE — Telephone Encounter (Signed)
Meds ordered this encounter  Medications   methylphenidate (CONCERTA) 27 MG PO CR tablet    Sig: Take 1 tablet (27 mg total) by mouth every morning.    Dispense:  30 tablet    Refill:  0

## 2021-12-06 NOTE — Telephone Encounter (Signed)
My chart message sent informing pt

## 2021-12-18 ENCOUNTER — Encounter: Payer: Self-pay | Admitting: Neurology

## 2021-12-19 ENCOUNTER — Telehealth: Payer: Self-pay | Admitting: *Deleted

## 2021-12-19 MED ORDER — NURTEC 75 MG PO TBDP: 75.0000 mg | ORAL_TABLET | Freq: Every day | ORAL | 3 refills | Status: DC | PRN

## 2021-12-19 NOTE — Telephone Encounter (Signed)
Completed Nurtec PA on Cover My Meds. Key: [id number]. Awaiting determination from [facility].

## 2021-12-19 NOTE — Telephone Encounter (Signed)
Approved today Effective from 12/19/2021 through 03/12/2022. initial, acute

## 2021-12-25 ENCOUNTER — Ambulatory Visit: Payer: BC Managed Care – PPO | Admitting: Family Medicine

## 2021-12-25 ENCOUNTER — Encounter: Payer: Self-pay | Admitting: Family Medicine

## 2021-12-25 VITALS — BP 135/83 | HR 78 | Ht 69.0 in | Wt 150.0 lb

## 2021-12-25 DIAGNOSIS — R195 Other fecal abnormalities: Secondary | ICD-10-CM | POA: Diagnosis not present

## 2021-12-25 DIAGNOSIS — R634 Abnormal weight loss: Secondary | ICD-10-CM | POA: Diagnosis not present

## 2021-12-25 DIAGNOSIS — F902 Attention-deficit hyperactivity disorder, combined type: Secondary | ICD-10-CM

## 2021-12-25 MED ORDER — METHYLPHENIDATE HCL ER (OSM) 27 MG PO TBCR: 27.0000 mg | EXTENDED_RELEASE_TABLET | ORAL | 0 refills | Status: DC

## 2021-12-25 NOTE — Assessment & Plan Note (Signed)
Well controlled. Continue current regimen. Follow up in  6 mo. RF sent for 6 months.

## 2021-12-25 NOTE — Progress Notes (Addendum)
   Established Patient Office Visit  Subjective   Patient ID: Lacey Jensen, female    DOB: [birth year]  Age: 56 y.o. MRN: [id number]  Chief Complaint  Patient presents with   Medication Refill    HPI  ADHD - Reports symptoms are well controlled on current regime. Denies any problems with insomnia, chest pain, palpitations, or SOB.    Abnormal stools x 6 months.  . Lost 15 lbs in the last few months.  They have some form but says they are slimy and have some fibrous tissues to them. She also gets some stomach discomfort with eating so hasn't been eating as much.  Had tried to cut back on gluten.  Colonoscopy is up-to-date.  2021.    ROS    Objective:     BP 135/83 (BP Location: Left Arm, Patient Position: Sitting, Cuff Size: Normal)   Pulse 78   Ht '5\' 9"'$  (1.753 m)   Wt 150 lb 0.6 oz (68.1 kg)   LMP 11/23/2015   SpO2 95%   BMI 22.16 kg/m    Physical Exam Vitals and nursing note reviewed.  Constitutional:      Appearance: She is well-developed.  HENT:     Head: Normocephalic and atraumatic.  Cardiovascular:     Rate and Rhythm: Normal rate and regular rhythm.     Heart sounds: Normal heart sounds.  Pulmonary:     Effort: Pulmonary effort is normal.     Breath sounds: Normal breath sounds.  Skin:    General: Skin is warm and dry.  Neurological:     Mental Status: She is alert and oriented to person, place, and time.  Psychiatric:        Behavior: Behavior normal.      No results found for any visits on 12/25/21.    The ASCVD Risk score (Arnett DK, et al., 2019) failed to calculate for the following reasons:   Cannot find a previous HDL lab   Cannot find a previous total cholesterol lab    Assessment & Plan:   Problem List Items Addressed This Visit       Other   Attention deficit hyperactivity disorder (ADHD) - Primary    Well controlled. Continue current regimen. Follow up in  6 mo. RF sent for 6 months.        Relevant Medications    methylphenidate (CONCERTA) 27 MG PO CR tablet (Start on 01/03/2022)   methylphenidate (CONCERTA) 27 MG PO CR tablet (Start on 02/02/2022)   methylphenidate (CONCERTA) 27 MG PO CR tablet   Other Visit Diagnoses     Loose stools       Relevant Orders   Food Specific IgG Allergy( Adult)   Celiac Disease Panel   Ambulatory referral to Gastroenterology   Change in stool       Relevant Orders   Ambulatory referral to Gastroenterology   Abnormal weight loss           Loose stools  - change in stools x 6 months. Will refre back to GI. Test for food sensitivities and celiac.    Return in about 6 months (around 06/26/2022) for ADD.    Beatrice Lecher, MD

## 2021-12-26 ENCOUNTER — Telehealth: Payer: Self-pay | Admitting: Neurology

## 2021-12-26 NOTE — Telephone Encounter (Signed)
LVM and sent mychart msg informing pt of appointment change from 02/20/22 to 02/28/22 - MD out

## 2021-12-28 LAB — CELIAC DISEASE PANEL
(tTG) Ab, IgA: <1 U/mL
(tTG) Ab, IgG: <1 U/mL
Gliadin IgA: <1 U/mL
Gliadin IgG: <1 U/mL
Immunoglobulin A: 126 mg/dL (ref 47–310)

## 2021-12-28 LAB — FOOD SPECIFIC IGG ALLERGY( ADULT)
COFFEE (F221) IGG: <2 ug/mL (ref <2.0)
Cacao (Chocolate)IgG: <2 ug/mL (ref <2.0)
Casein IgE: 10.5 ug/mL — ABNORMAL HIGH (ref <2.0)
Codfish/Scrod IgG: <2 ug/mL (ref <2.0)
Corn IgG: 3.3 ug/mL — ABNORMAL HIGH (ref <2.0)
Egg white, IgG: 2.8 ug/mL — ABNORMAL HIGH (ref <2.0)
PEANUT (F13) IGG: <2 ug/mL (ref <2.0)
Soybean IgG: <2 ug/mL (ref <2.0)
Tomato IgG: <2 ug/mL (ref <2.0)
Wheat IgG: 2.5 ug/mL — ABNORMAL HIGH (ref <2.0)
YEAST (F45) IGG: <2 ug/mL (ref <2.0)

## 2021-12-29 NOTE — Progress Notes (Signed)
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  This is found in a lot of dairy products like yogurt and cheese and milk.  So I would work on significantly reducing those products in your diet to see if you notice a difference.  You just had a little borderline elevation to corn, egg white and wheat.  Being that the case and was the highest I would put most of your focus on changing your diet in that regard and then you could always consider adjusting your intake of the corn wheat and egg white if you are still noticing some symptoms.  Negative for celiac.

## 2022-01-02 ENCOUNTER — Ambulatory Visit: Payer: BC Managed Care – PPO | Admitting: Family Medicine

## 2022-01-02 ENCOUNTER — Encounter: Payer: Self-pay | Admitting: Family Medicine

## 2022-01-02 VITALS — BP 105/56 | HR 84 | Ht 69.0 in | Wt 151.1 lb

## 2022-01-02 DIAGNOSIS — R051 Acute cough: Secondary | ICD-10-CM

## 2022-01-02 DIAGNOSIS — J101 Influenza due to other identified influenza virus with other respiratory manifestations: Secondary | ICD-10-CM

## 2022-01-02 LAB — POCT INFLUENZA A/B
Influenza A, POC: POSITIVE — AB
Influenza B, POC: NEGATIVE

## 2022-01-02 LAB — POC COVID19 BINAXNOW: SARS Coronavirus 2 Ag: NEGATIVE

## 2022-01-02 MED ORDER — HYDROCODONE BIT-HOMATROP MBR 5-1.5 MG/5ML PO SOLN: 5.0000 mL | Freq: Every evening | ORAL | 0 refills | Status: DC | PRN

## 2022-01-02 NOTE — Progress Notes (Signed)
Acute Office Visit  Subjective:     Patient ID: Lacey Jensen, female    DOB: [birth year], 56 y.o.   MRN: [id number]  Chief Complaint  Patient presents with   Cough    headaches    HPI Patient is in today for HA, cough, and sinuses congestion.  Fever on Sat to 102.3 and fever on Sunday and yesterday.  Taking Nyquail.  No sore throat.  Ears are okay.  Last fever was yesterday.  Is been really exhausted.  + sick coworkers at work.    ROS      Objective:    BP (!) 105/56 (BP Location: Left Arm, Patient Position: Sitting, Cuff Size: Normal)   Pulse 84   Ht '5\' 9"'$  (1.753 m)   Wt 151 lb 1.9 oz (68.5 kg)   LMP 11/23/2015   SpO2 96%   BMI 22.32 kg/m     Physical Exam Constitutional:      Appearance: She is well-developed.  HENT:     Head: Normocephalic and atraumatic.     Right Ear: Tympanic membrane, ear canal and external ear normal.     Left Ear: Tympanic membrane, ear canal and external ear normal.     Nose: Nose normal.     Mouth/Throat:     Pharynx: Oropharynx is clear.  Eyes:     Conjunctiva/sclera: Conjunctivae normal.     Pupils: Pupils are equal, round, and reactive to light.  Neck:     Thyroid: No thyromegaly.  Cardiovascular:     Rate and Rhythm: Normal rate and regular rhythm.     Heart sounds: Normal heart sounds.  Pulmonary:     Effort: Pulmonary effort is normal.     Breath sounds: Normal breath sounds. No wheezing.  Musculoskeletal:     Cervical back: Neck supple.  Lymphadenopathy:     Cervical: No cervical adenopathy.  Skin:    General: Skin is warm and dry.  Neurological:     Mental Status: She is alert and oriented to person, place, and time.     Results for orders placed or performed in visit on 01/02/22  POC COVID-19  Result Value Ref Range   SARS Coronavirus 2 Ag Negative Negative  POCT Influenza A/B  Result Value Ref Range   Influenza A, POC Positive (A) Negative   Influenza B, POC Negative Negative        Assessment &  Plan:   Problem List Items Addressed This Visit   None Visit Diagnoses     Acute cough    -  Primary   Relevant Medications   HYDROcodone bit-homatropine (HYCODAN) 5-1.5 MG/5ML syrup   Other Relevant Orders   POC COVID-19 (Completed)   POCT Influenza A/B (Completed)   Influenza B          Influenza B-she is out of the window for Paxlovid so I think at this point she is can to be symptomatic care but that way she can also share with coworkers.  Prescription cough medication sent for use at bedtime.  Stay hydrated.  Also recommend nasal saline to keep sinuses irrigated.  Okay to continue use of over-the-counter cough and cold medication as well.  Meds ordered this encounter  Medications   HYDROcodone bit-homatropine (HYCODAN) 5-1.5 MG/5ML syrup    Sig: Take 5-10 mLs by mouth at bedtime as needed for cough.    Dispense:  75 mL    Refill:  0    Return if symptoms worsen or fail  to improve.  Beatrice Lecher, MD

## 2022-01-12 ENCOUNTER — Telehealth: Payer: Self-pay

## 2022-01-12 DIAGNOSIS — R051 Acute cough: Secondary | ICD-10-CM

## 2022-01-12 MED ORDER — HYDROCODONE BIT-HOMATROP MBR 5-1.5 MG/5ML PO SOLN: 5.0000 mL | Freq: Every evening | ORAL | 0 refills | Status: DC | PRN

## 2022-01-12 MED ORDER — PREDNISONE 20 MG PO TABS: 40.0000 mg | ORAL_TABLET | Freq: Every day | ORAL | 0 refills | Status: DC

## 2022-01-12 NOTE — Telephone Encounter (Signed)
Left msg that med was sent [facility] and she would need to follow up if no better after holiday.

## 2022-01-12 NOTE — Telephone Encounter (Signed)
Meds ordered this encounter  Medications   HYDROcodone bit-homatropine (HYCODAN) 5-1.5 MG/5ML syrup    Sig: Take 5-10 mLs by mouth at bedtime as needed for cough.    Dispense:  75 mL    Refill:  0   predniSONE (DELTASONE) 20 MG tablet    Sig: Take 2 tablets (40 mg total) by mouth daily with breakfast.    Dispense:  10 tablet    Refill:  0   Not better after the holidays will need to make an appointment.

## 2022-01-12 NOTE — Telephone Encounter (Signed)
Pt left msg that she has finished the cough meds and she still has a cough and not feeling any better. Please advise.

## 2022-02-07 ENCOUNTER — Telehealth: Payer: Self-pay | Admitting: Family Medicine

## 2022-02-07 NOTE — Telephone Encounter (Signed)
Please call pt and let her know due for pap smear.  Has been over 5 years.

## 2022-02-08 NOTE — Telephone Encounter (Signed)
LVM advising pt to call Dr. Roseanne Kaufman office to schedule an appointment to get this taken care of.

## 2022-02-13 DIAGNOSIS — G43709 Chronic migraine without aura, not intractable, without status migrainosus: Secondary | ICD-10-CM | POA: Diagnosis not present

## 2022-02-19 ENCOUNTER — Other Ambulatory Visit: Payer: Self-pay | Admitting: Family Medicine

## 2022-02-19 DIAGNOSIS — F419 Anxiety disorder, unspecified: Secondary | ICD-10-CM

## 2022-02-20 ENCOUNTER — Ambulatory Visit: Payer: BC Managed Care – PPO | Admitting: Neurology

## 2022-02-28 ENCOUNTER — Other Ambulatory Visit: Payer: Self-pay | Admitting: *Deleted

## 2022-02-28 ENCOUNTER — Ambulatory Visit (INDEPENDENT_AMBULATORY_CARE_PROVIDER_SITE_OTHER): Payer: BC Managed Care – PPO | Admitting: Neurology

## 2022-02-28 DIAGNOSIS — F902 Attention-deficit hyperactivity disorder, combined type: Secondary | ICD-10-CM

## 2022-02-28 DIAGNOSIS — G43711 Chronic migraine without aura, intractable, with status migrainosus: Secondary | ICD-10-CM | POA: Diagnosis not present

## 2022-02-28 MED ORDER — ONABOTULINUMTOXINA 100 UNITS IJ SOLR
155.0000 [IU] | Freq: Once | INTRAMUSCULAR | Status: AC
  Administered 2022-02-28: 155 [IU] via INTRAMUSCULAR

## 2022-02-28 NOTE — Progress Notes (Signed)
Botox- 100 units x 2 vials Lot: [id number] Expiration: 03/2024 NDC: 5583-1674-25  Bacteriostatic 0.9% Sodium Chloride- 57m total Lot: [id number]: 11/25 NDC: 634758-307-46 Dx: [id number]/P

## 2022-02-28 NOTE — Progress Notes (Signed)
Consent Form Botulism Toxin Injection For Chronic Migraine  02/28/2022: stable  11/23/2021: stable doing great. Allean Found, nurtec,zavzpret samples to try. Since being on botox >>50% improvement in migraines only has 4 migraine days a month and < 10 total headache days a month she has tried imitrex,maxalt,ubrelvy,nurtec.  No orders of the defined types were placed in this encounter.   09/20/2021: doing well, stable 07/19/2021: stable. 03/22/2021: stable Interval history 12/08/2020: Stable, > 80% improvement migraine and headache freq and severity. +a. 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   Reviewed orally with patient, additionally signature is on file:  Consent Form Botulism Toxin Injection For Chronic Migraine    Reviewed orally with patient, additionally signature is on file:  Botulism toxin has been approved by the Federal drug administration for treatment of chronic migraine. Botulism toxin does not cure chronic migraine and it may not be effective in some patients.  The administration of botulism toxin is accomplished by injecting a small amount of toxin into the muscles of the neck and head. Dosage must be titrated for each individual. Any benefits resulting from botulism toxin tend to wear off after 3 months with a repeat injection required if benefit is to be maintained. Injections are usually done every 3-4 months with maximum effect peak achieved by about 2 or 3 weeks. Botulism toxin is expensive and you should be sure of what costs you will incur resulting from the injection.  The side effects of botulism toxin use for chronic migraine may include:   -Transient, and usually mild, facial weakness with facial injections  -Transient, and usually mild, head or neck weakness with head/neck injections  -Reduction or loss of forehead facial animation due to forehead muscle weakness  -Eyelid drooping  -Dry eye  -Pain at the  site of injection or bruising at the site of injection  -Double vision  -Potential unknown long term risks  Contraindications: You should not have Botox if you are pregnant, nursing, allergic to albumin, have an infection, skin condition, or muscle weakness at the site of the injection, or have myasthenia gravis, Lambert-Eaton syndrome, or ALS.  It is also possible that as with any injection, there may be an allergic reaction or no effect from the medication. Reduced effectiveness after repeated injections is sometimes seen and rarely infection at the injection site may occur. All care will be taken to prevent these side effects. If therapy is given over a long time, atrophy and wasting in the muscle injected may occur. Occasionally the patient's become refractory to treatment because they develop antibodies to the toxin. In this event, therapy needs to be modified.  I have read the above information and consent to the administration of botulism toxin.    BOTOX PROCEDURE NOTE FOR MIGRAINE HEADACHE    Contraindications and precautions discussed with patient(above). Aseptic procedure was observed and patient tolerated procedure. Procedure performed by Dr. Georgia Dom  The condition has existed for more than 6 months, and pt does not have a diagnosis of ALS, Myasthenia Gravis or Lambert-Eaton Syndrome.  Risks and benefits of injections discussed and pt agrees to proceed with the procedure.  Written consent obtained  These injections are medically necessary. Pt  receives good benefits from these injections. These injections do not cause sedations or hallucinations which the oral therapies may cause.  Description of procedure:  The patient was placed in a sitting position. The standard protocol was used  for Botox as follows, with 5 units of Botox injected at each site:   -Procerus muscle, midline injection  -Corrugator muscle, bilateral injection  -Frontalis muscle, bilateral injection, with  2 sites each side, medial injection was performed in the upper one third of the frontalis muscle, in the region vertical from the medial inferior edge of the superior orbital rim. The lateral injection was again in the upper one third of the forehead vertically above the lateral limbus of the cornea, 1.5 cm lateral to the medial injection site.  -Temporalis muscle injection, 4 sites, bilaterally. The first injection was 3 cm above the tragus of the ear, second injection site was 1.5 cm to 3 cm up from the first injection site in line with the tragus of the ear. The third injection site was 1.5-3 cm forward between the first 2 injection sites. The fourth injection site was 1.5 cm posterior to the second injection site.   -Occipitalis muscle injection, 3 sites, bilaterally. The first injection was done one half way between the occipital protuberance and the tip of the mastoid process behind the ear. The second injection site was done lateral and superior to the first, 1 fingerbreadth from the first injection. The third injection site was 1 fingerbreadth superiorly and medially from the first injection site.  -Cervical paraspinal muscle injection, 2 sites, bilateral knee first injection site was 1 cm from the midline of the cervical spine, 3 cm inferior to the lower border of the occipital protuberance. The second injection site was 1.5 cm superiorly and laterally to the first injection site.  -Trapezius muscle injection was performed at 3 sites, bilaterally. The first injection site was in the upper trapezius muscle halfway between the inflection point of the neck, and the acromion. The second injection site was one half way between the acromion and the first injection site. The third injection was done between the first injection site and the inflection point of the neck.   Will return for repeat injection in 3 months.   155 units of Botox was used, 45U Botox not injected was wasted. The patient  tolerated the procedure well, there were no complications of the above procedure.

## 2022-03-01 MED ORDER — METHYLPHENIDATE HCL ER (OSM) 27 MG PO TBCR: 27.0000 mg | EXTENDED_RELEASE_TABLET | ORAL | 0 refills | Status: DC

## 2022-03-08 ENCOUNTER — Encounter: Payer: Self-pay | Admitting: Family Medicine

## 2022-03-08 ENCOUNTER — Telehealth (INDEPENDENT_AMBULATORY_CARE_PROVIDER_SITE_OTHER): Payer: BC Managed Care – PPO | Admitting: Family Medicine

## 2022-03-08 DIAGNOSIS — J0100 Acute maxillary sinusitis, unspecified: Secondary | ICD-10-CM

## 2022-03-08 MED ORDER — AMOXICILLIN 875 MG PO TABS: 875.0000 mg | ORAL_TABLET | Freq: Two times a day (BID) | ORAL | 0 refills | Status: AC

## 2022-03-08 NOTE — Progress Notes (Signed)
    Virtual Visit via Video Note  I connected with Lacey Jensen on 03/08/22 at  1:00 PM EST by a video enabled telemedicine application and verified that I am speaking with the correct person using two identifiers.   I discussed the limitations of evaluation and management by telemedicine and the availability of in person appointments. The patient expressed understanding and agreed to proceed.  Patient location: at home Provider location: in office  Subjective:    CC:   Chief Complaint  Patient presents with   Cough    HPI: Spoke w/pt she reports that she continues to have sinus issues x 1 week with cough. Maybe low grade fever 5 days ago. She has mucus that's green and some blood mixed in.    She denies any f/s/c/n/v. No ear pain/pressure, sore throat. Using nasal saline.    She does reports facial pressure and headaches. She was taking Mucinex last week she hasn't taken anything this week. She also had some cough syrup that was given previously.    She hasn't taken a Covid test.    Past medical history, Surgical history, Family history not pertinant except as noted below, Social history, Allergies, and medications have been entered into the medical record, reviewed, and corrections made.    Objective:    General: Speaking clearly in complete sentences without any shortness of breath.  Alert and oriented x3.  Normal judgment. No apparent acute distress.    Impression and Recommendations:    Problem List Items Addressed This Visit   None Visit Diagnoses     Acute non-recurrent maxillary sinusitis    -  Primary   Relevant Medications   amoxicillin (AMOXIL) 875 MG tablet      Acute sinusitis with symptoms x 1 week she feels like it is getting worse.  We did discuss the possibility of waiting a day or 2 just to see if she improves before starting the antibiotic.  But if she is feeling worse okay to go ahead and start the amoxicillin.  Continue with nasal saline.   Follow-up in 1 week if not improving.  No orders of the defined types were placed in this encounter.   Meds ordered this encounter  Medications   amoxicillin (AMOXIL) 875 MG tablet    Sig: Take 1 tablet (875 mg total) by mouth 2 (two) times daily for 10 days.    Dispense:  14 tablet    Refill:  0     I discussed the assessment and treatment plan with the patient. The patient was provided an opportunity to ask questions and all were answered. The patient agreed with the plan and demonstrated an understanding of the instructions.   The patient was advised to call back or seek an in-person evaluation if the symptoms worsen or if the condition fails to improve as anticipated.   Beatrice Lecher, MD

## 2022-03-08 NOTE — Progress Notes (Signed)
Spoke w/pt she reports that she continues to have sinus issues with cough. She has mucus that's green and some blood mixed in.   She denies any f/s/c/n/v. No ear pain/pressure, sore throat.   She does reports facial pressure and headaches. She was taking Mucinex last week she hasn't taken anything this week. She also had some cough syrup that was given previously.   She hasn't taken a Covid test.

## 2022-04-02 ENCOUNTER — Encounter: Payer: BC Managed Care – PPO | Admitting: Obstetrics & Gynecology

## 2022-04-24 ENCOUNTER — Other Ambulatory Visit: Payer: Self-pay | Admitting: Neurology

## 2022-04-24 NOTE — Telephone Encounter (Signed)
Requested Prescriptions   Pending Prescriptions Disp Refills   LORazepam (ATIVAN) 0.5 MG tablet [Pharmacy Med Name: LORAZEPAM 0.5MG  TABLETS] 20 tablet     Sig: TAKE 1 TABLET(0.5 MG) BY MOUTH EVERY 8 HOURS   Pt last seen by Dr. Jaynee Eagles on 02/28/22, upcoming appt on 05/29/22 Routing to provider to fill   Previous Dispenses   Dispensed Days Supply Quantity Provider Pharmacy  LORAZEPAM 0.5MG  TABLETS 12/25/2021 6 20 each Melvenia Beam, MD [place] #...  LORAZEPAM 0.5MG  TABLETS 10/12/2021 6 20 each Melvenia Beam, MD [place] #...  LORAZEPAM 0.5MG  TABLETS 10/01/2021 6 20 each Melvenia Beam, MD [place] #...  LORAZEPAM 0.5MG  TABLETS 08/13/2021 6 20 each Melvenia Beam, MD [place] #.Marland KitchenMarland Kitchen

## 2022-05-08 DIAGNOSIS — B80 Enterobiasis: Secondary | ICD-10-CM | POA: Diagnosis not present

## 2022-05-10 ENCOUNTER — Telehealth: Payer: Self-pay | Admitting: Neurology

## 2022-05-10 NOTE — Telephone Encounter (Signed)
Received fax from [facility] that they need new Botox auth before pt's 05/29/22 appt.

## 2022-05-15 ENCOUNTER — Other Ambulatory Visit (HOSPITAL_COMMUNITY): Payer: Self-pay

## 2022-05-15 NOTE — Telephone Encounter (Signed)
   Benefit Verification [id number] Submitted!

## 2022-05-15 NOTE — Telephone Encounter (Signed)
Monice callled from [facility]. Stated pt need PA for Botox sent to [facility] [phone]. Stated [facility] name need to be listed so they can ship botox order.

## 2022-05-18 ENCOUNTER — Other Ambulatory Visit (HOSPITAL_COMMUNITY): Payer: Self-pay

## 2022-05-18 NOTE — Telephone Encounter (Signed)
Pharmacy Patient Advocate Encounter   Received notification from [facility] that prior authorization for Botox 100UNIT solution (x 2 vials) is required/requested.   PA submitted on 05/18/2022 to (ins) [employer] [place] via [employer] or [facility]) confirmation # [id number] Status is pending

## 2022-05-22 ENCOUNTER — Other Ambulatory Visit (HOSPITAL_COMMUNITY): Payer: Self-pay

## 2022-05-25 ENCOUNTER — Other Ambulatory Visit (HOSPITAL_COMMUNITY): Payer: Self-pay

## 2022-05-28 ENCOUNTER — Other Ambulatory Visit (HOSPITAL_COMMUNITY): Payer: Self-pay

## 2022-05-28 NOTE — Telephone Encounter (Signed)
Have you gotten any updates on auth?

## 2022-05-28 NOTE — Telephone Encounter (Signed)
Received an approval via [employer] for botox however it was for Buy and Annette Stable -submitted a new PA faxed form request with updated [facility] as S/P-urgent request-will update once we receive a response.

## 2022-05-28 NOTE — Telephone Encounter (Signed)
   Approval for Specialty Pharmacy!!

## 2022-05-28 NOTE — Telephone Encounter (Signed)
Sent update to pt. 

## 2022-05-28 NOTE — Telephone Encounter (Signed)
Called [facility] and provided them with approval information. Rep stated she needed to run a test claim and once that's approved Botox will be ready to schedule for delivery. Will reach back out tomorrow to check in.

## 2022-05-29 ENCOUNTER — Ambulatory Visit: Payer: BC Managed Care – PPO | Admitting: Neurology

## 2022-05-29 NOTE — Telephone Encounter (Signed)
We had to cancel 5/7 Botox due to an issue with [facility] not being on the auth (they can't send Botox without being on there). 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 Would she be ok to come late one of those days or should I look further out for another day?

## 2022-05-29 NOTE — Telephone Encounter (Signed)
Yes give her one of those appt but let her know she can come last up to [phone] thanks!

## 2022-05-30 ENCOUNTER — Telehealth: Payer: Self-pay | Admitting: Family Medicine

## 2022-05-30 DIAGNOSIS — Z1231 Encounter for screening mammogram for malignant neoplasm of breast: Secondary | ICD-10-CM

## 2022-05-30 DIAGNOSIS — R195 Other fecal abnormalities: Secondary | ICD-10-CM

## 2022-05-30 DIAGNOSIS — G43709 Chronic migraine without aura, not intractable, without status migrainosus: Secondary | ICD-10-CM | POA: Diagnosis not present

## 2022-05-30 DIAGNOSIS — R1084 Generalized abdominal pain: Secondary | ICD-10-CM

## 2022-05-30 NOTE — Telephone Encounter (Signed)
Due for mammo

## 2022-06-04 ENCOUNTER — Ambulatory Visit (INDEPENDENT_AMBULATORY_CARE_PROVIDER_SITE_OTHER): Payer: BC Managed Care – PPO | Admitting: Neurology

## 2022-06-04 DIAGNOSIS — G43711 Chronic migraine without aura, intractable, with status migrainosus: Secondary | ICD-10-CM

## 2022-06-04 MED ORDER — ONABOTULINUMTOXINA 100 UNITS IJ SOLR
155.0000 [IU] | Freq: Once | INTRAMUSCULAR | Status: AC
  Administered 2022-06-04: 155 [IU] via INTRAMUSCULAR

## 2022-06-04 NOTE — Addendum Note (Signed)
Addended by: Nani Gasser D on: 06/04/2022 04:35 PM   Modules accepted: Orders

## 2022-06-04 NOTE — Progress Notes (Signed)
Consent Form Botulism Toxin Injection For Chronic Migraine  02/28/2022: stable  11/23/2021: stable doing great. Heide Spark, nurtec,zavzpret samples to try. Since being on botox >>50% improvement in migraines only has 4 migraine days a month and < 10 total headache days a month she has tried imitrex,maxalt,ubrelvy,nurtec.  Meds ordered this encounter  Medications   botulinum toxin Type A (BOTOX) injection 155 Units    Botox- 200 units x 1 vial Lot: [id number] Expiration: 04/2024 NDC: 4540-9811-91  Bacteriostatic 0.9% Sodium Chloride-  4mL  Lot: [id number] Expiration: 04/23/2023 NDC: 6213-0865-78  Dx: G43.711 S/P  Witnessed by Delmer Islam    09/20/2021: doing well, stable 07/19/2021: stable. 03/22/2021: stable Interval history 12/08/2020: Stable, > 80% improvement migraine and headache freq and severity. +a. 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   Reviewed orally with patient, additionally signature is on file:  Consent Form Botulism Toxin Injection For Chronic Migraine  06-04-2022: > 70% improved in migraine and headache freq and severity doin gfantastic.   Reviewed orally with patient, additionally signature is on file:  Botulism toxin has been approved by the Federal drug administration for treatment of chronic migraine. Botulism toxin does not cure chronic migraine and it may not be effective in some patients.  The administration of botulism toxin is accomplished by injecting a small amount of toxin into the muscles of the neck and head. Dosage must be titrated for each individual. Any benefits resulting from botulism toxin tend to wear off after 3 months with a repeat injection required if benefit is to be maintained. Injections are usually done every 3-4 months with maximum effect peak achieved by about 2 or 3 weeks. Botulism toxin is expensive and you should be sure of what costs you will incur resulting from the injection.  The  side effects of botulism toxin use for chronic migraine may include:   -Transient, and usually mild, facial weakness with facial injections  -Transient, and usually mild, head or neck weakness with head/neck injections  -Reduction or loss of forehead facial animation due to forehead muscle weakness  -Eyelid drooping  -Dry eye  -Pain at the site of injection or bruising at the site of injection  -Double vision  -Potential unknown long term risks  Contraindications: You should not have Botox if you are pregnant, nursing, allergic to albumin, have an infection, skin condition, or muscle weakness at the site of the injection, or have myasthenia gravis, Lambert-Eaton syndrome, or ALS.  It is also possible that as with any injection, there may be an allergic reaction or no effect from the medication. Reduced effectiveness after repeated injections is sometimes seen and rarely infection at the injection site may occur. All care will be taken to prevent these side effects. If therapy is given over a long time, atrophy and wasting in the muscle injected may occur. Occasionally the patient's become refractory to treatment because they develop antibodies to the toxin. In this event, therapy needs to be modified.  I have read the above information and consent to the administration of botulism toxin.    BOTOX PROCEDURE NOTE FOR MIGRAINE HEADACHE    Contraindications and precautions discussed with patient(above). Aseptic procedure was observed and patient tolerated procedure. Procedure performed by Dr. Artemio Aly  The condition has existed for more than 6 months, and pt does not have a diagnosis of ALS, Myasthenia Gravis or Lambert-Eaton Syndrome.  Risks and benefits of injections discussed and pt  agrees to proceed with the procedure.  Written consent obtained  These injections are medically necessary. Pt  receives good benefits from these injections. These injections do not cause sedations or  hallucinations which the oral therapies may cause.  Description of procedure:  The patient was placed in a sitting position. The standard protocol was used for Botox as follows, with 5 units of Botox injected at each site:   -Procerus muscle, midline injection  -Corrugator muscle, bilateral injection  -Frontalis muscle, bilateral injection, with 2 sites each side, medial injection was performed in the upper one third of the frontalis muscle, in the region vertical from the medial inferior edge of the superior orbital rim. The lateral injection was again in the upper one third of the forehead vertically above the lateral limbus of the cornea, 1.5 cm lateral to the medial injection site.  -Temporalis muscle injection, 4 sites, bilaterally. The first injection was 3 cm above the tragus of the ear, second injection site was 1.5 cm to 3 cm up from the first injection site in line with the tragus of the ear. The third injection site was 1.5-3 cm forward between the first 2 injection sites. The fourth injection site was 1.5 cm posterior to the second injection site.   -Occipitalis muscle injection, 3 sites, bilaterally. The first injection was done one half way between the occipital protuberance and the tip of the mastoid process behind the ear. The second injection site was done lateral and superior to the first, 1 fingerbreadth from the first injection. The third injection site was 1 fingerbreadth superiorly and medially from the first injection site.  -Cervical paraspinal muscle injection, 2 sites, bilateral knee first injection site was 1 cm from the midline of the cervical spine, 3 cm inferior to the lower border of the occipital protuberance. The second injection site was 1.5 cm superiorly and laterally to the first injection site.  -Trapezius muscle injection was performed at 3 sites, bilaterally. The first injection site was in the upper trapezius muscle halfway between the inflection point of  the neck, and the acromion. The second injection site was one half way between the acromion and the first injection site. The third injection was done between the first injection site and the inflection point of the neck.   Will return for repeat injection in 3 months.   155 units of Botox was used, 45U Botox not injected was wasted. The patient tolerated the procedure well, there were no complications of the above procedure.

## 2022-06-04 NOTE — Progress Notes (Signed)
Botox- 200 units x 1 vial Lot: [id number] Expiration: 04/2024 NDC: 4540-9811-91  Bacteriostatic 0.9% Sodium Chloride-  4mL  Lot: [id number] Expiration: 04/23/2023 NDC: 6213-0865-78  Dx: I69.629 S/P

## 2022-06-05 NOTE — Addendum Note (Signed)
Addended by: Chalmers Cater on: 06/05/2022 08:55 AM   Modules accepted: Orders

## 2022-06-05 NOTE — Addendum Note (Signed)
Addended by: Chalmers Cater on: 06/05/2022 08:52 AM   Modules accepted: Orders

## 2022-06-05 NOTE — Addendum Note (Signed)
Addended by: Nani Gasser D on: 06/05/2022 12:45 PM   Modules accepted: Orders

## 2022-06-05 NOTE — Telephone Encounter (Signed)
Orders Placed This Encounter  Procedures   MM 3D SCREENING MAMMOGRAM BILATERAL BREAST    Standing Status:   Future    Standing Expiration Date:   06/04/2023    Order Specific Question:   Reason for Exam (SYMPTOM  OR DIAGNOSIS REQUIRED)    Answer:   breast cancer screening    Order Specific Question:   Is the patient pregnant?    Answer:   No    Order Specific Question:   Preferred imaging location?    Answer:   [facility]   Ambulatory referral to Gastroenterology    Referral Priority:   Routine    Referral Type:   Consultation    Referral Reason:   Specialty Services Required    Referred to Provider:   Lynann Bologna, MD    Number of Visits Requested:   1

## 2022-06-12 ENCOUNTER — Other Ambulatory Visit: Payer: Self-pay | Admitting: Family Medicine

## 2022-06-12 DIAGNOSIS — F902 Attention-deficit hyperactivity disorder, combined type: Secondary | ICD-10-CM

## 2022-06-13 MED ORDER — METHYLPHENIDATE HCL ER (OSM) 27 MG PO TBCR: 27.0000 mg | EXTENDED_RELEASE_TABLET | ORAL | 0 refills | Status: DC

## 2022-06-25 ENCOUNTER — Other Ambulatory Visit: Payer: Self-pay | Admitting: Family Medicine

## 2022-06-25 DIAGNOSIS — Z1231 Encounter for screening mammogram for malignant neoplasm of breast: Secondary | ICD-10-CM

## 2022-06-26 ENCOUNTER — Ambulatory Visit: Payer: BC Managed Care – PPO | Admitting: Family Medicine

## 2022-06-27 ENCOUNTER — Ambulatory Visit: Payer: BC Managed Care – PPO | Admitting: Family Medicine

## 2022-06-28 DIAGNOSIS — Z1231 Encounter for screening mammogram for malignant neoplasm of breast: Secondary | ICD-10-CM

## 2022-06-29 NOTE — Progress Notes (Unsigned)
Last Mammogram: scheduled for 07/10/22 Last Pap Smear:  08/20/16- negative Last Colon Screening;  7 yrs ago Seat Belts:   yes Sun Screen:   yes Dental Check Up:  yes Brush & Floss:  yes

## 2022-07-02 ENCOUNTER — Encounter: Payer: Self-pay | Admitting: Obstetrics & Gynecology

## 2022-07-02 ENCOUNTER — Ambulatory Visit (INDEPENDENT_AMBULATORY_CARE_PROVIDER_SITE_OTHER): Payer: BC Managed Care – PPO | Admitting: Obstetrics & Gynecology

## 2022-07-02 ENCOUNTER — Other Ambulatory Visit (HOSPITAL_COMMUNITY)
Admission: RE | Admit: 2022-07-02 | Discharge: 2022-07-02 | Disposition: A | Discharge status: Home / Self Care | Payer: BC Managed Care – PPO | Source: Ambulatory Visit | Attending: Obstetrics & Gynecology | Admitting: Obstetrics & Gynecology

## 2022-07-02 VITALS — BP 134/76 | HR 91 | Resp 16 | Ht 69.0 in | Wt 149.0 lb

## 2022-07-02 DIAGNOSIS — M816 Localized osteoporosis [Lequesne]: Secondary | ICD-10-CM

## 2022-07-02 DIAGNOSIS — Z01419 Encounter for gynecological examination (general) (routine) without abnormal findings: Secondary | ICD-10-CM | POA: Insufficient documentation

## 2022-07-02 DIAGNOSIS — N952 Postmenopausal atrophic vaginitis: Secondary | ICD-10-CM

## 2022-07-02 NOTE — Progress Notes (Signed)
Subjective:     Lacey Jensen is a 57 y.o. female here for a routine exam.  Current complaints: none.  No PMB.  Seeing GI for hemorrhoids.     Gynecologic History Patient's last menstrual period was 11/23/2015. Contraception: post menopausal status Last Mammogram: scheduled for 07/10/22 Last Pap Smear:  08/20/16- negative Last Colon Screening;  7 yrs ago Seat Belts:   yes Sun Screen:   yes Dental Check Up:  yes Brush & Floss:  yes     Obstetric History OB History  Gravida Para Term Preterm AB Living  2 2       2   SAB IAB Ectopic Multiple Live Births               # Outcome Date GA Lbr Len/2nd Weight Sex Delivery Anes PTL Lv  2 Para      Vag-Spont     1 Para      Vag-Spont        The following portions of the patient's history were reviewed and updated as appropriate: allergies, current medications, past family history, past medical history, past social history, past surgical history, and problem list.  Review of Systems Pertinent items noted in HPI and remainder of comprehensive ROS otherwise negative.    Objective:     Vitals:   07/02/22 1305  BP: 134/76  Pulse: 91  Resp: 16  Weight: 149 lb (67.6 kg)  Height: 5\' 9"  (1.753 m)   Vitals:  WNL General appearance: alert, cooperative and no distress  HEENT: Normocephalic, without obvious abnormality, atraumatic Eyes: negative Throat: lips, mucosa, and tongue normal; teeth and gums normal  Respiratory: Clear to auscultation bilaterally  CV: Regular rate and rhythm  Breasts:  Normal appearance, no masses or tenderness, no nipple retraction or dimpling  GI: Soft, non-tender; bowel sounds normal; no masses,  no organomegaly  GU: External Genitalia:  Tanner V, no lesion Urethra:  No prolapse   Vagina: Atropic, friable with speculum insertion and pap smear, no discharge  Cervix: No CMT, no lesion  Uterus:  Normal size and contour, non tender  Adnexa: Normal, no masses, non tender  Musculoskeletal: No edema, redness or  tenderness in the calves or thighs  Skin: No lesions or rash  Lymphatic: Axillary adenopathy: none     Psychiatric: Normal mood and behavior        Assessment:    Healthy female exam.    Plan:   Pap with cotesting Yearly mammogram Colonoscopy in 3 years Vaginal dryness--try Replens; if not better discussed vaginal estrogen.  Osteoporosis--repeat Dexa to evaluate for meds--continue to maximize intake of calcium and vitamin D

## 2022-07-06 LAB — CYTOLOGY - PAP
Comment: NEGATIVE
Diagnosis: NEGATIVE
High risk HPV: NEGATIVE

## 2022-07-10 ENCOUNTER — Ambulatory Visit (INDEPENDENT_AMBULATORY_CARE_PROVIDER_SITE_OTHER): Payer: BC Managed Care – PPO

## 2022-07-10 DIAGNOSIS — Z1231 Encounter for screening mammogram for malignant neoplasm of breast: Secondary | ICD-10-CM | POA: Diagnosis not present

## 2022-07-13 NOTE — Progress Notes (Signed)
Normal mammogram. Follow up in 1 year.

## 2022-08-01 ENCOUNTER — Other Ambulatory Visit: Payer: BC Managed Care – PPO

## 2022-08-06 ENCOUNTER — Ambulatory Visit: Payer: BC Managed Care – PPO | Admitting: Family Medicine

## 2022-08-15 ENCOUNTER — Ambulatory Visit (INDEPENDENT_AMBULATORY_CARE_PROVIDER_SITE_OTHER): Payer: BC Managed Care – PPO

## 2022-08-15 DIAGNOSIS — Z780 Asymptomatic menopausal state: Secondary | ICD-10-CM

## 2022-08-15 DIAGNOSIS — M810 Age-related osteoporosis without current pathological fracture: Secondary | ICD-10-CM | POA: Diagnosis not present

## 2022-08-15 DIAGNOSIS — M816 Localized osteoporosis [Lequesne]: Secondary | ICD-10-CM

## 2022-08-16 ENCOUNTER — Encounter: Payer: Self-pay | Admitting: Family Medicine

## 2022-08-16 ENCOUNTER — Ambulatory Visit: Payer: BC Managed Care – PPO | Admitting: Family Medicine

## 2022-08-16 VITALS — BP 112/73 | HR 95 | Resp 16 | Ht 69.0 in | Wt 153.0 lb

## 2022-08-16 DIAGNOSIS — Z79899 Other long term (current) drug therapy: Secondary | ICD-10-CM | POA: Diagnosis not present

## 2022-08-16 DIAGNOSIS — G43711 Chronic migraine without aura, intractable, with status migrainosus: Secondary | ICD-10-CM

## 2022-08-16 DIAGNOSIS — Z13220 Encounter for screening for lipoid disorders: Secondary | ICD-10-CM

## 2022-08-16 DIAGNOSIS — F902 Attention-deficit hyperactivity disorder, combined type: Secondary | ICD-10-CM

## 2022-08-16 DIAGNOSIS — M810 Age-related osteoporosis without current pathological fracture: Secondary | ICD-10-CM | POA: Diagnosis not present

## 2022-08-16 MED ORDER — METHYLPHENIDATE HCL ER (OSM) 27 MG PO TBCR: 27.0000 mg | EXTENDED_RELEASE_TABLET | ORAL | 0 refills | Status: DC

## 2022-08-16 MED ORDER — METHYLPHENIDATE HCL ER (OSM) 27 MG PO TBCR
27.0000 mg | EXTENDED_RELEASE_TABLET | ORAL | 0 refills | Status: DC
Start: 2022-08-16 — End: 2022-09-06

## 2022-08-16 NOTE — Progress Notes (Addendum)
Established Patient Office Visit  Subjective   Patient ID: Lacey Jensen, female    DOB: 12/07/1965  Age: 57 y.o. MRN: [id number]  Chief Complaint  Patient presents with   check ADD    HPI  ADD - Reports symptoms are well controlled on current regime. Denies any problems with insomnia, chest pain, palpitations, or SOB.    Due for lipid screening.  Last lipid panel was 9 years ago she is now over 50.  Follows with Dr. Daisy Blossom, neurology for migraine headaches. She has done really well with botox injections. Has provided great relief.    Also wanted to discuss her recent DEXA results.  T-score of -3.5 which was a significant decrease from prior study in 2021.  Used to take a calcium and vitamin D but has not in a while.  She plans on ramping up her exercise routine.    ROS    Objective:     BP 112/73 (BP Location: Left Arm, Patient Position: Sitting, Cuff Size: Normal)   Pulse 95   Resp 16   Ht 5\' 9"  (1.753 m)   Wt 153 lb 0.6 oz (69.4 kg)   LMP 11/23/2015   SpO2 96%   BMI 22.60 kg/m    Physical Exam Vitals and nursing note reviewed.  Constitutional:      Appearance: She is well-developed.  HENT:     Head: Normocephalic and atraumatic.  Cardiovascular:     Rate and Rhythm: Normal rate and regular rhythm.     Heart sounds: Normal heart sounds.  Pulmonary:     Effort: Pulmonary effort is normal.     Breath sounds: Normal breath sounds.  Skin:    General: Skin is warm and dry.  Neurological:     Mental Status: She is alert and oriented to person, place, and time.  Psychiatric:        Behavior: Behavior normal.      Results for orders placed or performed in visit on 08/16/22  Lipid Panel With LDL/HDL Ratio  Result Value Ref Range   Cholesterol, Total 257 (H) 100 - 199 mg/dL   Triglycerides 63 0 - 149 mg/dL   HDL 85 >14 mg/dL   VLDL Cholesterol Cal 10 5 - 40 mg/dL   LDL Chol Calc (NIH) 782 (H) 0 - 99 mg/dL   LDL/HDL Ratio 1.9 0.0 - 3.2 ratio  TSH   Result Value Ref Range   TSH 1.850 0.450 - 4.500 uIU/mL  CMP14+EGFR  Result Value Ref Range   Glucose 90 70 - 99 mg/dL   BUN 11 6 - 24 mg/dL   Creatinine, Ser 9.56 0.57 - 1.00 mg/dL   eGFR 213 >08 MV/HQI/6.96   BUN/Creatinine Ratio 16 9 - 23   Sodium 140 134 - 144 mmol/L   Potassium 4.3 3.5 - 5.2 mmol/L   Chloride 102 96 - 106 mmol/L   CO2 24 20 - 29 mmol/L   Calcium 9.7 8.7 - 10.2 mg/dL   Total Protein 7.1 6.0 - 8.5 g/dL   Albumin 4.6 3.8 - 4.9 g/dL   Globulin, Total 2.5 1.5 - 4.5 g/dL   Bilirubin Total 0.3 0.0 - 1.2 mg/dL   Alkaline Phosphatase 131 (H) 44 - 121 IU/L   AST 45 (H) 0 - 40 IU/L   ALT 42 (H) 0 - 32 IU/L  VITAMIN D 25 Hydroxy (Vit-D Deficiency, Fractures)  Result Value Ref Range   Vit D, 25-Hydroxy 39.1 30.0 - 100.0 ng/mL  The 10-year ASCVD risk score (Arnett DK, et al., 2019) is: 1.6%    Assessment & Plan:   Problem List Items Addressed This Visit       Cardiovascular and Mediastinum   Chronic migraine without aura, intractable, with status migrainosus    Getting great relief with her Botox injections and just using Nurtec as needed.      Relevant Orders   Lipid Panel With LDL/HDL Ratio (Completed)   TSH (Completed)   CMP14+EGFR (Completed)     Musculoskeletal and Integument   Osteoporosis    Discussed diagnosis and reviewed current tx guidelines.   The current recommendation for osteoporosis treatment includes:   #1 calcium-total of 1200 mg of calcium daily.  If you eat a very calcium rich diet you may be able to obtain that without a supplement.  If not, then I recommend calcium 500 mg twice a day.  There are several products over-the-counter such as Caltrate D and Viactiv chews which are great options that contain calcium and vitamin D. #2 vitamin D-recommend 800 international units daily. #3 exercise-recommend 30 minutes of weightbearing exercise 3 days a week.  Resistance training ,such as doing bands and light weights, can be  particularly helpful. #4 medication-if you are not currently on a bone builder, also called a bisphosphonate, then this has been shown to be very helpful in maintaining bone strength, preventing further thinning of the bones, and reducing your risk for fractures.  I would highly recommend that you consider starting 1 of these medications.  If you are okay with that then please let us know and we will send one to your pharmacy.  If you would like to discuss further we are happy to make an appointment for you so that we can go over options for treatment.      Relevant Medications   alendronate (FOSAMAX) 70 MG tablet   Other Relevant Orders   VITAMIN D 25 Hydroxy (Vit-D Deficiency, Fractures) (Completed)     Other   Attention deficit hyperactivity disorder (ADHD) - Primary    Well controlled. Continue current regimen. Follow up in  34mo       Relevant Medications   methylphenidate (CONCERTA) 27 MG PO CR tablet (Start on 10/15/2022)   methylphenidate (CONCERTA) 27 MG PO CR tablet   methylphenidate (CONCERTA) 27 MG PO CR tablet (Start on 09/15/2022)   Other Visit Diagnoses     Screening, lipid       Relevant Orders   Lipid Panel With LDL/HDL Ratio (Completed)   TSH (Completed)   CMP14+EGFR (Completed)   Medication management       Relevant Orders   Lipid Panel With LDL/HDL Ratio (Completed)   TSH (Completed)   CMP14+EGFR (Completed)      Acted with Dr. Penne Lash her OB/GYN who actually ordered the DEXA scan.  Will move forward with addition of a bisphosphonate recommend repeat DEXA in 2 years.  Also make sure taking adequate calcium with vitamin D.  Return in about 6 months (around 02/16/2023) for ADD meds .    Nani Gasser, MD

## 2022-08-16 NOTE — Assessment & Plan Note (Signed)
Well controlled. Continue current regimen. Follow up in  6 mo  

## 2022-08-16 NOTE — Assessment & Plan Note (Signed)
Discussed diagnosis and reviewed current tx guidelines.   The current recommendation for osteoporosis treatment includes:   #1 calcium-total of 1200 mg of calcium daily.  If you eat a very calcium rich diet you may be able to obtain that without a supplement.  If not, then I recommend calcium 500 mg twice a day.  There are several products over-the-counter such as Caltrate D and Viactiv chews which are great options that contain calcium and vitamin D. #2 vitamin D-recommend 800 international units daily. #3 exercise-recommend 30 minutes of weightbearing exercise 3 days a week.  Resistance training ,such as doing bands and light weights, can be particularly helpful. #4 medication-if you are not currently on a bone builder, also called a bisphosphonate, then this has been shown to be very helpful in maintaining bone strength, preventing further thinning of the bones, and reducing your risk for fractures.  I would highly recommend that you consider starting 1 of these medications.  If you are okay with that then please let us know and we will send one to your pharmacy.  If you would like to discuss further we are happy to make an appointment for you so that we can go over options for treatment.

## 2022-08-16 NOTE — Assessment & Plan Note (Signed)
Getting great relief with her Botox injections and just using Nurtec as needed.

## 2022-08-16 NOTE — Patient Instructions (Signed)
  The current recommendation for osteoporosis treatment includes:   #1 calcium-total of 1200 mg of calcium daily.  If you eat a very calcium rich diet you may be able to obtain that without a supplement.  If not, then I recommend calcium 500 mg twice a day.  There are several products over-the-counter such as Caltrate D and Viactiv chews which are great options that contain calcium and vitamin D. #2 vitamin D-recommend 800 international units daily. #3 exercise-recommend 30 minutes of weightbearing exercise 3 days a week.  Resistance training ,such as doing bands and light weights, can be particularly helpful. #4 medication-if you are not currently on a bone builder, also called a bisphosphonate, then this has been shown to be very helpful in maintaining bone strength, preventing further thinning of the bones, and reducing your risk for fractures.  I would highly recommend that you consider starting 1 of these medications.  If you are okay with that then please let us know and we will send one to your pharmacy.  If you would like to discuss further we are happy to make an appointment for you so that we can go over options for treatment. 

## 2022-08-17 MED ORDER — ALENDRONATE SODIUM 70 MG PO TABS
70.0000 mg | ORAL_TABLET | ORAL | 4 refills | Status: DC
Start: 2022-08-17 — End: 2022-09-06

## 2022-08-17 NOTE — Progress Notes (Addendum)
Hi Lacey Jensen, LDL cholesterol is 162 which is quite high goal is less than 100.  Your good cholesterol is really good though which is very helpful and does help reduce your risk for heart disease.  Liver enzymes are mildly elevated.  Nothing in a worrisome range but we do need to follow this up and plan to recheck again in a month.  Avoid any excess alcohol or Tylenol products during that time and we can recheck.  Your thyroid level looks great.  Vitamin D looks better this time.  The 10-year ASCVD risk score (Arnett DK, et al., 2019) is: 1.6%   Values used to calculate the score:     Age: 57 years     Sex: Female     Is Non-Hispanic African American: No     Diabetic: No     Tobacco smoker: No     Systolic Blood Pressure: 112 mmHg     Is BP treated: No     HDL Cholesterol: 85 mg/dL     Total Cholesterol: 257 mg/dL

## 2022-08-17 NOTE — Addendum Note (Signed)
Addended by: Nani Gasser D on: 08/17/2022 10:57 AM   Modules accepted: Orders

## 2022-08-20 MED ORDER — BOTOX 100 UNITS IJ SOLR: INTRAMUSCULAR | 3 refills | Status: DC

## 2022-08-20 NOTE — Telephone Encounter (Signed)
Please send Botox refill to [facility] when you have a chance.

## 2022-08-20 NOTE — Telephone Encounter (Signed)
Order sent.

## 2022-08-20 NOTE — Addendum Note (Signed)
Addended by: Jacqualine Code D on: 08/20/2022 03:35 PM   Modules accepted: Orders

## 2022-08-28 ENCOUNTER — Other Ambulatory Visit: Payer: Self-pay | Admitting: Family Medicine

## 2022-08-28 DIAGNOSIS — F419 Anxiety disorder, unspecified: Secondary | ICD-10-CM

## 2022-08-28 DIAGNOSIS — G43709 Chronic migraine without aura, not intractable, without status migrainosus: Secondary | ICD-10-CM | POA: Diagnosis not present

## 2022-08-28 NOTE — Telephone Encounter (Signed)
Delivery pending pt's consent

## 2022-08-30 ENCOUNTER — Ambulatory Visit: Payer: BC Managed Care – PPO | Admitting: Neurology

## 2022-09-03 ENCOUNTER — Other Ambulatory Visit: Payer: Self-pay | Admitting: Neurology

## 2022-09-04 NOTE — Telephone Encounter (Signed)
Called pt, she accepted 8/14 @ 3pm appointment.

## 2022-09-05 ENCOUNTER — Ambulatory Visit (INDEPENDENT_AMBULATORY_CARE_PROVIDER_SITE_OTHER): Payer: BC Managed Care – PPO | Admitting: Neurology

## 2022-09-05 DIAGNOSIS — G43711 Chronic migraine without aura, intractable, with status migrainosus: Secondary | ICD-10-CM | POA: Diagnosis not present

## 2022-09-05 MED ORDER — ONABOTULINUMTOXINA 100 UNITS IJ SOLR
155.0000 [IU] | Freq: Once | INTRAMUSCULAR | Status: AC
Start: 2022-09-05 — End: 2022-09-05
  Administered 2022-09-05: 155 [IU] via INTRAMUSCULAR

## 2022-09-05 MED ORDER — TRAZODONE HCL 50 MG PO TABS
50.0000 mg | ORAL_TABLET | Freq: Every day | ORAL | 11 refills | Status: DC
Start: 2022-09-05 — End: 2023-08-15

## 2022-09-05 NOTE — Progress Notes (Unsigned)
Consent Form Botulism Toxin Injection For Chronic Migraine  02/28/2022: stable  11/23/2021: stable doing great. Heide Spark, nurtec,zavzpret samples to try. Since being on botox >>50% improvement in migraines only has 4 migraine days a month and < 10 total headache days a month she has tried imitrex,maxalt,ubrelvy,nurtec.  Meds ordered this encounter  Medications   botulinum toxin Type A (BOTOX) injection 155 Units    09/20/2021: doing well, stable 07/19/2021: stable. 03/22/2021: stable Interval history 12/08/2020: Stable, > 80% improvement migraine and headache freq and severity. +a. 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   Reviewed orally with patient, additionally signature is on file:  Consent Form Botulism Toxin Injection For Chronic Migraine  06-04-2022: > 70% improved in migraine and headache freq and severity doin gfantastic.   Reviewed orally with patient, additionally signature is on file:  Botulism toxin has been approved by the Federal drug administration for treatment of chronic migraine. Botulism toxin does not cure chronic migraine and it may not be effective in some patients.  The administration of botulism toxin is accomplished by injecting a small amount of toxin into the muscles of the neck and head. Dosage must be titrated for each individual. Any benefits resulting from botulism toxin tend to wear off after 3 months with a repeat injection required if benefit is to be maintained. Injections are usually done every 3-4 months with maximum effect peak achieved by about 2 or 3 weeks. Botulism toxin is expensive and you should be sure of what costs you will incur resulting from the injection.  The side effects of botulism toxin use for chronic migraine may include:   -Transient, and usually mild, facial weakness with facial injections  -Transient, and usually mild, head or neck weakness with head/neck  injections  -Reduction or loss of forehead facial animation due to forehead muscle weakness  -Eyelid drooping  -Dry eye  -Pain at the site of injection or bruising at the site of injection  -Double vision  -Potential unknown long term risks  Contraindications: You should not have Botox if you are pregnant, nursing, allergic to albumin, have an infection, skin condition, or muscle weakness at the site of the injection, or have myasthenia gravis, Lambert-Eaton syndrome, or ALS.  It is also possible that as with any injection, there may be an allergic reaction or no effect from the medication. Reduced effectiveness after repeated injections is sometimes seen and rarely infection at the injection site may occur. All care will be taken to prevent these side effects. If therapy is given over a long time, atrophy and wasting in the muscle injected may occur. Occasionally the patient's become refractory to treatment because they develop antibodies to the toxin. In this event, therapy needs to be modified.  I have read the above information and consent to the administration of botulism toxin.    BOTOX PROCEDURE NOTE FOR MIGRAINE HEADACHE    Contraindications and precautions discussed with patient(above). Aseptic procedure was observed and patient tolerated procedure. Procedure performed by Dr. Artemio Aly  The condition has existed for more than 6 months, and pt does not have a diagnosis of ALS, Myasthenia Gravis or Lambert-Eaton Syndrome.  Risks and benefits of injections discussed and pt agrees to proceed with the procedure.  Written consent obtained  These injections are medically necessary. Pt  receives good benefits from these injections. These injections do not cause sedations or hallucinations which the oral therapies may  cause.  Description of procedure:  The patient was placed in a sitting position. The standard protocol was used for Botox as follows, with 5 units of Botox injected at each  site:   -Procerus muscle, midline injection  -Corrugator muscle, bilateral injection  -Frontalis muscle, bilateral injection, with 2 sites each side, medial injection was performed in the upper one third of the frontalis muscle, in the region vertical from the medial inferior edge of the superior orbital rim. The lateral injection was again in the upper one third of the forehead vertically above the lateral limbus of the cornea, 1.5 cm lateral to the medial injection site.  -Temporalis muscle injection, 4 sites, bilaterally. The first injection was 3 cm above the tragus of the ear, second injection site was 1.5 cm to 3 cm up from the first injection site in line with the tragus of the ear. The third injection site was 1.5-3 cm forward between the first 2 injection sites. The fourth injection site was 1.5 cm posterior to the second injection site.   -Occipitalis muscle injection, 3 sites, bilaterally. The first injection was done one half way between the occipital protuberance and the tip of the mastoid process behind the ear. The second injection site was done lateral and superior to the first, 1 fingerbreadth from the first injection. The third injection site was 1 fingerbreadth superiorly and medially from the first injection site.  -Cervical paraspinal muscle injection, 2 sites, bilateral knee first injection site was 1 cm from the midline of the cervical spine, 3 cm inferior to the lower border of the occipital protuberance. The second injection site was 1.5 cm superiorly and laterally to the first injection site.  -Trapezius muscle injection was performed at 3 sites, bilaterally. The first injection site was in the upper trapezius muscle halfway between the inflection point of the neck, and the acromion. The second injection site was one half way between the acromion and the first injection site. The third injection was done between the first injection site and the inflection point of the  neck.   Will return for repeat injection in 3 months.   155 units of Botox was used, 45U Botox not injected was wasted. The patient tolerated the procedure well, there were no complications of the above procedure.

## 2022-09-05 NOTE — Progress Notes (Signed)
Botox- 100 units x 2 vial Lot: [id number] Expiration: 05/2024 NDC: 6440-3474-25   Bacteriostatic 0.9% Sodium Chloride- 4 mL  Lot: [id number] Expiration: 04/23/2022 NDC: 5643-3295-18   Dx: A41.660  SP Witnessed by: Alveria Apley

## 2022-09-10 ENCOUNTER — Encounter: Payer: Self-pay | Admitting: *Deleted

## 2022-09-19 ENCOUNTER — Encounter: Payer: Self-pay | Admitting: Physician Assistant

## 2022-09-19 ENCOUNTER — Telehealth: Payer: BC Managed Care – PPO | Admitting: Physician Assistant

## 2022-09-19 VITALS — Temp 98.7°F | Ht 69.0 in

## 2022-09-19 DIAGNOSIS — J0100 Acute maxillary sinusitis, unspecified: Secondary | ICD-10-CM

## 2022-09-19 MED ORDER — AMOXICILLIN-POT CLAVULANATE 875-125 MG PO TABS
1.0000 | ORAL_TABLET | Freq: Two times a day (BID) | ORAL | 0 refills | Status: DC
Start: 2022-09-19 — End: 2022-12-28

## 2022-09-19 NOTE — Progress Notes (Signed)
..  Virtual Visit via Video Note  I connected with Lacey Jensen on 09/19/22 at 10:30 AM EDT by a video enabled telemedicine application and verified that I am speaking with the correct person using two identifiers.  Location: Patient: car Provider: clinic  .Marland KitchenParticipating in visit:  Patient: Lacey Jensen Provider: Tandy Gaw PA-C   I discussed the limitations of evaluation and management by telemedicine and the availability of in person appointments. The patient expressed understanding and agreed to proceed.  History of Present Illness: Pt is a 57 yo female with hx of sinusitis who calls into the clinic with sinus pressure, cough, ears ringing, headache. Negative home covid test. Blowing out green mucus. Using mucinex D with some relief.   .. Active Ambulatory Problems    Diagnosis Date Noted   Attention deficit hyperactivity disorder (ADHD) 06/25/2008   ALLERGIC RHINITIS 04/25/2009   History of abnormal Pap smear 06/18/2012   Insomnia 12/02/2013   Anxiety 06/05/2017   Chronic migraine without aura, intractable, with status migrainosus 06/04/2018   Cervical myofascial pain syndrome 06/04/2018   Bloating 10/13/2018   Chronic constipation 10/13/2018   Hemorrhoids 07/21/2019   Atrophic vaginitis 07/02/2022   Osteoporosis 08/16/2022   Resolved Ambulatory Problems    Diagnosis Date Noted   Migraine without aura 09/08/2009   PMDD (premenstrual dysphoric disorder) 04/04/2010   EXCESSIVE MENSTRUAL BLEEDING 04/04/2010   ANKLE PAIN 10/18/2010   Toe fracture, right 10/21/2014   Muscle spasm 11/02/2016   Acute recurrent maxillary sinusitis 10/14/2017   Past Medical History:  Diagnosis Date   Abnormal Pap smear    Allergy    Dysplasia of cervix    Headache(784.0)    Immunotherapy        Observations/Objective: No acute distress Normal mood and appearance Normal breathing   Assessment and Plan: Marland KitchenMarland KitchenRegina "Lacey Jensen" was seen today for headache.  Diagnoses and all orders for this  visit:  Acute non-recurrent maxillary sinusitis -     amoxicillin-clavulanate (AUGMENTIN) 875-125 MG tablet; Take 1 tablet by mouth 2 (two) times daily.   Hx of sinusitis  Negative covid Symptoms over 1 week Sent augmentin ok to use with flonase Symptomatic care discussed  Follow up as needed or if symptoms persist   Follow Up Instructions:    I discussed the assessment and treatment plan with the patient. The patient was provided an opportunity to ask questions and all were answered. The patient agreed with the plan and demonstrated an understanding of the instructions.   The patient was advised to call back or seek an in-person evaluation if the symptoms worsen or if the condition fails to improve as anticipated.    Tandy Gaw, PA-C

## 2022-10-20 ENCOUNTER — Encounter: Payer: Self-pay | Admitting: Obstetrics & Gynecology

## 2022-11-14 DIAGNOSIS — G43709 Chronic migraine without aura, not intractable, without status migrainosus: Secondary | ICD-10-CM | POA: Diagnosis not present

## 2022-12-04 ENCOUNTER — Ambulatory Visit: Payer: BC Managed Care – PPO | Admitting: Neurology

## 2022-12-04 DIAGNOSIS — G43711 Chronic migraine without aura, intractable, with status migrainosus: Secondary | ICD-10-CM | POA: Diagnosis not present

## 2022-12-04 DIAGNOSIS — F418 Other specified anxiety disorders: Secondary | ICD-10-CM

## 2022-12-04 MED ORDER — ONABOTULINUMTOXINA 200 UNITS IJ SOLR
155.0000 [IU] | Freq: Once | INTRAMUSCULAR | Status: AC
Start: 2022-12-04 — End: 2022-12-04
  Administered 2022-12-04: 155 [IU] via INTRAMUSCULAR

## 2022-12-04 NOTE — Progress Notes (Unsigned)
Botox- 100 units x 2  vial Lot: [id number] Expiration: 11/2024 NDC: 4010-2725-36  Bacteriostatic 0.9% Sodium Chloride- 4 L  Lot: [id number] Expiration: 04/23/2023 NDC: 7425-9563-87  Dx: F64.332 S/P  Witnessed by Andrey Campanile, RN

## 2022-12-06 NOTE — Progress Notes (Signed)
Consent Form Botulism Toxin Injection For Chronic Migraine    12/06/2022: Stable. She feels clenching makes her migraines worse +5 each masseter and she has pain in the V2 region of the TGN with migraines added 5U to each side of the bridge of the nose as well.  09/05/2022: Patient is doing great, she gets greater than 50% improvement in migraine frequency, severity and duration from Botox.   02/28/2022: stable  11/23/2021: stable doing great. Heide Spark, nurtec,zavzpret samples to try. Since being on botox >>50% improvement in migraines only has 4 migraine days a month and < 10 total headache days a month she has tried imitrex,maxalt,ubrelvy,nurtec.  Meds ordered this encounter  Medications   botulinum toxin Type A (BOTOX) injection 155 Units    Botox- 100 units x 2  vial Lot: [id number] Expiration: 11/2024 NDC: 5366-4403-47  Bacteriostatic 0.9% Sodium Chloride- 4 L  Lot: [id number] Expiration: 04/23/2023 NDC: 3875-6433-29  Dx: J18.841 S/P  Witnessed by Andrey Campanile, RN    09/20/2021: doing well, stable 07/19/2021: stable. 03/22/2021: stable Interval history 12/08/2020: Stable, > 80% improvement migraine and headache freq and severity. +a. 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   Reviewed orally with patient, additionally signature is on file:  Consent Form Botulism Toxin Injection For Chronic Migraine  06-04-2022: > 70% improved in migraine and headache freq and severity doin gfantastic.   Reviewed orally with patient, additionally signature is on file:  Botulism toxin has been approved by the Federal drug administration for treatment of chronic migraine. Botulism toxin does not cure chronic migraine and it may not be effective in some patients.  The administration of botulism toxin is accomplished by injecting a small amount of toxin into the muscles of the neck and head. Dosage must be titrated for each individual. Any benefits  resulting from botulism toxin tend to wear off after 3 months with a repeat injection required if benefit is to be maintained. Injections are usually done every 3-4 months with maximum effect peak achieved by about 2 or 3 weeks. Botulism toxin is expensive and you should be sure of what costs you will incur resulting from the injection.  The side effects of botulism toxin use for chronic migraine may include:   -Transient, and usually mild, facial weakness with facial injections  -Transient, and usually mild, head or neck weakness with head/neck injections  -Reduction or loss of forehead facial animation due to forehead muscle weakness  -Eyelid drooping  -Dry eye  -Pain at the site of injection or bruising at the site of injection  -Double vision  -Potential unknown long term risks  Contraindications: You should not have Botox if you are pregnant, nursing, allergic to albumin, have an infection, skin condition, or muscle weakness at the site of the injection, or have myasthenia gravis, Lambert-Eaton syndrome, or ALS.  It is also possible that as with any injection, there may be an allergic reaction or no effect from the medication. Reduced effectiveness after repeated injections is sometimes seen and rarely infection at the injection site may occur. All care will be taken to prevent these side effects. If therapy is given over a long time, atrophy and wasting in the muscle injected may occur. Occasionally the patient's become refractory to treatment because they develop antibodies to the toxin. In this event, therapy needs to be modified.  I have read the above information and consent to the administration of botulism toxin.  BOTOX PROCEDURE NOTE FOR MIGRAINE HEADACHE    Contraindications and precautions discussed with patient(above). Aseptic procedure was observed and patient tolerated procedure. Procedure performed by Dr. Artemio Aly  The condition has existed for more than 6 months,  and pt does not have a diagnosis of ALS, Myasthenia Gravis or Lambert-Eaton Syndrome.  Risks and benefits of injections discussed and pt agrees to proceed with the procedure.  Written consent obtained  These injections are medically necessary. Pt  receives good benefits from these injections. These injections do not cause sedations or hallucinations which the oral therapies may cause.  Description of procedure:  The patient was placed in a sitting position. The standard protocol was used for Botox as follows, with 5 units of Botox injected at each site:   -Procerus muscle, midline injection  -Corrugator muscle, bilateral injection  -Frontalis muscle, bilateral injection, with 2 sites each side, medial injection was performed in the upper one third of the frontalis muscle, in the region vertical from the medial inferior edge of the superior orbital rim. The lateral injection was again in the upper one third of the forehead vertically above the lateral limbus of the cornea, 1.5 cm lateral to the medial injection site.  -Temporalis muscle injection, 4 sites, bilaterally. The first injection was 3 cm above the tragus of the ear, second injection site was 1.5 cm to 3 cm up from the first injection site in line with the tragus of the ear. The third injection site was 1.5-3 cm forward between the first 2 injection sites. The fourth injection site was 1.5 cm posterior to the second injection site.   -Occipitalis muscle injection, 3 sites, bilaterally. The first injection was done one half way between the occipital protuberance and the tip of the mastoid process behind the ear. The second injection site was done lateral and superior to the first, 1 fingerbreadth from the first injection. The third injection site was 1 fingerbreadth superiorly and medially from the first injection site.  -Cervical paraspinal muscle injection, 2 sites, bilateral knee first injection site was 1 cm from the midline of the  cervical spine, 3 cm inferior to the lower border of the occipital protuberance. The second injection site was 1.5 cm superiorly and laterally to the first injection site.  -Trapezius muscle injection was performed at 3 sites, bilaterally. The first injection site was in the upper trapezius muscle halfway between the inflection point of the neck, and the acromion. The second injection site was one half way between the acromion and the first injection site. The third injection was done between the first injection site and the inflection point of the neck.   Will return for repeat injection in 3 months.   155 units of Botox was used, 45U Botox not injected was wasted. The patient tolerated the procedure well, there were no complications of the above procedure.

## 2022-12-08 ENCOUNTER — Encounter: Payer: Self-pay | Admitting: Neurology

## 2022-12-08 MED ORDER — ALPRAZOLAM 1 MG PO TABS
0.5000 mg | ORAL_TABLET | Freq: Three times a day (TID) | ORAL | 1 refills | Status: DC | PRN
Start: 2022-12-08 — End: 2023-05-29

## 2022-12-08 NOTE — Addendum Note (Signed)
Addended by: Naomie Dean B on: 12/08/2022 05:08 PM   Modules accepted: Orders

## 2022-12-09 ENCOUNTER — Other Ambulatory Visit: Payer: Self-pay | Admitting: Family Medicine

## 2022-12-09 DIAGNOSIS — F419 Anxiety disorder, unspecified: Secondary | ICD-10-CM

## 2022-12-28 ENCOUNTER — Encounter: Payer: Self-pay | Admitting: Physician Assistant

## 2022-12-28 ENCOUNTER — Ambulatory Visit: Payer: BC Managed Care – PPO | Admitting: Physician Assistant

## 2022-12-28 VITALS — BP 134/72 | HR 97 | Temp 98.0°F | Resp 12 | Ht 69.0 in | Wt 153.2 lb

## 2022-12-28 DIAGNOSIS — J0100 Acute maxillary sinusitis, unspecified: Secondary | ICD-10-CM

## 2022-12-28 MED ORDER — AMOXICILLIN-POT CLAVULANATE 875-125 MG PO TABS: 1.0000 | ORAL_TABLET | Freq: Two times a day (BID) | ORAL | 0 refills | Status: DC

## 2022-12-28 MED ORDER — FLUTICASONE PROPIONATE 50 MCG/ACT NA SUSP
2.0000 | Freq: Every day | NASAL | 0 refills | Status: DC
Start: 2022-12-28 — End: 2023-01-02

## 2022-12-28 NOTE — Patient Instructions (Signed)

## 2022-12-28 NOTE — Progress Notes (Signed)
Acute Office Visit  Subjective:     Patient ID: Lacey Jensen, female    DOB: [birth year], 57 y.o.   MRN: [id number]  Chief Complaint  Patient presents with   Cough   Nasal Congestion   Sore Throat    Cough Associated symptoms include a fever and a sore throat.  Sore Throat  Associated symptoms include coughing.   Patient is in today for wet cough, low-grade fever, sore throat, and headache x 5-7 days. She coughs up green/yellow mucus. She had LOA but that has since resolved. She has been using Mucinex. Denies nausea.  Review of Systems  Constitutional:  Positive for fever.  HENT:  Positive for sore throat.   Respiratory:  Positive for cough and sputum production.   Gastrointestinal:  Negative for nausea.        Objective:    BP 134/72   Pulse 97   Temp 98 F (36.7 C)   Resp 12   Ht 5\' 9"  (1.753 m)   Wt 153 lb 3.2 oz (69.5 kg)   LMP 11/23/2015   SpO2 100%   BMI 22.62 kg/m  BP Readings from Last 3 Encounters:  12/28/22 134/72  08/16/22 112/73  07/02/22 134/76   Wt Readings from Last 3 Encounters:  12/28/22 153 lb 3.2 oz (69.5 kg)  08/16/22 153 lb 0.6 oz (69.4 kg)  07/02/22 149 lb (67.6 kg)      Physical Exam Constitutional:      Appearance: She is well-developed.  HENT:     Head: Normocephalic.     Right Ear: Tympanic membrane and ear canal normal. No drainage, swelling or tenderness. No middle ear effusion. Tympanic membrane is not erythematous.     Left Ear: Tympanic membrane and ear canal normal. No drainage, swelling or tenderness.  No middle ear effusion. Tympanic membrane is not erythematous.     Mouth/Throat:     Mouth: Mucous membranes are moist. No oral lesions.     Pharynx: Uvula midline. Posterior oropharyngeal erythema and uvula swelling present. No pharyngeal swelling or oropharyngeal exudate.     Tonsils: No tonsillar exudate or tonsillar abscesses. 0 on the right. 0 on the left.  Eyes:     Conjunctiva/sclera: Conjunctivae normal.   Cardiovascular:     Rate and Rhythm: Normal rate and regular rhythm.  Pulmonary:     Effort: Pulmonary effort is normal.     Breath sounds: Normal breath sounds. No wheezing or rhonchi.  Musculoskeletal:     Cervical back: Normal range of motion and neck supple.  Lymphadenopathy:     Cervical: Cervical adenopathy present.  Neurological:     General: No focal deficit present.     Mental Status: She is alert and oriented to person, place, and time.  Psychiatric:        Mood and Affect: Mood normal.          Assessment & Plan:  Marland KitchenMarland KitchenRegina "Almira Coaster" was seen today for cough, nasal congestion and sore throat.  Diagnoses and all orders for this visit:  Acute non-recurrent maxillary sinusitis -     amoxicillin-clavulanate (AUGMENTIN) 875-125 MG tablet; Take 1 tablet by mouth 2 (two) times daily. -     fluticasone (FLONASE) 50 MCG/ACT nasal spray; Place 2 sprays into both nostrils daily.   1 week of symptoms and not improving Lungs sound great Sent augmentin and flonase for sinus infection HO given  Follow up as needed if symptoms persist or worsen   Tandy Gaw, PA-C

## 2023-01-02 ENCOUNTER — Other Ambulatory Visit: Payer: Self-pay | Admitting: *Deleted

## 2023-01-02 DIAGNOSIS — J0100 Acute maxillary sinusitis, unspecified: Secondary | ICD-10-CM

## 2023-01-02 MED ORDER — FLUTICASONE PROPIONATE 50 MCG/ACT NA SUSP: 2.0000 | Freq: Every day | NASAL | 0 refills | Status: DC

## 2023-01-04 ENCOUNTER — Other Ambulatory Visit: Payer: Self-pay

## 2023-01-04 DIAGNOSIS — F902 Attention-deficit hyperactivity disorder, combined type: Secondary | ICD-10-CM

## 2023-01-04 MED ORDER — METHYLPHENIDATE HCL ER (OSM) 27 MG PO TBCR: 27.0000 mg | EXTENDED_RELEASE_TABLET | ORAL | 0 refills | Status: DC

## 2023-01-04 NOTE — Telephone Encounter (Signed)
Patient requesting concerta refill . Last written 09/15/22 Last OV 12/28/2022 Upcoming appt 01/10/2023

## 2023-01-04 NOTE — Telephone Encounter (Signed)
Copied from CRM [id number]. Topic: Clinical - Medication Refill >> Jan 03, 2023  3:24 PM Adelina Mings wrote: Most Recent Primary Care Visit:  Provider: Jomarie Longs  Department: [facility]  Visit Type: ACUTE  Date: 12/28/2022  Medication: methylphenidate (CONCERTA) 27 MG PO CR tablet  Has the patient contacted their pharmacy? Yes (Agent: If no, request that the patient contact the pharmacy for the refill. If patient does not wish to contact the pharmacy document the reason why and proceed with request.) (Agent: If yes, when and what did the pharmacy advise?)Contact doctor   Is this the correct pharmacy for this prescription? No If no, delete pharmacy and type the correct one.  This is the patient's preferred pharmacy:  [id number] - [place], [place] - [address] AT [facility] [address] [place] [address] [place] [address] Phone: [phone] Fax: [phone]  [facility] [phone]) Specialty - [facility] [place], [place] - [address] [place] [address] Phone: [phone] Fax: [phone]  [facility] - [place], [place] - [address] [facility] [address] [place] [address] Phone: [phone] Fax: [phone]   Has the prescription been filled recently? No  Is the patient out of the medication? No  Has the patient been seen for an appointment in the last year OR does the patient have an upcoming appointment? Yes  Can we respond through MyChart? No  Agent: Please be advised that Rx refills may take up to 3 business days. We ask that you follow-up with your pharmacy.

## 2023-01-10 ENCOUNTER — Telehealth: Payer: BC Managed Care – PPO | Admitting: Family Medicine

## 2023-01-10 VITALS — Temp 99.1°F | Ht 69.0 in | Wt 152.0 lb

## 2023-01-10 DIAGNOSIS — F902 Attention-deficit hyperactivity disorder, combined type: Secondary | ICD-10-CM | POA: Diagnosis not present

## 2023-01-10 DIAGNOSIS — M810 Age-related osteoporosis without current pathological fracture: Secondary | ICD-10-CM

## 2023-01-10 MED ORDER — METHYLPHENIDATE HCL ER (OSM) 27 MG PO TBCR: 27.0000 mg | EXTENDED_RELEASE_TABLET | ORAL | 0 refills | Status: DC

## 2023-01-10 NOTE — Progress Notes (Signed)
    Virtual Visit via Video Note  I connected with Lacey Jensen on 01/10/23 at  9:50 AM EST by a video enabled telemedicine application and verified that I am speaking with the correct person using two identifiers.   I discussed the limitations of evaluation and management by telemedicine and the availability of in person appointments. The patient expressed understanding and agreed to proceed.  Patient location: at home Provider location: in office  Subjective:    CC:  No chief complaint on file.   HPI:  ADD - Reports symptoms are well controlled on current regime. Denies any problems with insomnia, chest pain, palpitations, or SOB.  She feels like her dose is effective.  She takes around 6:30 in the morning she is not having any sleep disruption at night.  Follow-up osteoporosis-she has not yet started the bisphosphonate but plans on starting it on Sundays Jensen she can get the timing right.  She says going to try to take it before she goes to church.  She has been trying to do some yoga which has been really helpful for her back but not has not necessarily incorporated specific resistance training.   Past medical history, Surgical history, Family history not pertinant except as noted below, Social history, Allergies, and medications have been entered into the medical record, reviewed, and corrections made.    Objective:    General: Speaking clearly in complete sentences without any shortness of breath.  Alert and oriented x3.  Normal judgment. No apparent acute distress.    Impression and Recommendations:    Problem List Items Addressed This Visit       Musculoskeletal and Integument   Osteoporosis - Primary   Started doing some yoga for exercise just encouraged her to keep working at it.  She is planning on starting the Fosamax soon.        Other   Attention deficit hyperactivity disorder (ADHD)   Well controlled. Continue current regimen. Follow up in  33mo        Relevant Medications   methylphenidate (CONCERTA) 27 MG PO CR tablet (Start on 03/11/2023)   methylphenidate (CONCERTA) 27 MG PO CR tablet (Start on 02/09/2023)   methylphenidate (CONCERTA) 27 MG PO CR tablet    No orders of the defined types were placed in this encounter.   Meds ordered this encounter  Medications   methylphenidate (CONCERTA) 27 MG PO CR tablet    Sig: Take 1 tablet (27 mg total) by mouth every morning.    Dispense:  30 tablet    Refill:  0   methylphenidate (CONCERTA) 27 MG PO CR tablet    Sig: Take 1 tablet (27 mg total) by mouth every morning.    Dispense:  30 tablet    Refill:  0   methylphenidate (CONCERTA) 27 MG PO CR tablet    Sig: Take 1 tablet (27 mg total) by mouth every morning.    Dispense:  30 tablet    Refill:  0     I discussed the assessment and treatment plan with the patient. The patient was provided an opportunity to ask questions and all were answered. The patient agreed with the plan and demonstrated an understanding of the instructions.   The patient was advised to call back or seek an in-person evaluation if the symptoms worsen or if the condition fails to improve as anticipated.   Nani Gasser, MD

## 2023-01-10 NOTE — Progress Notes (Signed)
Pt doing well on current regimen 

## 2023-01-10 NOTE — Assessment & Plan Note (Signed)
Well controlled. Continue current regimen. Follow up in  6 mo  

## 2023-01-10 NOTE — Assessment & Plan Note (Signed)
Started doing some yoga for exercise just encouraged her to keep working at it.  She is planning on starting the Fosamax soon.

## 2023-02-18 ENCOUNTER — Ambulatory Visit: Payer: BC Managed Care – PPO | Admitting: Family Medicine

## 2023-02-25 DIAGNOSIS — G43709 Chronic migraine without aura, not intractable, without status migrainosus: Secondary | ICD-10-CM | POA: Diagnosis not present

## 2023-02-27 ENCOUNTER — Ambulatory Visit: Payer: BC Managed Care – PPO | Admitting: Neurology

## 2023-03-05 ENCOUNTER — Ambulatory Visit: Payer: BC Managed Care – PPO | Admitting: Neurology

## 2023-03-05 ENCOUNTER — Encounter: Payer: Self-pay | Admitting: Neurology

## 2023-03-05 DIAGNOSIS — G43711 Chronic migraine without aura, intractable, with status migrainosus: Secondary | ICD-10-CM | POA: Diagnosis not present

## 2023-03-05 MED ORDER — ONABOTULINUMTOXINA 100 UNITS IJ SOLR
155.0000 [IU] | Freq: Once | INTRAMUSCULAR | Status: AC
  Administered 2023-03-05: 155 [IU] via INTRAMUSCULAR

## 2023-03-05 NOTE — Progress Notes (Signed)
Botox- 100 units x 2 vials Lot: [id number] Expiration: 05/2025 NDC: 6644-0347-42  Bacteriostatic 0.9% Sodium Chloride- 2 mL  Lot: [id number] Expiration: 11/23/2023 NDC: 7564-3329-51  Dx: 43.709 S/P Witnessed by Lennie Muckle, RN

## 2023-03-10 NOTE — Progress Notes (Signed)
Consent Form Botulism Toxin Injection For Chronic Migraine   03/10/2023: stable. > 70% improvement in freq and seveiry of migraines 12/06/2022: Stable. She feels clenching makes her migraines worse +5 each masseter and she has pain in the V2 region of the TGN with migraines added 5U to each side of the bridge of the nose as well.  09/05/2022: Patient is doing great, she gets greater than 50% improvement in migraine frequency, severity and duration from Botox.   02/28/2022: stable  11/23/2021: stable doing great. Lacey Jensen, nurtec,zavzpret samples to try. Since being on botox >>50% improvement in migraines only has 4 migraine days a month and < 10 total headache days a month she has tried imitrex,maxalt,ubrelvy,nurtec.  Meds ordered this encounter  Medications   botulinum toxin Type A (BOTOX) injection 155 Units    09/20/2021: doing well, stable 07/19/2021: stable. 03/22/2021: stable Interval history 12/08/2020: Stable, > 80% improvement migraine and headache freq and severity. +a. 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   Reviewed orally with patient, additionally signature is on file:  Consent Form Botulism Toxin Injection For Chronic Migraine  06-04-2022: > 70% improved in migraine and headache freq and severity doin gfantastic.   Reviewed orally with patient, additionally signature is on file:  Botulism toxin has been approved by the Federal drug administration for treatment of chronic migraine. Botulism toxin does not cure chronic migraine and it may not be effective in some patients.  The administration of botulism toxin is accomplished by injecting a small amount of toxin into the muscles of the neck and head. Dosage must be titrated for each individual. Any benefits resulting from botulism toxin tend to wear off after 3 months with a repeat injection required if benefit is to be maintained. Injections are usually done every 3-4  months with maximum effect peak achieved by about 2 or 3 weeks. Botulism toxin is expensive and you should be sure of what costs you will incur resulting from the injection.  The side effects of botulism toxin use for chronic migraine may include:   -Transient, and usually mild, facial weakness with facial injections  -Transient, and usually mild, head or neck weakness with head/neck injections  -Reduction or loss of forehead facial animation due to forehead muscle weakness  -Eyelid drooping  -Dry eye  -Pain at the site of injection or bruising at the site of injection  -Double vision  -Potential unknown long term risks  Contraindications: You should not have Botox if you are pregnant, nursing, allergic to albumin, have an infection, skin condition, or muscle weakness at the site of the injection, or have myasthenia gravis, Lambert-Eaton syndrome, or ALS.  It is also possible that as with any injection, there may be an allergic reaction or no effect from the medication. Reduced effectiveness after repeated injections is sometimes seen and rarely infection at the injection site may occur. All care will be taken to prevent these side effects. If therapy is given over a long time, atrophy and wasting in the muscle injected may occur. Occasionally the patient's become refractory to treatment because they develop antibodies to the toxin. In this event, therapy needs to be modified.  I have read the above information and consent to the administration of botulism toxin.    BOTOX PROCEDURE NOTE FOR MIGRAINE HEADACHE    Contraindications and precautions discussed with patient(above). Aseptic procedure was observed and patient tolerated procedure. Procedure performed by Dr.  Artemio Aly  The condition has existed for more than 6 months, and pt does not have a diagnosis of ALS, Myasthenia Gravis or Lambert-Eaton Syndrome.  Risks and benefits of injections discussed and pt agrees to proceed with the  procedure.  Written consent obtained  These injections are medically necessary. Pt  receives good benefits from these injections. These injections do not cause sedations or hallucinations which the oral therapies may cause.  Description of procedure:  The patient was placed in a sitting position. The standard protocol was used for Botox as follows, with 5 units of Botox injected at each site:   -Procerus muscle, midline injection  -Corrugator muscle, bilateral injection  -Frontalis muscle, bilateral injection, with 2 sites each side, medial injection was performed in the upper one third of the frontalis muscle, in the region vertical from the medial inferior edge of the superior orbital rim. The lateral injection was again in the upper one third of the forehead vertically above the lateral limbus of the cornea, 1.5 cm lateral to the medial injection site.  -Temporalis muscle injection, 4 sites, bilaterally. The first injection was 3 cm above the tragus of the ear, second injection site was 1.5 cm to 3 cm up from the first injection site in line with the tragus of the ear. The third injection site was 1.5-3 cm forward between the first 2 injection sites. The fourth injection site was 1.5 cm posterior to the second injection site.   -Occipitalis muscle injection, 3 sites, bilaterally. The first injection was done one half way between the occipital protuberance and the tip of the mastoid process behind the ear. The second injection site was done lateral and superior to the first, 1 fingerbreadth from the first injection. The third injection site was 1 fingerbreadth superiorly and medially from the first injection site.  -Cervical paraspinal muscle injection, 2 sites, bilateral knee first injection site was 1 cm from the midline of the cervical spine, 3 cm inferior to the lower border of the occipital protuberance. The second injection site was 1.5 cm superiorly and laterally to the first injection  site.  -Trapezius muscle injection was performed at 3 sites, bilaterally. The first injection site was in the upper trapezius muscle halfway between the inflection point of the neck, and the acromion. The second injection site was one half way between the acromion and the first injection site. The third injection was done between the first injection site and the inflection point of the neck.   Will return for repeat injection in 3 months.   155 units of Botox was used, 45U Botox not injected was wasted. The patient tolerated the procedure well, there were no complications of the above procedure.

## 2023-04-02 ENCOUNTER — Encounter: Payer: Self-pay | Admitting: Physician Assistant

## 2023-04-02 ENCOUNTER — Telehealth (INDEPENDENT_AMBULATORY_CARE_PROVIDER_SITE_OTHER): Admitting: Physician Assistant

## 2023-04-02 DIAGNOSIS — J0100 Acute maxillary sinusitis, unspecified: Secondary | ICD-10-CM | POA: Diagnosis not present

## 2023-04-02 MED ORDER — AMOXICILLIN-POT CLAVULANATE 875-125 MG PO TABS
1.0000 | ORAL_TABLET | Freq: Two times a day (BID) | ORAL | 0 refills | Status: DC
Start: 2023-04-02 — End: 2023-08-12

## 2023-04-02 NOTE — Progress Notes (Signed)
..  Virtual Visit via Video Note  I connected with Lacey Jensen on 04/02/23 at  2:40 PM EDT by a video enabled telemedicine application and verified that I am speaking with the correct person using two identifiers.  Location: Patient: home Provider: clinic  .Marland KitchenParticipating in visit:  Patient: Lacey Jensen Provider: Tandy Gaw PA-C Provider in training: Ilean China PA-S   I discussed the limitations of evaluation and management by telemedicine and the availability of in person appointments. The patient expressed understanding and agreed to proceed.  History of Present Illness: Pt has had 2 weeks of URI symptoms that continue to worsen. Pt has been treating with OTC tylenol cold severe and nyquil. She has hx of sinus infections. Denies any fever, chills, SOB. She has lots of sinus pressure and congestion.   .. Active Ambulatory Problems    Diagnosis Date Noted   Attention deficit hyperactivity disorder (ADHD) 06/25/2008   ALLERGIC RHINITIS 04/25/2009   History of abnormal Pap smear 06/18/2012   Insomnia 12/02/2013   Anxiety 06/05/2017   Chronic migraine without aura, intractable, with status migrainosus 06/04/2018   Cervical myofascial pain syndrome 06/04/2018   Bloating 10/13/2018   Chronic constipation 10/13/2018   Hemorrhoids 07/21/2019   Atrophic vaginitis 07/02/2022   Osteoporosis 08/16/2022   Resolved Ambulatory Problems    Diagnosis Date Noted   Migraine without aura 09/08/2009   PMDD (premenstrual dysphoric disorder) 04/04/2010   EXCESSIVE MENSTRUAL BLEEDING 04/04/2010   ANKLE PAIN 10/18/2010   Toe fracture, right 10/21/2014   Muscle spasm 11/02/2016   Acute recurrent maxillary sinusitis 10/14/2017   Past Medical History:  Diagnosis Date   Abnormal Pap smear    Allergy    Dysplasia of cervix    Headache(784.0)    Immunotherapy        Observations/Objective: No acute distress Normal mood and appearance congested   Assessment and Plan: Marland KitchenMarland KitchenRegina "Almira Coaster"  was seen today for sinusitis.  Diagnoses and all orders for this visit:  Acute non-recurrent maxillary sinusitis -     amoxicillin-clavulanate (AUGMENTIN) 875-125 MG tablet; Take 1 tablet by mouth 2 (two) times daily. For 10 days.  2 weeks of URI symptoms Start augmentin for 10 days with flonase Continue other symptomatic care Follow up as needed or if symptoms persist.  Follow Up Instructions:    I discussed the assessment and treatment plan with the patient. The patient was provided an opportunity to ask questions and all were answered. The patient agreed with the plan and demonstrated an understanding of the instructions.   The patient was advised to call back or seek an in-person evaluation if the symptoms worsen or if the condition fails to improve as anticipated.     Tandy Gaw, PA-C

## 2023-04-03 ENCOUNTER — Other Ambulatory Visit: Payer: Self-pay | Admitting: Family Medicine

## 2023-04-03 DIAGNOSIS — F419 Anxiety disorder, unspecified: Secondary | ICD-10-CM

## 2023-04-03 DIAGNOSIS — F902 Attention-deficit hyperactivity disorder, combined type: Secondary | ICD-10-CM

## 2023-04-05 MED ORDER — METHYLPHENIDATE HCL ER (OSM) 27 MG PO TBCR: 27.0000 mg | EXTENDED_RELEASE_TABLET | ORAL | 0 refills | Status: AC

## 2023-04-05 MED ORDER — FLUOXETINE HCL 20 MG PO CAPS: 20.0000 mg | ORAL_CAPSULE | Freq: Every day | ORAL | 0 refills | Status: DC

## 2023-04-05 NOTE — Telephone Encounter (Signed)
 Forwarding message to [facility], covering Dr. Linford Arnold  Requesting rx rf of fluoxetine 20mg   Last written 12/10/2022 Methylpenidate 27mg   Last written 03/11/2023 Last OV 01/10/2023 video visit No upcoming appt schld.

## 2023-04-22 ENCOUNTER — Telehealth: Payer: Self-pay | Admitting: Neurology

## 2023-04-22 NOTE — Telephone Encounter (Signed)
 Faxed [employer] renewal form and notes to [phone].

## 2023-04-22 NOTE — Telephone Encounter (Signed)
 Received fax of approval, pt will continue to fill through [facility].  Auth#: [id number] (04/22/23-03/23/24)

## 2023-05-16 ENCOUNTER — Other Ambulatory Visit: Payer: Self-pay | Admitting: Neurology

## 2023-05-21 DIAGNOSIS — G43709 Chronic migraine without aura, not intractable, without status migrainosus: Secondary | ICD-10-CM | POA: Diagnosis not present

## 2023-05-29 ENCOUNTER — Ambulatory Visit: Payer: BC Managed Care – PPO | Admitting: Neurology

## 2023-05-29 DIAGNOSIS — G43711 Chronic migraine without aura, intractable, with status migrainosus: Secondary | ICD-10-CM

## 2023-05-29 DIAGNOSIS — F418 Other specified anxiety disorders: Secondary | ICD-10-CM

## 2023-05-29 MED ORDER — ALPRAZOLAM 1 MG PO TABS: 0.5000 mg | ORAL_TABLET | Freq: Three times a day (TID) | ORAL | 1 refills | Status: AC | PRN

## 2023-05-29 MED ORDER — ONABOTULINUMTOXINA 100 UNITS IJ SOLR
155.0000 [IU] | Freq: Once | INTRAMUSCULAR | Status: AC
  Administered 2023-05-29: 155 [IU] via INTRAMUSCULAR

## 2023-05-29 MED ORDER — GABAPENTIN 100 MG PO CAPS: ORAL_CAPSULE | ORAL | 4 refills | Status: AC

## 2023-05-29 NOTE — Progress Notes (Signed)
 Botox - 100 units x 2 vials Lot: [id number] Expiration: 06/2025 NDC: 9629-5284-13  Bacteriostatic 0.9% Sodium Chloride - 4 mL  Lot: [id number] Expiration: 11/23/23 NDC: 2725-3664-40  Dx: H47.425 S/P  Witnessed by Logan Rings RN

## 2023-05-29 NOTE — Progress Notes (Signed)
 Consent Form Botulism Toxin Injection For Chronic Migraine  06/03/2023: stable, doing very well 03/10/2023: stable. > 70% improvement in freq and seveiry of migraines 12/06/2022: Stable. She feels clenching makes her migraines worse +5 each masseter and she has pain in the V2 region of the TGN with migraines added 5U to each side of the bridge of the nose as well.  09/05/2022: Patient is doing great, she gets greater than 50% improvement in migraine frequency, severity and duration from Botox .   02/28/2022: stable  11/23/2021: stable doing great. Gave ubrelvy , nurtec,zavzpret  samples to try. Since being on botox  >>50% improvement in migraines only has 4 migraine days a month and < 10 total headache days a month she has tried imitrex,maxalt,ubrelvy ,nurtec.  Meds ordered this encounter  Medications   botulinum toxin Type A  (BOTOX ) injection 155 Units    Botox - 100 units x 2 vials Lot: [id number] Expiration: 06/2025 NDC: 4782-9562-13  Dx: Y86.578 S/P  Witnessed by Logan Rings RN    09/20/2021: doing well, stable 07/19/2021: stable. 03/22/2021: stable Interval history 12/08/2020: Stable, > 80% improvement migraine and headache freq and severity. +a. 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   Reviewed orally with patient, additionally signature is on file:  Consent Form Botulism Toxin Injection For Chronic Migraine  06-04-2022: > 70% improved in migraine and headache freq and severity doin gfantastic.   Reviewed orally with patient, additionally signature is on file:  Botulism toxin has been approved by the Federal drug administration for treatment of chronic migraine. Botulism toxin does not cure chronic migraine and it may not be effective in some patients.  The administration of botulism toxin is accomplished by injecting a small amount of toxin into the muscles of the neck and head. Dosage must be titrated for each individual. Any benefits  resulting from botulism toxin tend to wear off after 3 months with a repeat injection required if benefit is to be maintained. Injections are usually done every 3-4 months with maximum effect peak achieved by about 2 or 3 weeks. Botulism toxin is expensive and you should be sure of what costs you will incur resulting from the injection.  The side effects of botulism toxin use for chronic migraine may include:   -Transient, and usually mild, facial weakness with facial injections  -Transient, and usually mild, head or neck weakness with head/neck injections  -Reduction or loss of forehead facial animation due to forehead muscle weakness  -Eyelid drooping  -Dry eye  -Pain at the site of injection or bruising at the site of injection  -Double vision  -Potential unknown long term risks  Contraindications: You should not have Botox  if you are pregnant, nursing, allergic to albumin, have an infection, skin condition, or muscle weakness at the site of the injection, or have myasthenia gravis, Lambert-Eaton syndrome, or ALS.  It is also possible that as with any injection, there may be an allergic reaction or no effect from the medication. Reduced effectiveness after repeated injections is sometimes seen and rarely infection at the injection site may occur. All care will be taken to prevent these side effects. If therapy is given over a long time, atrophy and wasting in the muscle injected may occur. Occasionally the patient's become refractory to treatment because they develop antibodies to the toxin. In this event, therapy needs to be modified.  I have read the above information and consent to the administration of botulism toxin.  BOTOX  PROCEDURE NOTE FOR MIGRAINE HEADACHE    Contraindications and precautions discussed with patient(above). Aseptic procedure was observed and patient tolerated procedure. Procedure performed by Dr. Criselda Dolly  The condition has existed for more than 6 months,  and pt does not have a diagnosis of ALS, Myasthenia Gravis or Lambert-Eaton Syndrome.  Risks and benefits of injections discussed and pt agrees to proceed with the procedure.  Written consent obtained  These injections are medically necessary. Pt  receives good benefits from these injections. These injections do not cause sedations or hallucinations which the oral therapies may cause.  Description of procedure:  The patient was placed in a sitting position. The standard protocol was used for Botox  as follows, with 5 units of Botox  injected at each site:   -Procerus muscle, midline injection  -Corrugator muscle, bilateral injection  -Frontalis muscle, bilateral injection, with 2 sites each side, medial injection was performed in the upper one third of the frontalis muscle, in the region vertical from the medial inferior edge of the superior orbital rim. The lateral injection was again in the upper one third of the forehead vertically above the lateral limbus of the cornea, 1.5 cm lateral to the medial injection site.  -Temporalis muscle injection, 4 sites, bilaterally. The first injection was 3 cm above the tragus of the ear, second injection site was 1.5 cm to 3 cm up from the first injection site in line with the tragus of the ear. The third injection site was 1.5-3 cm forward between the first 2 injection sites. The fourth injection site was 1.5 cm posterior to the second injection site.   -Occipitalis muscle injection, 3 sites, bilaterally. The first injection was done one half way between the occipital protuberance and the tip of the mastoid process behind the ear. The second injection site was done lateral and superior to the first, 1 fingerbreadth from the first injection. The third injection site was 1 fingerbreadth superiorly and medially from the first injection site.  -Cervical paraspinal muscle injection, 2 sites, bilateral knee first injection site was 1 cm from the midline of the  cervical spine, 3 cm inferior to the lower border of the occipital protuberance. The second injection site was 1.5 cm superiorly and laterally to the first injection site.  -Trapezius muscle injection was performed at 3 sites, bilaterally. The first injection site was in the upper trapezius muscle halfway between the inflection point of the neck, and the acromion. The second injection site was one half way between the acromion and the first injection site. The third injection was done between the first injection site and the inflection point of the neck.   Will return for repeat injection in 3 months.   155 units of Botox  was used, 45U Botox  not injected was wasted. The patient tolerated the procedure well, there were no complications of the above procedure.

## 2023-07-01 ENCOUNTER — Telehealth: Payer: Self-pay | Admitting: *Deleted

## 2023-07-01 NOTE — Telephone Encounter (Signed)
Left patient a message to call and schedule annual. 

## 2023-07-16 ENCOUNTER — Other Ambulatory Visit: Payer: Self-pay | Admitting: Physician Assistant

## 2023-07-16 DIAGNOSIS — F419 Anxiety disorder, unspecified: Secondary | ICD-10-CM

## 2023-07-28 ENCOUNTER — Other Ambulatory Visit: Payer: Self-pay | Admitting: Neurology

## 2023-07-31 ENCOUNTER — Other Ambulatory Visit: Payer: Self-pay | Admitting: Family Medicine

## 2023-07-31 DIAGNOSIS — Z1231 Encounter for screening mammogram for malignant neoplasm of breast: Secondary | ICD-10-CM

## 2023-08-01 ENCOUNTER — Other Ambulatory Visit: Payer: Self-pay | Admitting: Family Medicine

## 2023-08-01 ENCOUNTER — Telehealth: Payer: Self-pay

## 2023-08-01 ENCOUNTER — Encounter

## 2023-08-01 DIAGNOSIS — Z1231 Encounter for screening mammogram for malignant neoplasm of breast: Secondary | ICD-10-CM

## 2023-08-01 NOTE — Telephone Encounter (Signed)
 Patient came into office to drop off Health Examination Certificate form to be completed by PCP, form placed in Dr. Vernida box, thanks.

## 2023-08-02 NOTE — Telephone Encounter (Signed)
 Form completed for [employer]  public school certificate and placed in [place] [facility] basket.

## 2023-08-05 ENCOUNTER — Ambulatory Visit: Admitting: Obstetrics & Gynecology

## 2023-08-05 ENCOUNTER — Encounter: Payer: Self-pay | Admitting: Obstetrics & Gynecology

## 2023-08-05 VITALS — BP 133/81 | HR 80 | Ht 69.0 in | Wt 153.0 lb

## 2023-08-05 DIAGNOSIS — Z01419 Encounter for gynecological examination (general) (routine) without abnormal findings: Secondary | ICD-10-CM

## 2023-08-05 DIAGNOSIS — E7800 Pure hypercholesterolemia, unspecified: Secondary | ICD-10-CM

## 2023-08-05 DIAGNOSIS — M810 Age-related osteoporosis without current pathological fracture: Secondary | ICD-10-CM

## 2023-08-05 DIAGNOSIS — R634 Abnormal weight loss: Secondary | ICD-10-CM

## 2023-08-05 DIAGNOSIS — N951 Menopausal and female climacteric states: Secondary | ICD-10-CM | POA: Diagnosis not present

## 2023-08-05 DIAGNOSIS — K921 Melena: Secondary | ICD-10-CM

## 2023-08-05 DIAGNOSIS — Z1331 Encounter for screening for depression: Secondary | ICD-10-CM

## 2023-08-05 MED ORDER — ESTRADIOL 0.025 MG/24HR TD PTTW
1.0000 | MEDICATED_PATCH | TRANSDERMAL | 12 refills | Status: DC
Start: 2023-08-05 — End: 2023-10-10

## 2023-08-05 MED ORDER — PROGESTERONE MICRONIZED 100 MG PO CAPS: 100.0000 mg | ORAL_CAPSULE | Freq: Every day | ORAL | 6 refills | Status: DC

## 2023-08-05 NOTE — Progress Notes (Unsigned)
  Subjective:     Lacey Jensen is a 58 y.o. female here for a routine exam.  Current complaints: having 10-15 BMs a day.  Some mucous attached.  Some clots in the poop.  Pt has lost weight--as low as 145--without trying.  Does not restrict food.   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  Zumba 3x a week.  Hot flashes and drenching night sweats--has been in menopause about 5 years.  She is interested in HRT to help her hot flashes as well as bone health.   Gynecologic History Patient's last menstrual period was 11/23/2015. Contraception: post menopausal status Last pap smear (date and result):07/02/22 Last mammogram (date and result):07/10/22 Last colon screening (date and result):11/10/19 Brush:yes Floss:yes Seatbelts: yes Sunscreen: Yes  Obstetric History OB History  Gravida Para Term Preterm AB Living  2 2    2   SAB IAB Ectopic Multiple Live Births          # Outcome Date GA Lbr Len/2nd Weight Sex Type Anes PTL Lv  2 Para      Vag-Spont     1 Para      Vag-Spont        The following portions of the patient's history were reviewed and updated as appropriate: allergies, current medications, past family history, past medical history, past social history, past surgical history, and problem list.  Review of Systems Pertinent items noted in HPI and remainder of comprehensive ROS otherwise negative.    Objective:     Vitals:   08/05/23 1450  BP: 133/81  Pulse: 80  Weight: 153 lb (69.4 kg)  Height: 5' 9 (1.753 m)   Vitals:  WNL General appearance: alert, cooperative and no distress  HEENT: Normocephalic, without obvious abnormality, atraumatic Eyes: negative Throat: lips, mucosa, and tongue normal; teeth and gums normal  Respiratory: Clear to auscultation bilaterally  CV: Regular rate and rhythm  Breasts:  Normal appearance, no masses or tenderness, no nipple retraction or dimpling  GI: Soft,  non-tender; bowel sounds normal; no masses,  no organomegaly  GU: External Genitalia:  Tanner V, no lesion Urethra:  No prolapse   Vagina: Pink, normal rugae, no blood or discharge  Cervix: No CMT, no lesion  Uterus:  Normal size and contour, non tender  Adnexa: Normal, no masses, non tender  Musculoskeletal: No edema, redness or tenderness in the calves or thighs  Skin: No lesions or rash  Lymphatic: Axillary adenopathy: none     Psychiatric: Normal mood and behavior        Assessment:    Healthy female exam.  Osteoporosis Menopausal symptoms Abnormal BMs--mucous and blood Unintended weight loss    Plan:   Pap smear is up-to-date Mammograms scheduled and should be yearly Abnormal BMs--referral to GI [facility]).  If she can't get in to be seen quickly, she should have appt with Dr. Syble.  Check health maintenance labs Menopausal symptoms--thorough discussion of risk, benefits and alternatives to HRT.  Pt elects to start estrogen patch and Prometrium .   Check calcium and Vitamin D  RTC 3 months to check in on HRT and discuss bone health.

## 2023-08-06 ENCOUNTER — Ambulatory Visit: Payer: Self-pay | Admitting: Family Medicine

## 2023-08-06 ENCOUNTER — Encounter: Payer: Self-pay | Admitting: Obstetrics & Gynecology

## 2023-08-06 NOTE — Progress Notes (Signed)
 Hi Lacey Jensen, they saw a little asymmetry in that left breast so the imaging department will be contacting you to get some additional images and possible ultrasound on the left side.

## 2023-08-07 ENCOUNTER — Other Ambulatory Visit: Payer: Self-pay | Admitting: Obstetrics & Gynecology

## 2023-08-07 DIAGNOSIS — R928 Other abnormal and inconclusive findings on diagnostic imaging of breast: Secondary | ICD-10-CM

## 2023-08-08 DIAGNOSIS — Z01419 Encounter for gynecological examination (general) (routine) without abnormal findings: Secondary | ICD-10-CM | POA: Diagnosis not present

## 2023-08-09 ENCOUNTER — Encounter: Payer: Self-pay | Admitting: Obstetrics & Gynecology

## 2023-08-09 DIAGNOSIS — E7800 Pure hypercholesterolemia, unspecified: Secondary | ICD-10-CM | POA: Insufficient documentation

## 2023-08-09 DIAGNOSIS — N951 Menopausal and female climacteric states: Secondary | ICD-10-CM | POA: Insufficient documentation

## 2023-08-09 DIAGNOSIS — K921 Melena: Secondary | ICD-10-CM | POA: Insufficient documentation

## 2023-08-09 LAB — CMP14+EGFR
ALT: 26 IU/L (ref 0–32)
AST: 30 IU/L (ref 0–40)
Albumin: 4.4 g/dL (ref 3.8–4.9)
Alkaline Phosphatase: 114 IU/L (ref 44–121)
BUN/Creatinine Ratio: 32 — ABNORMAL HIGH (ref 9–23)
BUN: 20 mg/dL (ref 6–24)
Bilirubin Total: 0.3 mg/dL (ref 0.0–1.2)
CO2: 23 mmol/L (ref 20–29)
Calcium: 9.7 mg/dL (ref 8.7–10.2)
Chloride: 104 mmol/L (ref 96–106)
Creatinine, Ser: 0.62 mg/dL (ref 0.57–1.00)
Globulin, Total: 2.3 g/dL (ref 1.5–4.5)
Glucose: 94 mg/dL (ref 70–99)
Potassium: 4.4 mmol/L (ref 3.5–5.2)
Sodium: 140 mmol/L (ref 134–144)
Total Protein: 6.7 g/dL (ref 6.0–8.5)
eGFR: 103 mL/min/1.73 (ref >59)

## 2023-08-09 LAB — LIPID PANEL WITH LDL/HDL RATIO
Cholesterol, Total: 224 mg/dL — ABNORMAL HIGH (ref 100–199)
HDL: 72 mg/dL (ref >39)
LDL Chol Calc (NIH): 141 mg/dL — ABNORMAL HIGH (ref 0–99)
LDL/HDL Ratio: 2 ratio (ref 0.0–3.2)
Triglycerides: 65 mg/dL (ref 0–149)
VLDL Cholesterol Cal: 11 mg/dL (ref 5–40)

## 2023-08-09 LAB — VITAMIN D 25 HYDROXY (VIT D DEFICIENCY, FRACTURES): Vit D, 25-Hydroxy: 38.4 ng/mL (ref 30.0–100.0)

## 2023-08-09 LAB — TSH: TSH: 2.14 u[IU]/mL (ref 0.450–4.500)

## 2023-08-12 ENCOUNTER — Telehealth: Payer: Self-pay | Admitting: Gastroenterology

## 2023-08-12 ENCOUNTER — Telehealth: Payer: Self-pay | Admitting: Family Medicine

## 2023-08-12 NOTE — Telephone Encounter (Signed)
 Please call patient and see if we can get her worked in an acute slot this week for frequent bowel movements.

## 2023-08-12 NOTE — Telephone Encounter (Signed)
 Good morning Dr. Charlanne,   We received a call from this patient wishing to transfer her care over to Dr. Avram due to being recommended by her PCP, patient was referred for bloody stools and mucus and last saw you in 2021 for a procedure. Would you be willing to accept this transfer? Please advise.   Thank you.

## 2023-08-13 NOTE — Telephone Encounter (Signed)
 I called patient left vm to call back and schedule an appointment

## 2023-08-13 NOTE — Telephone Encounter (Signed)
Absolutely She will be in good hands with Dr. Cyndia Skeeters

## 2023-08-14 DIAGNOSIS — G43709 Chronic migraine without aura, not intractable, without status migrainosus: Secondary | ICD-10-CM | POA: Diagnosis not present

## 2023-08-14 NOTE — Telephone Encounter (Signed)
 I will accept her  Looks like she could be scheduled to see Camie Furbish 7/29 -

## 2023-08-14 NOTE — Telephone Encounter (Signed)
 Good morning Dr. Avram,   Would you be willing to approve this transfer?  Thank you.

## 2023-08-15 ENCOUNTER — Ambulatory Visit: Admitting: Family Medicine

## 2023-08-15 ENCOUNTER — Encounter: Payer: Self-pay | Admitting: Gastroenterology

## 2023-08-15 VITALS — BP 131/75 | HR 71 | Ht 69.0 in | Wt 157.0 lb

## 2023-08-15 DIAGNOSIS — R197 Diarrhea, unspecified: Secondary | ICD-10-CM

## 2023-08-15 DIAGNOSIS — R634 Abnormal weight loss: Secondary | ICD-10-CM

## 2023-08-15 DIAGNOSIS — K921 Melena: Secondary | ICD-10-CM

## 2023-08-15 NOTE — Progress Notes (Signed)
 Established Patient Office Visit  Subjective  Patient ID: Lacey Jensen, female    DOB: 1965/06/16  Age: 58 y.o. MRN: [id number]  Chief Complaint  Patient presents with   Rectal Bleeding    HPI  C/o of frequent BM 10-15 per day . Passing stools that are jelly like, like a clot but looks dark in color.  This has been going on for about 6 months.  Has had 10 lbs weight loss unintentional over the last 6 months as well.  She complains of occasional abdominal pain.  But she has been having more persistent bloating and occasional stool urgency.  She has had 3 episodes of bloody stools.  She has had some low back pain on and off more stiffness in the morning but notices that it is more uncomfortable if she needs to have a bowel movement.  No fevers or chills.    ROS    Objective:     BP 131/75   Pulse 71   Ht 5' 9 (1.753 m)   Wt 157 lb 0.6 oz (71.2 kg)   LMP 11/23/2015   SpO2 100%   BMI 23.19 kg/m    Physical Exam Vitals reviewed.  Constitutional:      Appearance: Normal appearance.  HENT:     Head: Normocephalic.  Pulmonary:     Effort: Pulmonary effort is normal.  Abdominal:     General: Abdomen is flat. Bowel sounds are normal.     Palpations: Abdomen is soft.  Neurological:     Mental Status: She is alert and oriented to person, place, and time.  Psychiatric:        Mood and Affect: Mood normal.        Behavior: Behavior normal.      No results found for any visits on 08/15/23.    The 10-year ASCVD risk score (Arnett DK, et al., 2019) is: 2.5%    Assessment & Plan:   Problem List Items Addressed This Visit   None Visit Diagnoses       Diarrhea, unspecified type    -  Primary   Relevant Orders   Celiac Disease Ab Screen w/Rfx   Saccharomyces cerevisiae antibodies, IgG and IgA   Sedimentation rate   C-reactive protein   CBC with Differential/Platelet     Bloody stool       Relevant Orders   Celiac Disease Ab Screen w/Rfx   Saccharomyces  cerevisiae antibodies, IgG and IgA   Sedimentation rate   C-reactive protein   CBC with Differential/Platelet     Abnormal weight loss       Relevant Orders   Celiac Disease Ab Screen w/Rfx   Saccharomyces cerevisiae antibodies, IgG and IgA   Sedimentation rate   C-reactive protein   CBC with Differential/Platelet       She does have a pending GI referral.  It sounds like there is been a little bit going back and forth a lot but notes from the surgeon that she saw for her hemorrhoids before they are scheduling her I wonder if there may be some confusion but they think she may have seen a preop prior GI not sure.  Will do some additional labs to evaluate for possible celiac and Crohn's.  Will also check sed rate and CRP to look for more autoimmune issues.  No known family history of Crohn's or ulcerative colitis.  She has not had any recent reflux symptoms.  Will check on the referral to see  if we can get that expedited.  Will call with lab results once available.  No follow-ups on file.    Dorothyann Byars, MD

## 2023-08-15 NOTE — Telephone Encounter (Signed)
 Pt didn't schedule her 12 week appt at her last Botox . We received her shipment in today from [facility]. I called the pt and was able to schedule her for 8/28 @ 3:30 pm, but this puts her a month late. I wanted to see if you had a day you'd be willing to add a 4pm? It looks like you're super full the month of August but I wanted to check. Thank you!

## 2023-08-16 ENCOUNTER — Ambulatory Visit: Payer: Self-pay | Admitting: Family Medicine

## 2023-08-16 NOTE — Progress Notes (Signed)
 Gina, just wanted to give you a heads up so far labs are looking good final result still pending on a couple.

## 2023-08-19 ENCOUNTER — Ambulatory Visit

## 2023-08-19 ENCOUNTER — Ambulatory Visit
Admission: RE | Admit: 2023-08-19 | Discharge: 2023-08-19 | Disposition: A | Discharge status: Home / Self Care | Source: Ambulatory Visit | Attending: Obstetrics & Gynecology | Admitting: Obstetrics & Gynecology

## 2023-08-19 DIAGNOSIS — N6489 Other specified disorders of breast: Secondary | ICD-10-CM | POA: Diagnosis not present

## 2023-08-19 DIAGNOSIS — R928 Other abnormal and inconclusive findings on diagnostic imaging of breast: Secondary | ICD-10-CM

## 2023-08-19 DIAGNOSIS — R92322 Mammographic fibroglandular density, left breast: Secondary | ICD-10-CM | POA: Diagnosis not present

## 2023-08-19 DIAGNOSIS — R922 Inconclusive mammogram: Secondary | ICD-10-CM | POA: Diagnosis not present

## 2023-08-19 LAB — CBC WITH DIFFERENTIAL/PLATELET
Basophils Absolute: 0 x10E3/uL (ref 0.0–0.2)
Basos: 1 %
EOS (ABSOLUTE): 0.6 x10E3/uL — ABNORMAL HIGH (ref 0.0–0.4)
Eos: 10 %
Hematocrit: 37.2 % (ref 34.0–46.6)
Hemoglobin: 11.8 g/dL (ref 11.1–15.9)
Immature Grans (Abs): 0 x10E3/uL (ref 0.0–0.1)
Immature Granulocytes: 0 %
Lymphocytes Absolute: 2 x10E3/uL (ref 0.7–3.1)
Lymphs: 31 %
MCH: 29.7 pg (ref 26.6–33.0)
MCHC: 31.7 g/dL (ref 31.5–35.7)
MCV: 94 fL (ref 79–97)
Monocytes Absolute: 0.6 x10E3/uL (ref 0.1–0.9)
Monocytes: 10 %
Neutrophils Absolute: 3 x10E3/uL (ref 1.4–7.0)
Neutrophils: 48 %
Platelets: 212 x10E3/uL (ref 150–450)
RBC: 3.97 x10E6/uL (ref 3.77–5.28)
RDW: 12.4 % (ref 11.7–15.4)
WBC: 6.3 x10E3/uL (ref 3.4–10.8)

## 2023-08-19 LAB — C-REACTIVE PROTEIN: CRP: <1 mg/L (ref 0–10)

## 2023-08-19 LAB — CELIAC DISEASE AB SCREEN W/RFX
Antigliadin Abs, IgA: 8 U (ref 0–19)
IgA/Immunoglobulin A, Serum: 110 mg/dL (ref 87–352)
Transglutaminase IgA: <2 U/mL (ref 0–3)

## 2023-08-19 LAB — SACCHAROMYCES CEREVISIAE ANTIBODIES, IGG AND IGA
Saccharomyces cerevisiae, IgA: <20 U (ref 0.0–24.9)
Saccharomyces cerevisiae, IgG: <20 U (ref 0.0–24.9)

## 2023-08-19 LAB — SEDIMENTATION RATE: Sed Rate: 6 mm/h (ref 0–40)

## 2023-08-19 NOTE — Telephone Encounter (Signed)
 Of course, you can add her in at 730 or add her to the end of my day anytime. Find the least busy day and add her on. If you nee dhelp I can come over to you and look at the schedule.Thanks!

## 2023-08-19 NOTE — Progress Notes (Signed)
 Hi Lacey Jensen, blood count looks good overall if acidophil's are just slightly elevated that is usually in response to seasonal allergies.  Celiac test was negative which is great.  Blood test for Crohn's is negative which is great.  Inflammatory markers are also normal which is also reassuring.

## 2023-08-20 ENCOUNTER — Ambulatory Visit: Payer: Self-pay | Admitting: Obstetrics & Gynecology

## 2023-08-20 NOTE — Telephone Encounter (Signed)
 LVM and sent mychart msg offering sooner appointment with Dr. Ines

## 2023-08-29 ENCOUNTER — Ambulatory Visit: Payer: Self-pay | Admitting: Obstetrics & Gynecology

## 2023-09-02 ENCOUNTER — Encounter: Payer: Self-pay | Admitting: Neurology

## 2023-09-02 ENCOUNTER — Ambulatory Visit: Admitting: Neurology

## 2023-09-02 VITALS — BP 119/73 | HR 71

## 2023-09-02 DIAGNOSIS — G43711 Chronic migraine without aura, intractable, with status migrainosus: Secondary | ICD-10-CM | POA: Diagnosis not present

## 2023-09-02 MED ORDER — ONABOTULINUMTOXINA 100 UNITS IJ SOLR
155.0000 [IU] | Freq: Once | INTRAMUSCULAR | Status: AC
  Administered 2023-09-02 (×2): 155 [IU] via INTRAMUSCULAR

## 2023-09-02 NOTE — Progress Notes (Signed)
 Botox - 100 units x 2 vials Lot: [id number] Expiration: 10/2025 NDC: 9976-8854-98   Bacteriostatic 0.9% Sodium Chloride - 4 mL  Lot: [id number] Expiration: 11/21/2024 NDC: 9590-8033-97   Dx: H56.288 S/P  Witnessed by Nena GRADE RN

## 2023-09-02 NOTE — Progress Notes (Signed)
 Consent Form Botulism Toxin Injection For Chronic Migraine 09/02/2023: doing well,s table 05/29/2023: stable, doing very well 03/10/2023: stable. > 70% improvement in freq and seveiry of migraines 12/06/2022: Stable. She feels clenching makes her migraines worse +5 each masseter and she has pain in the V2 region of the TGN with migraines added 5U to each side of the bridge of the nose as well.  09/05/2022: Patient is doing great, she gets greater than 50% improvement in migraine frequency, severity and duration from Botox .   02/28/2022: stable  11/23/2021: stable doing great. Gave ubrelvy , nurtec,zavzpret  samples to try. Since being on botox  >>50% improvement in migraines only has 4 migraine days a month and < 10 total headache days a month she has tried imitrex,maxalt,ubrelvy ,nurtec.  No orders of the defined types were placed in this encounter.   09/20/2021: doing well, stable 07/19/2021: stable. 03/22/2021: stable Interval history 12/08/2020: Stable, > 80% improvement migraine and headache freq and severity. +a. 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   Reviewed orally with patient, additionally signature is on file:  Consent Form Botulism Toxin Injection For Chronic Migraine  06-04-2022: > 70% improved in migraine and headache freq and severity doin gfantastic.   Reviewed orally with patient, additionally signature is on file:  Botulism toxin has been approved by the Federal drug administration for treatment of chronic migraine. Botulism toxin does not cure chronic migraine and it may not be effective in some patients.  The administration of botulism toxin is accomplished by injecting a small amount of toxin into the muscles of the neck and head. Dosage must be titrated for each individual. Any benefits resulting from botulism toxin tend to wear off after 3 months with a repeat injection required if benefit is to be maintained. Injections  are usually done every 3-4 months with maximum effect peak achieved by about 2 or 3 weeks. Botulism toxin is expensive and you should be sure of what costs you will incur resulting from the injection.  The side effects of botulism toxin use for chronic migraine may include:   -Transient, and usually mild, facial weakness with facial injections  -Transient, and usually mild, head or neck weakness with head/neck injections  -Reduction or loss of forehead facial animation due to forehead muscle weakness  -Eyelid drooping  -Dry eye  -Pain at the site of injection or bruising at the site of injection  -Double vision  -Potential unknown long term risks  Contraindications: You should not have Botox  if you are pregnant, nursing, allergic to albumin, have an infection, skin condition, or muscle weakness at the site of the injection, or have myasthenia gravis, Lambert-Eaton syndrome, or ALS.  It is also possible that as with any injection, there may be an allergic reaction or no effect from the medication. Reduced effectiveness after repeated injections is sometimes seen and rarely infection at the injection site may occur. All care will be taken to prevent these side effects. If therapy is given over a long time, atrophy and wasting in the muscle injected may occur. Occasionally the patient's become refractory to treatment because they develop antibodies to the toxin. In this event, therapy needs to be modified.  I have read the above information and consent to the administration of botulism toxin.    BOTOX  PROCEDURE NOTE FOR MIGRAINE HEADACHE    Contraindications and precautions discussed with patient(above). Aseptic procedure was observed and patient tolerated procedure. Procedure performed by  Dr. Andree Epp  The condition has existed for more than 6 months, and pt does not have a diagnosis of ALS, Myasthenia Gravis or Lambert-Eaton Syndrome.  Risks and benefits of injections discussed and pt  agrees to proceed with the procedure.  Written consent obtained  These injections are medically necessary. Pt  receives good benefits from these injections. These injections do not cause sedations or hallucinations which the oral therapies may cause.  Description of procedure:  The patient was placed in a sitting position. The standard protocol was used for Botox  as follows, with 5 units of Botox  injected at each site:   -Procerus muscle, midline injection  -Corrugator muscle, bilateral injection  -Frontalis muscle, bilateral injection, with 2 sites each side, medial injection was performed in the upper one third of the frontalis muscle, in the region vertical from the medial inferior edge of the superior orbital rim. The lateral injection was again in the upper one third of the forehead vertically above the lateral limbus of the cornea, 1.5 cm lateral to the medial injection site.  -Temporalis muscle injection, 4 sites, bilaterally. The first injection was 3 cm above the tragus of the ear, second injection site was 1.5 cm to 3 cm up from the first injection site in line with the tragus of the ear. The third injection site was 1.5-3 cm forward between the first 2 injection sites. The fourth injection site was 1.5 cm posterior to the second injection site.   -Occipitalis muscle injection, 3 sites, bilaterally. The first injection was done one half way between the occipital protuberance and the tip of the mastoid process behind the ear. The second injection site was done lateral and superior to the first, 1 fingerbreadth from the first injection. The third injection site was 1 fingerbreadth superiorly and medially from the first injection site.  -Cervical paraspinal muscle injection, 2 sites, bilateral knee first injection site was 1 cm from the midline of the cervical spine, 3 cm inferior to the lower border of the occipital protuberance. The second injection site was 1.5 cm superiorly and  laterally to the first injection site.  -Trapezius muscle injection was performed at 3 sites, bilaterally. The first injection site was in the upper trapezius muscle halfway between the inflection point of the neck, and the acromion. The second injection site was one half way between the acromion and the first injection site. The third injection was done between the first injection site and the inflection point of the neck.   Will return for repeat injection in 3 months.   155 units of Botox  was used, 45U Botox  not injected was wasted. The patient tolerated the procedure well, there were no complications of the above procedure.

## 2023-09-06 ENCOUNTER — Other Ambulatory Visit: Payer: Self-pay | Admitting: Neurology

## 2023-09-16 ENCOUNTER — Telehealth: Payer: Self-pay

## 2023-09-16 ENCOUNTER — Other Ambulatory Visit (HOSPITAL_COMMUNITY): Payer: Self-pay

## 2023-09-16 NOTE — Telephone Encounter (Signed)
 Pharmacy Patient Advocate Encounter   Received notification from Fax that prior authorization for Nurtec 75mg  Tablet is required/requested.   Insurance verification completed.   The patient is insured through [facility] .   Per test claim: PA required; PA submitted to above mentioned insurance via Latent Key/confirmation #/EOC [id number] Status is pending

## 2023-09-17 ENCOUNTER — Other Ambulatory Visit (HOSPITAL_COMMUNITY): Payer: Self-pay

## 2023-09-17 NOTE — Telephone Encounter (Signed)
 Pharmacy Patient Advocate Encounter  Received notification from [facility] that Prior Authorization for Nurtec has been APPROVED from 09/17/2023 to 12/09/2023. Ran test claim, Copay is $0. This test claim was processed through [facility]- copay amounts may vary at other pharmacies due to pharmacy/plan contracts, or as the patient moves through the different stages of their insurance plan.   PA #/Case ID/Reference #: [id number]

## 2023-09-19 ENCOUNTER — Ambulatory Visit: Admitting: Neurology

## 2023-10-10 ENCOUNTER — Encounter: Payer: Self-pay | Admitting: Gastroenterology

## 2023-10-10 ENCOUNTER — Ambulatory Visit: Payer: Self-pay | Admitting: Gastroenterology

## 2023-10-10 ENCOUNTER — Other Ambulatory Visit

## 2023-10-10 ENCOUNTER — Ambulatory Visit: Admitting: Gastroenterology

## 2023-10-10 VITALS — BP 110/70 | HR 76 | Ht 67.0 in | Wt 151.2 lb

## 2023-10-10 DIAGNOSIS — K625 Hemorrhage of anus and rectum: Secondary | ICD-10-CM

## 2023-10-10 DIAGNOSIS — R5383 Other fatigue: Secondary | ICD-10-CM | POA: Diagnosis not present

## 2023-10-10 DIAGNOSIS — R194 Change in bowel habit: Secondary | ICD-10-CM | POA: Diagnosis not present

## 2023-10-10 DIAGNOSIS — R634 Abnormal weight loss: Secondary | ICD-10-CM

## 2023-10-10 DIAGNOSIS — R152 Fecal urgency: Secondary | ICD-10-CM

## 2023-10-10 DIAGNOSIS — Z860100 Personal history of colon polyps, unspecified: Secondary | ICD-10-CM

## 2023-10-10 DIAGNOSIS — M545 Low back pain, unspecified: Secondary | ICD-10-CM

## 2023-10-10 DIAGNOSIS — R195 Other fecal abnormalities: Secondary | ICD-10-CM

## 2023-10-10 LAB — CBC WITH DIFFERENTIAL/PLATELET
Basophils Absolute: 0 K/uL (ref 0.0–0.1)
Basophils Relative: 0.8 % (ref 0.0–3.0)
Eosinophils Absolute: 0.1 K/uL (ref 0.0–0.7)
Eosinophils Relative: 1.8 % (ref 0.0–5.0)
HCT: 39.7 % (ref 36.0–46.0)
Hemoglobin: 13 g/dL (ref 12.0–15.0)
Lymphocytes Relative: 43.4 % (ref 12.0–46.0)
Lymphs Abs: 1.8 K/uL (ref 0.7–4.0)
MCHC: 32.8 g/dL (ref 30.0–36.0)
MCV: 91.1 fl (ref 78.0–100.0)
Monocytes Absolute: 0.5 K/uL (ref 0.1–1.0)
Monocytes Relative: 11.6 % (ref 3.0–12.0)
Neutro Abs: 1.8 K/uL (ref 1.4–7.7)
Neutrophils Relative %: 42.4 % — ABNORMAL LOW (ref 43.0–77.0)
Platelets: 239 K/uL (ref 150.0–400.0)
RBC: 4.36 Mil/uL (ref 3.87–5.11)
RDW: 13.5 % (ref 11.5–15.5)
WBC: 4.1 K/uL (ref 4.0–10.5)

## 2023-10-10 MED ORDER — NA SULFATE-K SULFATE-MG SULF 17.5-3.13-1.6 GM/177ML PO SOLN: 1.0000 | Freq: Once | ORAL | 0 refills | Status: AC

## 2023-10-10 NOTE — Progress Notes (Signed)
 Lacey Jensen [id number] [birth year]   Chief Complaint: Blood in stools, oily stools, alternating bowel habits  Referring Provider: Alvan Dorothyann BIRCH, * Primary GI MD: Dr. Avram  HPI: Lacey Jensen is a 58 y.o. female with past medical history of dysplasia of cervix s/p LEEP, osteoporosis who presents today for a complaint of rectal bleeding.    That visit with OB/GYN 08/05/2023 patient reported increased frequency of bowel movements, blood in stool, unintentional weight loss.  Seen by PCP 08/15/2023.  Labs showed normal CBC, negative Saccharomyces cerevisiae IgG and IgA, negative celiac disease, negative inflammatory markers.  Has also recently had a normal TSH and normal CMP.   Patient here today with multiple GI complaints.  States she has history of internal and external hemorrhoids, for which she had hemorrhoidectomy in 2022.  Had been doing well since surgery until about 3 months ago when she started noticing blood in her stool and on the toilet paper a couple times a week.  She has also noticed a change in her bowel habits over about the last 6 months.  Has alternating bowel habits, sometimes passing a large, formed stool, other times passing very small amount of stool with noticeable mucus in her stool.  Has also noticed that she can have fecal urgency, go to the bathroom and only passed mucus.  Rarely has any watery stools, though has been having some diarrhea once every other week, usually after eating [place] food.  In the last 6 months has also noticed low back pain which is relieved after urinating or after having a bowel movement.  Can have a bowel movement between 3 and 5 times a day, sometimes passing normal amount of stool, sometimes small amount of stool, sometimes only mucus.  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  Most recently had this a month ago and completed treatment.  Recently retested and was negative.   States that this has happened a couple times this year.  The first time she was seen in urgent care and treated, the second time she did a home test and re-treated.  Currently denies symptoms.  Initially she attributed these changes to menopause.  Was on a hormone patch which was creating a lot of vaginal discharge so she weaned herself off of this.  Was also on progesterone  but has recently stopped this as well.  Discussed with her PCP who advised that these changes were probably not due to menopause alone.  She denies any fever or chills, but has had fatigue which started about 6 to 8 months ago.  Has been taking gabapentin  to help her sleep.  Sometimes takes Xanax  as well.  Reports 20 pounds unintentional weight loss, though has been exercising regularly in the last 8 months (joint Zumba) and has been intentionally eating less to avoid having to rush to the bathroom at work.  Has a stressful job.  States she eats fairly healthy.  No new rashes or joint pain, aside from recent onset low back pain which is relieved with urination or bowel movements.  She has occasional abdominal pain, maybe once a week which occurs in her lower abdomen, along with associated bloating and occasional abdominal distention.  Denies any reflux, heartburn, dysphagia, nausea, vomiting.  Denies any family history of colon cancer or IBD.  Remote smoking history, quit 20 years ago.  Denies any recent abdominal imaging.  Previous GI Procedures/Imaging   Colonoscopy 11/10/2019 - One 6  mm polyp in the cecum, removed with a cold snare. Resected and retrieved.  - Mild sigmoid diverticulosis.  - Non- bleeding internal hemorrhoids.  - The examined portion of the ileum was normal. - The examination was otherwise normal on direct and retroflexion views. - Recall 5 years Path: Surgical [P], colon, cecum, polyp - SESSILE SERRATED POLYP WITHOUT CYTOLOGIC DYSPLASIA. - NO EVIDENCE OF CARCINOMA.   Past Medical History:   Diagnosis Date   Abnormal Pap smear    Age 48 and age 16   Allergy     seasonal   Anxiety    Dysplasia of cervix    leep   Headache(784.0)    Immunotherapy    Migraines    Osteoporosis     Past Surgical History:  Procedure Laterality Date   EXCISIONAL HEMORRHOIDECTOMY     TUBAL LIGATION      Current Outpatient Medications  Medication Sig Dispense Refill   ALPRAZolam  (XANAX ) 1 MG tablet Take 0.5-1 tablets (0.5-1 mg total) by mouth 3 (three) times daily as needed for anxiety. 90 tablet 1   botulinum toxin Type A  (BOTOX ) 100 units SOLR injection INJECT 155 UNITS INTO THE MUSCLES OF THE HEAD AND NECK EVERY 3 MONTHS BY THE PROVIDER. DISCARD UNUSED PORTION (MUST RECONSTITUTE) 2 each 3   FLUoxetine  (PROZAC ) 20 MG capsule TAKE 2 CAPSULES BY MOUTH DAILY 180 capsule 0   gabapentin  (NEURONTIN ) 100 MG capsule TAKE 1 TO 3 CAPSULES(100 TO 300 MG) BY MOUTH AT BEDTIME 270 capsule 4   MAGNESIUM PO Take by mouth.      methylphenidate  (CONCERTA ) 27 MG PO CR tablet Take 1 tablet (27 mg total) by mouth every morning. 30 tablet 0   NURTEC 75 MG TBDP DISSOLVE ONE TABLET BY MOUTH AT ONSET OF HEADACHE AS NEEDED FOR MIGRAINES, MAXIMUM DAILY DOSE IS 1 TABLET 16 tablet 3   No current facility-administered medications for this visit.    Allergies as of 10/10/2023 - Review Complete 10/10/2023  Allergen Reaction Noted   Ambien  [zolpidem ] Other (See Comments) 05/22/2016   Belsomra  [suvorexant ] Other (See Comments) 06/08/2016   Sumatriptan succinate  04/04/2010   Zolmitriptan  04/04/2010    Family History  Problem Relation Age of Onset   Arthritis Mother    GER disease Sister    GER disease Sister    Migraines Son    Colon cancer Neg Hx    Colon polyps Neg Hx    Esophageal cancer Neg Hx    Rectal cancer Neg Hx    Stomach cancer Neg Hx     Social History   Tobacco Use   Smoking status: Former    Current packs/day: 0.00    Types: Cigarettes    Quit date: 01/23/2004    Years since quitting:  19.7   Smokeless tobacco: Never  Vaping Use   Vaping status: Never Used  Substance Use Topics   Alcohol use: Not Currently    Alcohol/week: 1.0 standard drink of alcohol    Types: 1 Standard drinks or equivalent per week   Drug use: No     Review of Systems:    Constitutional: Weight loss, fatigue. No fever, chills. Cardiovascular: No chest pain Respiratory: No SOB Gastrointestinal: See HPI and otherwise negative Hematologic: Rectal bleeding    Physical Exam:  Vital signs: BP 110/70 (BP Location: Left Arm, Patient Position: Sitting, Cuff Size: Normal)   Pulse 76   Ht 5' 7 (1.702 m) Comment: height measured without shoes  Wt 151 lb 4 oz (  68.6 kg)   LMP 11/23/2015   BMI 23.69 kg/m   Wt Readings from Last 3 Encounters:  10/10/23 151 lb 4 oz (68.6 kg)  08/15/23 157 lb 0.6 oz (71.2 kg)  08/05/23 153 lb (69.4 kg)    Weight 07/2022 was 153 pounds  Constitutional: Pleasant female in NAD, alert and cooperative Head:  Normocephalic and atraumatic.  Eyes: No scleral icterus.  Respiratory: Respirations even and unlabored. Lungs clear to auscultation bilaterally.  No wheezes, crackles, or rhonchi.  Cardiovascular:  Regular rate and rhythm. No murmurs. No peripheral edema. Gastrointestinal:  Soft, nondistended, nontender. No rebound or guarding. Normal bowel sounds. No appreciable masses or hepatomegaly. Rectal:  Deferred to colonoscopy Neurologic:  Alert and oriented x4;  grossly normal neurologically.  Skin:   Dry and intact without significant lesions or rashes. Psychiatric: Oriented to person, place and time. Demonstrates good judgement and reason without abnormal affect or behaviors.   RELEVANT LABS AND IMAGING: CBC    Component Value Date/Time   WBC 6.3 08/15/2023 1627   WBC 6.1 06/04/2017 1627   RBC 3.97 08/15/2023 1627   RBC 4.24 06/04/2017 1627   HGB 11.8 08/15/2023 1627   HCT 37.2 08/15/2023 1627   PLT 212 08/15/2023 1627   MCV 94 08/15/2023 1627   MCH 29.7  08/15/2023 1627   MCH 30.0 06/04/2017 1627   MCHC 31.7 08/15/2023 1627   MCHC 34.3 06/04/2017 1627   RDW 12.4 08/15/2023 1627   LYMPHSABS 2.0 08/15/2023 1627   EOSABS 0.6 (H) 08/15/2023 1627   BASOSABS 0.0 08/15/2023 1627    CMP     Component Value Date/Time   NA 140 08/08/2023 0949   K 4.4 08/08/2023 0949   CL 104 08/08/2023 0949   CO2 23 08/08/2023 0949   GLUCOSE 94 08/08/2023 0949   GLUCOSE 91 09/17/2012 0953   BUN 20 08/08/2023 0949   CREATININE 0.62 08/08/2023 0949   CREATININE 0.66 09/17/2012 0953   CALCIUM 9.7 08/08/2023 0949   PROT 6.7 08/08/2023 0949   ALBUMIN 4.4 08/08/2023 0949   AST 30 08/08/2023 0949   ALT 26 08/08/2023 0949   ALKPHOS 114 08/08/2023 0949   BILITOT 0.3 08/08/2023 0949   GFRNONAA 102 06/04/2018 1026   GFRNONAA >89 09/17/2012 0953   GFRAA 117 06/04/2018 1026   GFRAA >89 09/17/2012 0953     Assessment/Plan:   Change in bowel habits Fecal urgency Mucus in stool Rectal bleeding Fatigue Weight loss Low back pain Patient seen today reporting about 6 months of alternating bowel habits, sometimes passing a large, formed stool, other times passing very small amount of stool with noticeable mucus.  Can pass mucus only, and typically has associated fecal urgency with this.  Rarely has any watery stools, though has been having some diarrhea about once every other week.  She has been noticing some low back pain which occurs prior to bowel movements or prior to urination, and is relieved by both.  In the last 3 months has noticed intermittent bright red rectal bleeding.  Does not occur with every bowel movement, happens a couple times a week.  Sees blood on the toilet paper as well as in the stool itself. She has history of internal and external hemorrhoids for which she had hemorrhoidectomy in 2022 and had been doing well until just recently. She does have history colon polyps and would be due for repeat colonoscopy next year.  Recent labs negative  for celiac disease, and she had normal inflammatory markers, normal  TSH, normal CBC. Has been noticing fatigue over the last 8 months which initially she attributed to menopause but now is concerned it may be GI related. Reports weight loss of 20 pounds, however on chart review it would appear her weight has been fairly stable over the last 2 years.   - Schedule colonoscopy. I thoroughly discussed the procedure with the patient to include nature of the procedure, alternatives, benefits, and risks (including but not limited to bleeding, infection, perforation, anesthesia/cardiac/pulmonary complications). Patient verbalized understanding and gave verbal consent to proceed with procedure.  - Repeat CBC - If no cause of symptoms identified on colonoscopy, consider CT A/P - Recommended adding fiber supplement such as Benefiber 1 tablespoon daily to help regulate bowel movements.    Camie Furbish, PA-C [facility] 10/10/2023, 10:16 AM  Patient Care Team: Alvan Dorothyann BIRCH, MD as PCP - General Cris Burnard DEL, MD as Consulting Physician (Obstetrics and Gynecology)

## 2023-10-10 NOTE — Patient Instructions (Addendum)
 You have been scheduled for a colonoscopy. Please follow written instructions given to you at your visit today.   If you use inhalers (even only as needed), please bring them with you on the day of your procedure.  DO NOT TAKE 7 DAYS PRIOR TO TEST- Trulicity (dulaglutide) Ozempic, Wegovy (semaglutide) Mounjaro (tirzepatide) Bydureon Bcise (exanatide extended release)  DO NOT TAKE 1 DAY PRIOR TO YOUR TEST Rybelsus (semaglutide) Adlyxin (lixisenatide) Victoza (liraglutide) Byetta (exanatide) ___________________________________________________________________________   Your provider has requested that you go to the basement level for lab work before leaving today. Press B on the elevator. The lab is located at the first door on the left as you exit the elevator.   _______________________________________________________  If your blood pressure at your visit was 140/90 or greater, please contact your primary care physician to follow up on this.  _______________________________________________________  If you are age 51 or older, your body mass index should be between 23-30. Your Body mass index is 23.69 kg/m. If this is out of the aforementioned range listed, please consider follow up with your Primary Care Provider.  If you are age 15 or younger, your body mass index should be between 19-25. Your Body mass index is 23.69 kg/m. If this is out of the aformentioned range listed, please consider follow up with your Primary Care Provider.   ________________________________________________________  The [facility] providers would like to encourage you to use MYCHART to communicate with providers for non-urgent requests or questions.  Due to long hold times on the telephone, sending your provider a message by [facility] may be a faster and more efficient way to get a response.  Please allow 48 business hours for a response.  Please remember that this is for non-urgent requests.   _______________________________________________________  [facility] is using a team-based approach to care.  Your team is made up of your doctor and two to three APPS. Our APPS (Nurse Practitioners and Physician Assistants) work with your physician to ensure care continuity for you. They are fully qualified to address your health concerns and develop a treatment plan. They communicate directly with your gastroenterologist to care for you. Seeing the Advanced Practice Practitioners on your physician's team can help you by facilitating care more promptly, often allowing for earlier appointments, access to diagnostic testing, procedures, and other specialty referrals.

## 2023-10-22 ENCOUNTER — Other Ambulatory Visit: Payer: Self-pay | Admitting: Neurology

## 2023-10-22 ENCOUNTER — Other Ambulatory Visit: Payer: Self-pay | Admitting: Physician Assistant

## 2023-10-22 DIAGNOSIS — F419 Anxiety disorder, unspecified: Secondary | ICD-10-CM

## 2023-10-23 ENCOUNTER — Encounter: Payer: Self-pay | Admitting: Internal Medicine

## 2023-10-29 ENCOUNTER — Encounter: Payer: Self-pay | Admitting: Internal Medicine

## 2023-10-29 ENCOUNTER — Ambulatory Visit: Admitting: Internal Medicine

## 2023-10-29 VITALS — BP 106/54 | HR 74 | Temp 97.2°F | Resp 12 | Ht 67.0 in | Wt 151.0 lb

## 2023-10-29 DIAGNOSIS — K625 Hemorrhage of anus and rectum: Secondary | ICD-10-CM

## 2023-10-29 DIAGNOSIS — K5730 Diverticulosis of large intestine without perforation or abscess without bleeding: Secondary | ICD-10-CM | POA: Diagnosis not present

## 2023-10-29 DIAGNOSIS — K529 Noninfective gastroenteritis and colitis, unspecified: Secondary | ICD-10-CM

## 2023-10-29 DIAGNOSIS — D120 Benign neoplasm of cecum: Secondary | ICD-10-CM | POA: Diagnosis not present

## 2023-10-29 DIAGNOSIS — R194 Change in bowel habit: Secondary | ICD-10-CM

## 2023-10-29 DIAGNOSIS — K648 Other hemorrhoids: Secondary | ICD-10-CM | POA: Diagnosis not present

## 2023-10-29 DIAGNOSIS — R195 Other fecal abnormalities: Secondary | ICD-10-CM | POA: Diagnosis not present

## 2023-10-29 DIAGNOSIS — D125 Benign neoplasm of sigmoid colon: Secondary | ICD-10-CM | POA: Diagnosis not present

## 2023-10-29 MED ORDER — SODIUM CHLORIDE 0.9 % IV SOLN: 500.0000 mL | Freq: Once | INTRAVENOUS | Status: DC

## 2023-10-29 NOTE — Progress Notes (Unsigned)
 History and Physical Interval Note:  10/29/2023 3:36 PM  Lacey Jensen  has presented today for endoscopic procedure(s), with the diagnosis of  Encounter Diagnosis  Name Primary?   Change in bowel habits Yes  .  The various methods of evaluation and treatment have been discussed with the patient and/or family. After consideration of risks, benefits and other options for treatment, the patient has consented to  the endoscopic procedure(s).   The patient's history has been reviewed, patient examined, no change in status, stable for endoscopic procedure(s).  I have reviewed the patient's chart and labs.  Questions were answered to the patient's satisfaction.     Lupita CHARLENA Commander, MD, NOLIA

## 2023-10-29 NOTE — Patient Instructions (Addendum)
 There was a very tiny 1 to 2 mm polyp removed.  It has nothing to do with your symptoms.  I do see hemorrhoids still, this is not uncommon after hemorrhoid surgery sometimes they do not get them all.  Looking at your operative note they took 2 out of the 3 hemorrhoid columns (we will have 3).  I think that is where the bleeding came from.   I also took rectal biopsies to check for inflammation in the rectum it could be related to your problems though it is probably not an issue I wanted to check just in case.  Once I have the pathology results I will let you know those and any recommendations and plans.  No pinworms or other worms.  I appreciate the opportunity to care for you. Lupita CHARLENA Commander, MD, [facility]  Thank you for letting us  care for you today! Please see handouts regarding Polyps and Hemorrhoids. Resume previous diet. Continue current medications. Await pathology results.   YOU HAD AN ENDOSCOPIC PROCEDURE TODAY AT THE [facility]:   Refer to the procedure report that was given to you for any specific questions about what was found during the examination.  If the procedure report does not answer your questions, please call your gastroenterologist to clarify.  If you requested that your care partner not be given the details of your procedure findings, then the procedure report has been included in a sealed envelope for you to review at your convenience later.  YOU SHOULD EXPECT: Some feelings of bloating in the abdomen. Passage of more gas than usual.  Walking can help get rid of the air that was put into your GI tract during the procedure and reduce the bloating. If you had a lower endoscopy (such as a colonoscopy or flexible sigmoidoscopy) you may notice spotting of blood in your stool or on the toilet paper. If you underwent a bowel prep for your procedure, you may not have a normal bowel movement for a few days.  Please Note:  You might notice some irritation and congestion in  your nose or some drainage.  This is from the oxygen used during your procedure.  There is no need for concern and it should clear up in a day or so.  SYMPTOMS TO REPORT IMMEDIATELY:  Following lower endoscopy (colonoscopy or flexible sigmoidoscopy):  Excessive amounts of blood in the stool  Significant tenderness or worsening of abdominal pains  Swelling of the abdomen that is new, acute  Fever of 100F or higher  For urgent or emergent issues, a gastroenterologist can be reached at any hour by calling [phone]. Do not use MyChart messaging for urgent concerns.    DIET:  We do recommend a small meal at first, but then you may proceed to your regular diet.  Drink plenty of fluids but you should avoid alcoholic beverages for 24 hours.  ACTIVITY:  You should plan to take it easy for the rest of today and you should NOT DRIVE or use heavy machinery until tomorrow (because of the sedation medicines used during the test).    FOLLOW UP: Our staff will call the number listed on your records the next business day following your procedure.  We will call around 7:15- 8:00 am to check on you and address any questions or concerns that you may have regarding the information given to you following your procedure. If we do not reach you, we will leave a message.     If any  biopsies were taken you will be contacted by phone or by letter within the next 1-3 weeks.  Please call us  at [phone] if you have not heard about the biopsies in 3 weeks.    SIGNATURES/CONFIDENTIALITY: You and/or your care partner have signed paperwork which will be entered into your electronic medical record.  These signatures attest to the fact that that the information above on your After Visit Summary has been reviewed and is understood.  Full responsibility of the confidentiality of this discharge information lies with you and/or your care-partner.

## 2023-10-29 NOTE — Progress Notes (Signed)
 Pt's states no medical or surgical changes since previsit or office visit.

## 2023-10-29 NOTE — Telephone Encounter (Signed)
 LVM and sent mychart msg informing pt of MD departure and requesting cb to r/s 11/4 Botox .

## 2023-10-29 NOTE — Progress Notes (Signed)
 Called to room to assist during endoscopic procedure.  Patient ID and intended procedure confirmed with present staff. Received instructions for my participation in the procedure from the performing physician.

## 2023-10-29 NOTE — Op Note (Addendum)
 [facility] Patient Name: Lacey Jensen Procedure Date: 10/29/2023 3:28 PM MRN: [id number] Endoscopist: Lupita FORBES Commander , MD, [id number] Age: 58 Referring MD:  Date of Birth: March 06, 1965 Gender: Female Account #: [id number] Procedure:                Colonoscopy Indications:              Rectal bleeding, Change in bowel habits Medicines:                Monitored Anesthesia Care Procedure:                Pre-Anesthesia Assessment:                           - Prior to the procedure, a History and Physical                            was performed, and patient medications and                            allergies were reviewed. The patient's tolerance of                            previous anesthesia was also reviewed. The risks                            and benefits of the procedure and the sedation                            options and risks were discussed with the patient.                            All questions were answered, and informed consent                            was obtained. Prior Anticoagulants: The patient has                            taken no anticoagulant or antiplatelet agents. ASA                            Grade Assessment: I - A normal, healthy patient.                            After reviewing the risks and benefits, the patient                            was deemed in satisfactory condition to undergo the                            procedure.                           After obtaining informed consent, the colonoscope  was passed under direct vision. Throughout the                            procedure, the patient's blood pressure, pulse, and                            oxygen saturations were monitored continuously. The                            PCF-HQ190L Colonoscope [id number] was introduced                            through the anus and advanced to the the cecum,                            identified by appendiceal orifice and  ileocecal                            valve. The colonoscopy was performed without                            difficulty. The patient tolerated the procedure                            well. The quality of the bowel preparation was                            good. The ileocecal valve, appendiceal orifice, and                            rectum were photographed. The bowel preparation                            used was SUPREP via split dose instruction. Scope In: 3:51:15 PM Scope Out: 4:07:41 PM Scope Withdrawal Time: 0 hours 11 minutes 26 seconds  Total Procedure Duration: 0 hours 16 minutes 26 seconds  Findings:                 The perianal and digital rectal examinations were                            normal.                           A 1 to 2 mm polyp was found in the cecum. The polyp                            was sessile. The polyp was removed with a cold                            biopsy forceps. Resection and retrieval were                            complete. Verification of patient identification  for the specimen was done. Estimated blood loss was                            minimal.                           A localized area of congested and                            vascular-pattern-decreased mucosa was found in the                            distal rectum. Biopsies were taken with a cold                            forceps for histology. Verification of patient                            identification for the specimen was done. Estimated                            blood loss was minimal.                           Internal hemorrhoids were found during retroflexion.                           A few sigmoid diverticula seen                           The exam was otherwise without abnormality on                            direct and retroflexion views. Complications:            No immediate complications. Estimated Blood Loss:     Estimated blood loss  was minimal. Impression:               - One 1 to 2 mm polyp in the cecum, removed with a                            cold biopsy forceps. Resected and retrieved.                           - Congested and vascular-pattern-decreased mucosa                            in the distal rectum. Biopsied.                           - Sigmoid diverticulosis                           - Internal hemorrhoids.                           - The  examination was otherwise normal on direct                            and retroflexion views. Functional rectal exam                            prior to sedation was normal. Sounds like IBS and                            hemorrhoidal bleeding. Gets back pain relived by                            defecation which is sometimes urgent, She has                            tested negative for celiac disease. 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 Our records do show stable weight.                           - Personal history of colonic polyp - 6 mm ssp                            10/2019 Recommendation:           - Patient has a contact number available for                            emergencies. The signs and symptoms of potential                            delayed complications were discussed with the                            patient. Return to normal activities tomorrow.                            Written discharge instructions were provided to the                            patient.                           - Resume previous diet.                           -  Continue present medications.                           - Await pathology results.                           - Repeat colonoscopy is recommended. The                             colonoscopy date will be determined after pathology                            results from today's exam become available for                            review.                           - Keep f/u Camie Furbish, PA-C next month Lupita FORBES Commander, MD 10/29/2023 4:27:09 PM This report has been signed electronically.

## 2023-10-29 NOTE — Progress Notes (Unsigned)
 A/o x 3, VSS, good SR's, pleased with anesthesia, report to RN

## 2023-10-30 ENCOUNTER — Telehealth: Payer: Self-pay

## 2023-10-30 NOTE — Telephone Encounter (Signed)
 Post procedure follow up call, no answer

## 2023-10-31 NOTE — Telephone Encounter (Signed)
 Pt called me back and accepted appt with Amy on 11/5 @ 2:30. She is aware Amy will only inject FDA protocol Botox  spots.

## 2023-11-04 LAB — SURGICAL PATHOLOGY

## 2023-11-05 DIAGNOSIS — G43709 Chronic migraine without aura, not intractable, without status migrainosus: Secondary | ICD-10-CM | POA: Diagnosis not present

## 2023-11-08 ENCOUNTER — Ambulatory Visit: Payer: Self-pay | Admitting: Internal Medicine

## 2023-11-08 ENCOUNTER — Encounter: Payer: Self-pay | Admitting: Internal Medicine

## 2023-11-19 ENCOUNTER — Telehealth (INDEPENDENT_AMBULATORY_CARE_PROVIDER_SITE_OTHER): Admitting: Family Medicine

## 2023-11-19 ENCOUNTER — Encounter: Payer: Self-pay | Admitting: Family Medicine

## 2023-11-19 VITALS — Temp 99.0°F | Ht 67.0 in | Wt 150.0 lb

## 2023-11-19 DIAGNOSIS — J0101 Acute recurrent maxillary sinusitis: Secondary | ICD-10-CM

## 2023-11-19 MED ORDER — AMOXICILLIN-POT CLAVULANATE 875-125 MG PO TABS: 1.0000 | ORAL_TABLET | Freq: Two times a day (BID) | ORAL | 0 refills | Status: DC

## 2023-11-19 MED ORDER — PREDNISONE 20 MG PO TABS: 20.0000 mg | ORAL_TABLET | Freq: Two times a day (BID) | ORAL | 0 refills | Status: AC

## 2023-11-19 NOTE — Progress Notes (Signed)
 Sx: headache, sinus pressure, productive cough (green mucus) 3 weeks Took Mucinex prior. No meds today.

## 2023-11-19 NOTE — Assessment & Plan Note (Signed)
 Will treat with course of augmentin  and prednisone  burst as she has responded well from thi.  Continue to push fluids and may continue OTC medications for symptoms control.  Red flags reviewed.

## 2023-11-19 NOTE — Progress Notes (Signed)
 Lacey Jensen - 58 y.o. female MRN [id number]  Date of birth: [birth year]   All issues noted in this document were discussed and addressed.  No physical exam was performed (except for noted visual exam findings with Video Visits).  I discussed the limitations of evaluation and management by telemedicine and the availability of in person appointments. The patient expressed understanding and agreed to proceed.  I connected withNAME@ on 11/19/23 at 11:30 AM EDT by a video enabled telemedicine application and verified that I am speaking with the correct person using two identifiers.  Present at visit: Velma Ku, DO Angeline LITTIE Furth   Patient Location: Home [address] [place] [address]   Provider location:   [facility]  No chief complaint on file.   HPI  Lacey Jensen is a 58 y.o. female who presents via audio/video conferencing for a telehealth visit today.  She has had sinus pain and pressure, headache, thick yellow green mucus and fatigue for a couple of days. She does have history of frequent sinus infections.  This feels similar to previous episodes.  Augmentin  has worked well for he previously.  Sometimes requires steroids.  She has not had fever or chills with this illness.    ROS:  A comprehensive ROS was completed and negative except as noted per HPI  Past Medical History:  Diagnosis Date   Abnormal Pap smear    Age 58 and age 45   Allergy     seasonal   Anxiety    Dysplasia of cervix    leep   Headache(784.0)    Hx of adenomatous colonic polyps    Immunotherapy    Migraines    Osteoporosis     Past Surgical History:  Procedure Laterality Date   COLONOSCOPY     EXCISIONAL HEMORRHOIDECTOMY     TUBAL LIGATION      Family History  Problem Relation Age of Onset   Arthritis Mother    GER disease Sister    GER disease Sister    Migraines Son    Colon cancer Neg Hx    Colon polyps Neg Hx    Esophageal cancer Neg Hx    Rectal cancer Neg Hx     Stomach cancer Neg Hx     Social History   Socioeconomic History   Marital status: Married    Spouse name: Elsie   Number of children: 2   Years of education: Not on file   Highest education level: Master's degree (e.g., MA, MS, MEng, MEd, MSW, MBA)  Occupational History   Occupation: Building control surveyor  Tobacco Use   Smoking status: Former    Current packs/day: 0.00    Types: Cigarettes    Quit date: 01/23/2004    Years since quitting: 19.8   Smokeless tobacco: Never  Vaping Use   Vaping status: Never Used  Substance and Sexual Activity   Alcohol use: Not Currently    Alcohol/week: 1.0 standard drink of alcohol    Types: 1 Standard drinks or equivalent per week   Drug use: No   Sexual activity: Yes    Birth control/protection: None    Comment: IN MENOPAUSE  Other Topics Concern   Not on file  Social History Narrative   She was adopted so we do not have an extensive family history. She does yoga for exercise and drinks 2 caffeinated drinks per day.      Lives at home with her husband & 3 dogs   Right handed  Social Drivers of Corporate Investment Banker Strain: Low Risk  (08/15/2023)   Overall Financial Resource Strain (CARDIA)    Difficulty of Paying Living Expenses: Not hard at all  Food Insecurity: No Food Insecurity (08/15/2023)   Hunger Vital Sign    Worried About Running Out of Food in the Last Year: Never true    Ran Out of Food in the Last Year: Never true  Transportation Needs: No Transportation Needs (08/15/2023)   PRAPARE - Administrator, Civil Service (Medical): No    Lack of Transportation (Non-Medical): No  Physical Activity: Insufficiently Active (08/15/2023)   Exercise Vital Sign    Days of Exercise per Week: 3 days    Minutes of Exercise per Session: 40 min  Stress: No Stress Concern Present (08/15/2023)   [employer] of Occupational Health - Occupational Stress Questionnaire    Feeling of Stress: Not at all  Social Connections:  Socially Integrated (08/15/2023)   Social Connection and Isolation Panel    Frequency of Communication with Friends and Family: More than three times a week    Frequency of Social Gatherings with Friends and Family: Three times a week    Attends Religious Services: More than 4 times per year    Active Member of Clubs or Organizations: Yes    Attends Banker Meetings: More than 4 times per year    Marital Status: Married  Catering Manager Violence: Unknown (04/24/2021)   Received from [facility]   HITS    Physically Hurt: Not on file    Insult or Talk Down To: Not on file    Threaten Physical Harm: Not on file    Scream or Curse: Not on file     Current Outpatient Medications:    botulinum toxin Type A  (BOTOX ) 100 units SOLR injection, INJECT 155 UNITS INTO THE MUSCLES OF THE HEAD AND NECK EVERY 3 MONTHS BY THE PROVIDER. DISCARD UNUSED PORTION (MUST RECONSTITUTE), Disp: 2 each, Rfl: 3   FLUoxetine  (PROZAC ) 20 MG capsule, TAKE 2 CAPSULES BY MOUTH DAILY, Disp: 180 capsule, Rfl: 0   MAGNESIUM PO, Take by mouth. , Disp: , Rfl:    NURTEC 75 MG TBDP, DISSOLVE ONE TABLET BY MOUTH AT ONSET OF HEADACHE AS NEEDED FOR MIGRAINES, MAXIMUM DAILY DOSE IS 1 TABLET, Disp: 16 tablet, Rfl: 3   predniSONE  (DELTASONE ) 20 MG tablet, Take 1 tablet (20 mg total) by mouth 2 (two) times daily with a meal for 5 days., Disp: 10 tablet, Rfl: 0   ALPRAZolam  (XANAX ) 1 MG tablet, Take 0.5-1 tablets (0.5-1 mg total) by mouth 3 (three) times daily as needed for anxiety., Disp: 90 tablet, Rfl: 1   amoxicillin -clavulanate (AUGMENTIN ) 875-125 MG tablet, Take 1 tablet by mouth 2 (two) times daily. For 10 days., Disp: 20 tablet, Rfl: 0   gabapentin  (NEURONTIN ) 100 MG capsule, TAKE 1 TO 3 CAPSULES(100 TO 300 MG) BY MOUTH AT BEDTIME, Disp: 270 capsule, Rfl: 4   methylphenidate  (CONCERTA ) 27 MG PO CR tablet, Take 1 tablet (27 mg total) by mouth every morning. (Patient not taking: Reported on 10/29/2023), Disp: 30  tablet, Rfl: 0  EXAM:  VITALS per patient if applicable: Temp 99 F (37.2 C)   Ht 5' 7 (1.702 m)   Wt 150 lb (68 kg)   LMP 11/23/2015   BMI 23.49 kg/m   GENERAL: alert, oriented, appears well and in no acute distress  HEENT: atraumatic, conjunttiva clear, no obvious abnormalities on inspection of external  nose and ears  NECK: normal movements of the head and neck  LUNGS: on inspection no signs of respiratory distress, breathing rate appears normal, no obvious gross SOB, gasping or wheezing  CV: no obvious cyanosis  MS: moves all visible extremities without noticeable abnormality  PSYCH/NEURO: pleasant and cooperative, no obvious depression or anxiety, speech and thought processing grossly intact  ASSESSMENT AND PLAN:  Discussed the following assessment and plan:  Acute recurrent maxillary sinusitis Will treat with course of augmentin  and prednisone  burst as she has responded well from thi.  Continue to push fluids and may continue OTC medications for symptoms control.  Red flags reviewed.       I discussed the assessment and treatment plan with the patient. The patient was provided an opportunity to ask questions and all were answered. The patient agreed with the plan and demonstrated an understanding of the instructions.   The patient was advised to call back or seek an in-person evaluation if the symptoms worsen or if the condition fails to improve as anticipated.    Velma Ku, DO

## 2023-11-25 NOTE — Progress Notes (Deleted)
 11/25/23 ALL: Lacey Jensen returns for Botox . Last procedure with Dr Ines 08/2023. Gabapentin ? Nurtec?    Consent Form Botulism Toxin Injection For Chronic Migraine    Reviewed orally with patient, additionally signature is on file:  Botulism toxin has been approved by the Federal drug administration for treatment of chronic migraine. Botulism toxin does not cure chronic migraine and it may not be effective in some patients.  The administration of botulism toxin is accomplished by injecting a small amount of toxin into the muscles of the neck and head. Dosage must be titrated for each individual. Any benefits resulting from botulism toxin tend to wear off after 3 months with a repeat injection required if benefit is to be maintained. Injections are usually done every 3-4 months with maximum effect peak achieved by about 2 or 3 weeks. Botulism toxin is expensive and you should be sure of what costs you will incur resulting from the injection.  The side effects of botulism toxin use for chronic migraine may include:   -Transient, and usually mild, facial weakness with facial injections  -Transient, and usually mild, head or neck weakness with head/neck injections  -Reduction or loss of forehead facial animation due to forehead muscle weakness  -Eyelid drooping  -Dry eye  -Pain at the site of injection or bruising at the site of injection  -Double vision  -Potential unknown long term risks   Contraindications: You should not have Botox  if you are pregnant, nursing, allergic to albumin, have an infection, skin condition, or muscle weakness at the site of the injection, or have myasthenia gravis, Lambert-Eaton syndrome, or ALS.  It is also possible that as with any injection, there may be an allergic reaction or no effect from the medication. Reduced effectiveness after repeated injections is sometimes seen and rarely infection at the injection site may occur. All care will be taken to prevent  these side effects. If therapy is given over a long time, atrophy and wasting in the muscle injected may occur. Occasionally the patient's become refractory to treatment because they develop antibodies to the toxin. In this event, therapy needs to be modified.  I have read the above information and consent to the administration of botulism toxin.    BOTOX  PROCEDURE NOTE FOR MIGRAINE HEADACHE  Contraindications and precautions discussed with patient(above). Aseptic procedure was observed and patient tolerated procedure. Procedure performed by Greig Forbes, FNP-C.   The condition has existed for more than 6 months, and pt does not have a diagnosis of ALS, Myasthenia Gravis or Lambert-Eaton Syndrome.  Risks and benefits of injections discussed and pt agrees to proceed with the procedure.  Written consent obtained  These injections are medically necessary. Pt  receives good benefits from these injections. These injections do not cause sedations or hallucinations which the oral therapies may cause.   Description of procedure:  The patient was placed in a sitting position. The standard protocol was used for Botox  as follows, with 5 units of Botox  injected at each site:  -Procerus muscle, midline injection  -Corrugator muscle, bilateral injection  -Frontalis muscle, bilateral injection, with 2 sites each side, medial injection was performed in the upper one third of the frontalis muscle, in the region vertical from the medial inferior edge of the superior orbital rim. The lateral injection was again in the upper one third of the forehead vertically above the lateral limbus of the cornea, 1.5 cm lateral to the medial injection site.  -Temporalis muscle injection, 4 sites, bilaterally. The  first injection was 3 cm above the tragus of the ear, second injection site was 1.5 cm to 3 cm up from the first injection site in line with the tragus of the ear. The third injection site was 1.5-3 cm forward between  the first 2 injection sites. The fourth injection site was 1.5 cm posterior to the second injection site. 5th site laterally in the temporalis  muscleat the level of the outer canthus.  -Occipitalis muscle injection, 3 sites, bilaterally. The first injection was done one half way between the occipital protuberance and the tip of the mastoid process behind the ear. The second injection site was done lateral and superior to the first, 1 fingerbreadth from the first injection. The third injection site was 1 fingerbreadth superiorly and medially from the first injection site.  -Cervical paraspinal muscle injection, 2 sites, bilaterally. The first injection site was 1 cm from the midline of the cervical spine, 3 cm inferior to the lower border of the occipital protuberance. The second injection site was 1.5 cm superiorly and laterally to the first injection site.  -Trapezius muscle injection was performed at 3 sites, bilaterally. The first injection site was in the upper trapezius muscle halfway between the inflection point of the neck, and the acromion. The second injection site was one half way between the acromion and the first injection site. The third injection was done between the first injection site and the inflection point of the neck.   Will return for repeat injection in 3 months.   A total of 200 units of Botox  was prepared, 155 units of Botox  was injected as documented above, any Botox  not injected was wasted. The patient tolerated the procedure well, there were no complications of the above procedure.

## 2023-11-26 ENCOUNTER — Ambulatory Visit: Admitting: Neurology

## 2023-11-27 ENCOUNTER — Ambulatory Visit: Admitting: Family Medicine

## 2023-11-27 ENCOUNTER — Encounter: Payer: Self-pay | Admitting: Family Medicine

## 2023-11-27 DIAGNOSIS — G43711 Chronic migraine without aura, intractable, with status migrainosus: Secondary | ICD-10-CM

## 2023-11-28 NOTE — Progress Notes (Signed)
 Lacey Jensen [id number] 1965/05/21   Chief Complaint: Constipation, mucus and blood in stool, difficulty with bowel movements  Referring Provider: Alvan Dorothyann BIRCH, * Primary GI MD: Dr. Avram  HPI: Lacey Jensen is a 58 y.o. female with past medical history of dysplasia of cervix s/p LEEP, osteoporosis who presents today for follow up.  Visit with OB/GYN 08/05/2023, patient reported increased frequency of bowel movements, blood in stool, unintentional weight loss.   Seen by PCP 08/15/2023.  Labs showed normal CBC, negative Saccharomyces cerevisiae IgG and IgA, negative celiac disease, negative inflammatory markers.  Has also recently had a normal TSH and normal CMP.  Seen in office 10/10/2023 at which time reported 6 months of alternating bowel habits, occasional mucus in stool or sometimes passing only mucus with fecal urgency, some diarrhea about once every other week, rarely any watery stools.  Had been having some low back pain prior to bowel movements or prior to urination, relieved by both.  In the last 3 months had noticed intermittent bright red rectal bleeding happening a couple times a week.  Blood on the toilet paper as well as in the stool itself.  History of internal and external hemorrhoids for which she had hemorrhoidectomy in 2022 and had been doing well until recently.  Endorsed fatigue over the last 8 months and reported weight loss of 20 pounds, though per chart review seems weight had been stable over the last 2 years.  Labs at that time normal without any evidence of anemia or infection.  She underwent colonoscopy 10/29/2023 with finding of a tubular adenoma and possible findings of solitary rectal ulcer syndrome changes in the rectum, with recommended recall in 7 years.     Discussed the use of AI scribe software for clinical note transcription with the patient, who gave verbal consent to proceed.  History of Present Illness Lacey Jensen is a 58 year  old female who presents with ongoing bowel irregularities and rectal bleeding.  Bowel habit irregularities - Alternating constipation and diarrhea since hemorrhoidectomy approximately four years ago - Periods of multiple days without a bowel movement, alternating with days of multiple bowel movements - Unpredictable bowel habits impact daily life and appetite - No use of laxatives or stool softeners since hemorrhoidectomy - No engagement in pelvic floor physical therapy - Not on a high fiber diet  Rectal bleeding - Occasional rectal bleeding, especially after prolonged constipation - Onset of symptoms associated with hemorrhoidectomy  - Recent colonoscopy showed inflammation in the rectum  Fatigue - Significant fatigue attributed to bowel irregularities and impact daily life  Back pain - Back pain correlates with bowel irregularities, particularly during episodes of constipation  Hydration status - Hydration habits could be improved   Previous GI Procedures/Imaging   Colonoscopy 10/29/2023 - One 1 to 2 mm polyp in the cecum, removed with a cold biopsy forceps. Resected and retrieved.  - Congested and vascular- pattern- decreased mucosa in the distal rectum. Biopsied.  - Sigmoid diverticulosis  - Internal hemorrhoids.  - The examination was otherwise normal on direct and retroflexion views. Functional rectal exam prior to sedation was normal. Sounds like IBS and hemorrhoidal bleeding. Gets back pain relived by defecation which is sometimes urgent, She has tested negative for celiac disease. 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 Our records do show stable weight.  - Personal history  of colonic polyp - 6 mm ssp 10/ 2021 - Recall 7 years Path: 1. Surgical [P], colon, sigmoid, polyp (1) :       - TUBULAR ADENOMA.       - NO HIGH GRADE DYSPLASIA OR  MALIGNANCY.        2. 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   Colonoscopy 11/10/2019 - One 6 mm polyp in the cecum, removed with a cold snare. Resected and retrieved.  - Mild sigmoid diverticulosis.  - Non- bleeding internal hemorrhoids.  - The examined portion of the ileum was normal. - The examination was otherwise normal on direct and retroflexion views. - Recall 5 years Path: Surgical [P], colon, cecum, polyp - SESSILE SERRATED POLYP WITHOUT CYTOLOGIC DYSPLASIA. - NO EVIDENCE OF CARCINOMA.   Past Medical History:  Diagnosis Date   Abnormal Pap smear    Age 31 and age 72   Allergy     seasonal   Anxiety    Dysplasia of cervix    leep   Headache(784.0)    Hx of adenomatous colonic polyps    Immunotherapy    Migraines    Osteoporosis     Past Surgical History:  Procedure Laterality Date   COLONOSCOPY     EXCISIONAL HEMORRHOIDECTOMY     TUBAL LIGATION      Current Outpatient Medications  Medication Sig Dispense Refill   ALPRAZolam  (XANAX ) 1 MG tablet Take 0.5-1 tablets (0.5-1 mg total) by mouth 3 (three) times daily as needed for anxiety. 90 tablet 1   amoxicillin -clavulanate (AUGMENTIN ) 875-125 MG tablet Take 1 tablet by mouth 2 (two) times daily. For 10 days. 20 tablet 0   botulinum toxin Type A  (BOTOX ) 100 units SOLR injection INJECT 155 UNITS INTO THE MUSCLES OF THE HEAD AND NECK EVERY 3 MONTHS BY THE PROVIDER. DISCARD UNUSED PORTION (MUST RECONSTITUTE) 2 each 3   FLUoxetine  (PROZAC ) 20 MG capsule TAKE 2 CAPSULES BY MOUTH DAILY 180 capsule 0   gabapentin  (NEURONTIN ) 100 MG capsule TAKE 1 TO 3 CAPSULES(100 TO 300 MG) BY MOUTH AT BEDTIME 270 capsule 4   MAGNESIUM PO Take by mouth.      methylphenidate   (CONCERTA ) 27 MG PO CR tablet Take 1 tablet (27 mg total) by mouth every morning. 30 tablet 0   NURTEC 75 MG TBDP DISSOLVE ONE TABLET BY MOUTH AT ONSET OF HEADACHE AS NEEDED FOR MIGRAINES, MAXIMUM DAILY DOSE IS 1 TABLET 16 tablet 3   No current facility-administered medications for this visit.    Allergies as of 11/29/2023 - Review Complete 11/29/2023  Allergen Reaction Noted   Ambien  [zolpidem ] Other (See Comments) 05/22/2016   Belsomra  [suvorexant ] Other (See Comments) 06/08/2016   Sumatriptan succinate Other (See Comments) 04/04/2010   Zolmitriptan Other (See Comments) 04/04/2010    Family History  Problem Relation Age of Onset   Arthritis Mother    GER disease Sister    GER disease Sister    Migraines Son    Colon cancer Neg Hx    Colon polyps Neg Hx    Esophageal cancer Neg Hx    Rectal cancer Neg Hx    Stomach cancer Neg Hx  Social History   Tobacco Use   Smoking status: Former    Current packs/day: 0.00    Types: Cigarettes    Quit date: 01/23/2004    Years since quitting: 19.8   Smokeless tobacco: Never  Vaping Use   Vaping status: Never Used  Substance Use Topics   Alcohol use: Not Currently    Alcohol/week: 1.0 standard drink of alcohol    Types: 1 Standard drinks or equivalent per week   Drug use: No     Review of Systems:    Constitutional: No weight loss, fever, chills Cardiovascular: No chest pain Respiratory: No SOB  Gastrointestinal: See HPI and otherwise negative    Physical Exam:  Vital signs: BP 110/68   Pulse 61   Ht 5' 9 (1.753 m)   Wt 150 lb 8 oz (68.3 kg)   LMP 11/23/2015   SpO2 98%   BMI 22.22 kg/m   Wt Readings from Last 3 Encounters:  11/29/23 150 lb 8 oz (68.3 kg)  11/19/23 150 lb (68 kg)  10/29/23 151 lb (68.5 kg)    Constitutional: Pleasant, well-appearing female in NAD, alert and cooperative Head:  Normocephalic and atraumatic.  Eyes: No scleral icterus.  Respiratory: Respirations even and unlabored. Lungs clear to  auscultation bilaterally.  No wheezes, crackles, or rhonchi.  Cardiovascular:  Regular rate and rhythm. No murmurs. No peripheral edema. Gastrointestinal:  Soft, nondistended, nontender. No rebound or guarding. Normal bowel sounds. No appreciable masses or hepatomegaly. Rectal:  Not performed.  Neurologic:  Alert and oriented x4;  grossly normal neurologically.  Skin:   Dry and intact without significant lesions or rashes. Psychiatric: Oriented to person, place and time. Demonstrates good judgement and reason without abnormal affect or behaviors.   RELEVANT LABS AND IMAGING: CBC    Component Value Date/Time   WBC 4.1 10/10/2023 1101   RBC 4.36 10/10/2023 1101   HGB 13.0 10/10/2023 1101   HGB 11.8 08/15/2023 1627   HCT 39.7 10/10/2023 1101   HCT 37.2 08/15/2023 1627   PLT 239.0 10/10/2023 1101   PLT 212 08/15/2023 1627   MCV 91.1 10/10/2023 1101   MCV 94 08/15/2023 1627   MCH 29.7 08/15/2023 1627   MCH 30.0 06/04/2017 1627   MCHC 32.8 10/10/2023 1101   RDW 13.5 10/10/2023 1101   RDW 12.4 08/15/2023 1627   LYMPHSABS 1.8 10/10/2023 1101   LYMPHSABS 2.0 08/15/2023 1627   MONOABS 0.5 10/10/2023 1101   EOSABS 0.1 10/10/2023 1101   EOSABS 0.6 (H) 08/15/2023 1627   BASOSABS 0.0 10/10/2023 1101   BASOSABS 0.0 08/15/2023 1627    CMP     Component Value Date/Time   NA 140 08/08/2023 0949   K 4.4 08/08/2023 0949   CL 104 08/08/2023 0949   CO2 23 08/08/2023 0949   GLUCOSE 94 08/08/2023 0949   GLUCOSE 91 09/17/2012 0953   BUN 20 08/08/2023 0949   CREATININE 0.62 08/08/2023 0949   CREATININE 0.66 09/17/2012 0953   CALCIUM 9.7 08/08/2023 0949   PROT 6.7 08/08/2023 0949   ALBUMIN 4.4 08/08/2023 0949   AST 30 08/08/2023 0949   ALT 26 08/08/2023 0949   ALKPHOS 114 08/08/2023 0949   BILITOT 0.3 08/08/2023 0949   GFRNONAA 102 06/04/2018 1026   GFRNONAA >89 09/17/2012 0953   GFRAA 117 06/04/2018 1026   GFRAA >89 09/17/2012 0953     Assessment/Plan:   Assessment &  Plan Alternating constipation and diarrhea Rectal bleeding 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  Onset after hemorrhoidectomy 4 years ago.  Colonoscopy showed rectal inflammation without ulcers. Biopsies indicated nonspecific active colitis. 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   - Initiate high fiber diet - Consider addition of fiber supplement such as Benefiber, 1 tablespoon daily - Refer to pelvic floor physical therapy  - Advised on general toileting habits: limit time on the toilet, avoid straining, and ensure adequate hydration  Low back pain Intermittent low back pain, possibly related to bowel dysfunction and constipation. Pain recurs after periods of constipation.  States that for 2 weeks following colonoscopy she had no low back pain, but since then has returned.  - Advised use of Miralax if experiencing constipation or hard stools - Fiber supplementation and pelvic floor PT as above    Camie Furbish, PA-C  Gastroenterology 11/29/2023, 11:38 AM  Patient Care Team: Alvan Dorothyann BIRCH, MD as PCP - General Cris Burnard DEL, MD as Consulting Physician (Obstetrics and Gynecology)

## 2023-11-29 ENCOUNTER — Encounter: Payer: Self-pay | Admitting: Gastroenterology

## 2023-11-29 ENCOUNTER — Ambulatory Visit: Admitting: Gastroenterology

## 2023-11-29 VITALS — BP 110/68 | HR 61 | Ht 69.0 in | Wt 150.5 lb

## 2023-11-29 DIAGNOSIS — R198 Other specified symptoms and signs involving the digestive system and abdomen: Secondary | ICD-10-CM

## 2023-11-29 DIAGNOSIS — R152 Fecal urgency: Secondary | ICD-10-CM

## 2023-11-29 DIAGNOSIS — R195 Other fecal abnormalities: Secondary | ICD-10-CM

## 2023-11-29 DIAGNOSIS — K626 Ulcer of anus and rectum: Secondary | ICD-10-CM

## 2023-11-29 DIAGNOSIS — R5383 Other fatigue: Secondary | ICD-10-CM | POA: Diagnosis not present

## 2023-11-29 DIAGNOSIS — K625 Hemorrhage of anus and rectum: Secondary | ICD-10-CM

## 2023-11-29 NOTE — Patient Instructions (Addendum)
 Take one tablespoon of Benefiber daily  Increase water intake.  You have been referred for pelvic floor therapy  High-Fiber Eating Plan Fiber, also called dietary fiber, is found in foods such as fruits, vegetables, whole grains, and beans. A high-fiber diet can be good for your health. Your health care provider may recommend a high-fiber diet to help: Prevent trouble pooping (constipation). Lower your cholesterol. Treat the following conditions: Hemorrhoids. This is inflammation of veins in the anus. Inflammation of specific areas of the digestive tract. Irritable bowel syndrome (IBS). This is a problem of the large intestine, also called the colon, that sometimes causes belly pain and bloating. Prevent overeating as part of a weight-loss plan. Lower the risk of heart disease, type 2 diabetes, and certain cancers. What are tips for following this plan? Reading food labels  Check the nutrition facts label on foods for the amount of dietary fiber. Choose foods that have 4 grams of fiber or more per serving. The recommended goals for how much fiber you should eat each day include: Males 12 years old or younger: 30-34 g. Males over 25 years old: 28-34 g. Females 59 years old or younger: 25-28 g. Females over 50 years old: 22-25 g. Your daily fiber goal is _____________ g. Shopping Choose whole fruits and vegetables instead of processed. For example, choose apples instead of apple juice or applesauce. Choose a variety of high-fiber foods such as avocados, lentils, oats, and pinto beans. Read the nutrition facts label on foods. Check for foods with added fiber. These foods often have high sugar and salt (sodium) amounts per serving. Cooking Use whole-grain flour for baking and cooking. Cook with brown rice instead of white rice. Make meals that have a lot of beans and vegetables in them, such as chili or vegetable-based soups. Meal planning Start the day with a breakfast that is high  in fiber, such as a cereal that has 5 g of fiber or more per serving. Eat breads and cereals that are made with whole-grain flour instead of refined flour or white flour. Eat brown rice, bulgur wheat, or millet instead of white rice. Use beans in place of meat in soups, salads, and pasta dishes. Be sure that half of the grains you eat each day are whole grains. General information You can get the recommended amount of dietary fiber by: Eating a variety of fruits, vegetables, grains, nuts, and beans. Taking a fiber supplement if you aren't able to eat enough fiber. It's better to get fiber through food than from a supplement. Slowly increase how much fiber you eat. If you increase the amount of fiber you eat too quickly, you may have bloating, cramping, or gas. Drink plenty of water to help you digest fiber. Choose high-fiber snacks, such as berries, raw vegetables, nuts, and popcorn. What foods should I eat? Fruits Berries. Pears. Apples. Oranges. Avocado. Prunes and raisins. Dried figs. Vegetables Sweet potatoes. Spinach. Kale. Artichokes. Cabbage. Broccoli. Cauliflower. Green peas. Carrots. Squash. Grains Whole-grain breads. Multigrain cereal. Oats and oatmeal. Brown rice. Barley. Bulgur wheat. Millet. Quinoa. Bran muffins. Popcorn. Rye wafer crackers. Meats and other proteins Navy beans, kidney beans, and pinto beans. Soybeans. Split peas. Lentils. Nuts and seeds. Dairy Fiber-fortified yogurt. Fortified means that fiber has been added to the product. Beverages Fiber-fortified soy milk. Fiber-fortified orange juice. Other foods Fiber bars. The items listed above may not be all the foods and drinks you can have. Talk to a dietitian to learn more. What foods should I avoid?  Fruits Fruit juice. Cooked, strained fruit. Vegetables Fried potatoes. Canned vegetables. Well-cooked vegetables. Grains White bread. Pasta made with refined flour. White rice. Meats and other proteins Fatty  meat. Fried chicken or fried fish. Dairy Milk. Cream cheese. Sour cream. Fats and oils Butters. Beverages Soft drinks. Other foods Cakes and pastries. The items listed above may not be all the foods and drinks you should avoid. Talk to a dietitian to learn more. This information is not intended to replace advice given to you by your health care provider. Make sure you discuss any questions you have with your health care provider. Document Revised: 04/02/2022 Document Reviewed: 04/02/2022 Elsevier Patient Education  2024 [employer].

## 2023-11-30 ENCOUNTER — Encounter: Payer: Self-pay | Admitting: Gastroenterology

## 2023-12-02 ENCOUNTER — Encounter: Payer: Self-pay | Admitting: Family Medicine

## 2023-12-02 ENCOUNTER — Ambulatory Visit: Admitting: Family Medicine

## 2023-12-02 DIAGNOSIS — G43711 Chronic migraine without aura, intractable, with status migrainosus: Secondary | ICD-10-CM

## 2023-12-02 MED ORDER — ONABOTULINUMTOXINA 100 UNITS IJ SOLR
155.0000 [IU] | Freq: Once | INTRAMUSCULAR | Status: AC
  Administered 2023-12-02: 155 [IU] via INTRAMUSCULAR

## 2023-12-02 NOTE — Progress Notes (Signed)
 Botox - 100 units x 2 vials Lot: [id number] Expiration: 2027/12 NDC: 9976-8854-98  Bacteriostatic 0.9% Sodium Chloride - 4 mL  Lot: [id number] Expiration: 11/21/24 NDC: 9590803397  Dx:  H56.288   S/P  Witnessed by [facility]

## 2023-12-02 NOTE — Progress Notes (Signed)
 12/02/23 ALL: Lacey Jensen returns for Botox . Last procedure with Dr Ines 08/2023. Migraines are well managed. She does note worsening the week prior to procedure. Gabapentin  and Nurtec as needed work well. She has been given Xanax  for anxiety/sleep aid. She usually takes 1/2 tablet 23 times a week. I have asked her to discuss continued refills with PCP.     Consent Form Botulism Toxin Injection For Chronic Migraine    Reviewed orally with patient, additionally signature is on file:  Botulism toxin has been approved by the Federal drug administration for treatment of chronic migraine. Botulism toxin does not cure chronic migraine and it may not be effective in some patients.  The administration of botulism toxin is accomplished by injecting a small amount of toxin into the muscles of the neck and head. Dosage must be titrated for each individual. Any benefits resulting from botulism toxin tend to wear off after 3 months with a repeat injection required if benefit is to be maintained. Injections are usually done every 3-4 months with maximum effect peak achieved by about 2 or 3 weeks. Botulism toxin is expensive and you should be sure of what costs you will incur resulting from the injection.  The side effects of botulism toxin use for chronic migraine may include:   -Transient, and usually mild, facial weakness with facial injections  -Transient, and usually mild, head or neck weakness with head/neck injections  -Reduction or loss of forehead facial animation due to forehead muscle weakness  -Eyelid drooping  -Dry eye  -Pain at the site of injection or bruising at the site of injection  -Double vision  -Potential unknown long term risks   Contraindications: You should not have Botox  if you are pregnant, nursing, allergic to albumin, have an infection, skin condition, or muscle weakness at the site of the injection, or have myasthenia gravis, Lambert-Eaton syndrome, or ALS.  It is also  possible that as with any injection, there may be an allergic reaction or no effect from the medication. Reduced effectiveness after repeated injections is sometimes seen and rarely infection at the injection site may occur. All care will be taken to prevent these side effects. If therapy is given over a long time, atrophy and wasting in the muscle injected may occur. Occasionally the patient's become refractory to treatment because they develop antibodies to the toxin. In this event, therapy needs to be modified.  I have read the above information and consent to the administration of botulism toxin.    BOTOX  PROCEDURE NOTE FOR MIGRAINE HEADACHE  Contraindications and precautions discussed with patient(above). Aseptic procedure was observed and patient tolerated procedure. Procedure performed by Greig Forbes, FNP-C.   The condition has existed for more than 6 months, and pt does not have a diagnosis of ALS, Myasthenia Gravis or Lambert-Eaton Syndrome.  Risks and benefits of injections discussed and pt agrees to proceed with the procedure.  Written consent obtained  These injections are medically necessary. Pt  receives good benefits from these injections. These injections do not cause sedations or hallucinations which the oral therapies may cause.   Description of procedure:  The patient was placed in a sitting position. The standard protocol was used for Botox  as follows, with 5 units of Botox  injected at each site:  -Procerus muscle, midline injection  -Corrugator muscle, bilateral injection  -Frontalis muscle, bilateral injection, with 2 sites each side, medial injection was performed in the upper one third of the frontalis muscle, in the region vertical from  the medial inferior edge of the superior orbital rim. The lateral injection was again in the upper one third of the forehead vertically above the lateral limbus of the cornea, 1.5 cm lateral to the medial injection site.  -Temporalis  muscle injection, 4 sites, bilaterally. The first injection was 3 cm above the tragus of the ear, second injection site was 1.5 cm to 3 cm up from the first injection site in line with the tragus of the ear. The third injection site was 1.5-3 cm forward between the first 2 injection sites. The fourth injection site was 1.5 cm posterior to the second injection site. 5th site laterally in the temporalis  muscleat the level of the outer canthus.  -Occipitalis muscle injection, 3 sites, bilaterally. The first injection was done one half way between the occipital protuberance and the tip of the mastoid process behind the ear. The second injection site was done lateral and superior to the first, 1 fingerbreadth from the first injection. The third injection site was 1 fingerbreadth superiorly and medially from the first injection site.  -Cervical paraspinal muscle injection, 2 sites, bilaterally. The first injection site was 1 cm from the midline of the cervical spine, 3 cm inferior to the lower border of the occipital protuberance. The second injection site was 1.5 cm superiorly and laterally to the first injection site.  -Trapezius muscle injection was performed at 3 sites, bilaterally. The first injection site was in the upper trapezius muscle halfway between the inflection point of the neck, and the acromion. The second injection site was one half way between the acromion and the first injection site. The third injection was done between the first injection site and the inflection point of the neck.   Will return for repeat injection in 3 months.   A total of 200 units of Botox  was prepared, 155 units of Botox  was injected as documented above, any Botox  not injected was wasted. The patient tolerated the procedure well, there were no complications of the above procedure.

## 2023-12-06 ENCOUNTER — Other Ambulatory Visit: Payer: Self-pay | Admitting: *Deleted

## 2023-12-06 ENCOUNTER — Telehealth: Payer: Self-pay | Admitting: *Deleted

## 2023-12-06 DIAGNOSIS — F418 Other specified anxiety disorders: Secondary | ICD-10-CM

## 2023-12-09 NOTE — Telephone Encounter (Signed)
 Per chart specialty comments, [facility] already reassigned her to Dr. Chalice. Please call her to set up an ov, thank you.

## 2023-12-09 NOTE — Telephone Encounter (Signed)
 Called and LVM for pt to call back and schedule an appt to establish with reassigned provider.

## 2023-12-10 ENCOUNTER — Telehealth: Payer: Self-pay | Admitting: Pharmacist

## 2023-12-10 ENCOUNTER — Other Ambulatory Visit (HOSPITAL_COMMUNITY): Payer: Self-pay

## 2023-12-10 NOTE — Telephone Encounter (Signed)
 ERROR

## 2023-12-11 ENCOUNTER — Other Ambulatory Visit (HOSPITAL_COMMUNITY): Payer: Self-pay

## 2023-12-11 ENCOUNTER — Telehealth: Payer: Self-pay

## 2023-12-11 NOTE — Telephone Encounter (Signed)
 Pharmacy Patient Advocate Encounter   Received notification from Fax that prior authorization for Nurtec 75mg  ODT is required/requested.   Insurance verification completed.   The patient is insured through [facility].   Per test claim: PA required; PA submitted to above mentioned insurance via Fax Key/confirmation #/EOC [id number] Status is pending

## 2023-12-11 NOTE — Telephone Encounter (Signed)
   I will submit a faxed PA Form-[employer] sometimes will say on CMM no PA required but it does require a PA.

## 2023-12-13 ENCOUNTER — Other Ambulatory Visit (HOSPITAL_COMMUNITY): Payer: Self-pay

## 2023-12-13 NOTE — Telephone Encounter (Signed)
 Pharmacy Patient Advocate Encounter  Received notification from [facility] that Prior Authorization for Nurtec has been APPROVED from 12/11/2023 to 12/10/2024. Ran test claim, Copay is $0. This test claim was processed through [facility]- copay amounts may vary at other pharmacies due to pharmacy/plan contracts, or as the patient moves through the different stages of their insurance plan.   PA #/Case ID/Reference #: [id number]

## 2024-01-06 ENCOUNTER — Ambulatory Visit: Admitting: Obstetrics & Gynecology

## 2024-01-06 ENCOUNTER — Telehealth: Payer: Self-pay | Admitting: *Deleted

## 2024-01-06 NOTE — Telephone Encounter (Signed)
 Returned call from 1:09 PM. Left patient a message that appointment has been canceled per request.

## 2024-02-04 ENCOUNTER — Ambulatory Visit: Admitting: Physical Therapy

## 2024-02-12 ENCOUNTER — Other Ambulatory Visit: Payer: Self-pay | Admitting: *Deleted

## 2024-02-12 DIAGNOSIS — F419 Anxiety disorder, unspecified: Secondary | ICD-10-CM

## 2024-02-12 MED ORDER — FLUOXETINE HCL 20 MG PO CAPS: 40.0000 mg | ORAL_CAPSULE | Freq: Every day | ORAL | 0 refills | Status: AC

## 2024-02-25 ENCOUNTER — Ambulatory Visit: Admitting: Family Medicine

## 2024-02-25 ENCOUNTER — Encounter: Payer: Self-pay | Admitting: Family Medicine

## 2024-02-25 VITALS — BP 118/78 | HR 79 | Ht 69.0 in | Wt 156.0 lb

## 2024-02-25 DIAGNOSIS — G43711 Chronic migraine without aura, intractable, with status migrainosus: Secondary | ICD-10-CM | POA: Diagnosis not present

## 2024-02-25 MED ORDER — ONABOTULINUMTOXINA 100 UNITS IJ SOLR
100.0000 [IU] | Freq: Once | INTRAMUSCULAR | Status: AC
  Administered 2024-02-25: 155 [IU] via INTRAMUSCULAR

## 2024-02-25 MED ORDER — NURTEC 75 MG PO TBDP: 75.0000 mg | ORAL_TABLET | Freq: Every day | ORAL | 11 refills | Status: AC | PRN

## 2024-03-05 ENCOUNTER — Ambulatory Visit: Admitting: Family Medicine

## 2024-05-06 ENCOUNTER — Ambulatory Visit: Admitting: Neurology

## 2024-05-07 ENCOUNTER — Ambulatory Visit: Admitting: Neurology

## 2024-06-02 ENCOUNTER — Ambulatory Visit: Admitting: Family Medicine
